# Patient Record
Sex: Female | Born: 1980 | Race: White | Hispanic: No | Marital: Single | Smoking: Never smoker
Health system: Southern US, Community
[De-identification: ages and names within clinical notes are randomized; demographics above are authoritative.]

## PROBLEM LIST (undated history)

## (undated) DIAGNOSIS — I10 Essential (primary) hypertension: Secondary | ICD-10-CM

## (undated) DIAGNOSIS — E119 Type 2 diabetes mellitus without complications: Secondary | ICD-10-CM

## (undated) HISTORY — PX: TONSILLECTOMY: SUR1361

---
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
## 2000-04-06 ENCOUNTER — Inpatient Hospital Stay (HOSPITAL_COMMUNITY): Admission: AD | Admit: 2000-04-06 | Discharge: 2000-04-06 | Payer: Self-pay | Admitting: *Deleted

## 2000-04-06 ENCOUNTER — Encounter (INDEPENDENT_AMBULATORY_CARE_PROVIDER_SITE_OTHER): Payer: Self-pay | Admitting: Specialist

## 2000-04-06 ENCOUNTER — Encounter: Payer: Self-pay | Admitting: *Deleted

## 2000-04-13 ENCOUNTER — Observation Stay (HOSPITAL_COMMUNITY): Admission: AD | Admit: 2000-04-13 | Discharge: 2000-04-14 | Payer: Self-pay | Admitting: *Deleted

## 2000-05-12 ENCOUNTER — Encounter (INDEPENDENT_AMBULATORY_CARE_PROVIDER_SITE_OTHER): Payer: Self-pay | Admitting: Specialist

## 2000-05-12 ENCOUNTER — Inpatient Hospital Stay (HOSPITAL_COMMUNITY): Admission: AD | Admit: 2000-05-12 | Discharge: 2000-05-16 | Payer: Self-pay | Admitting: *Deleted

## 2000-05-13 ENCOUNTER — Encounter: Payer: Self-pay | Admitting: *Deleted

## 2000-05-17 ENCOUNTER — Inpatient Hospital Stay (HOSPITAL_COMMUNITY): Admission: AD | Admit: 2000-05-17 | Discharge: 2000-05-17 | Payer: Self-pay | Admitting: *Deleted

## 2001-04-19 ENCOUNTER — Emergency Department (HOSPITAL_COMMUNITY): Admission: EM | Admit: 2001-04-19 | Discharge: 2001-04-19 | Payer: Self-pay | Admitting: Emergency Medicine

## 2002-10-31 ENCOUNTER — Encounter: Payer: Self-pay | Admitting: Emergency Medicine

## 2002-10-31 ENCOUNTER — Emergency Department (HOSPITAL_COMMUNITY): Admission: EM | Admit: 2002-10-31 | Discharge: 2002-11-01 | Payer: Self-pay | Admitting: *Deleted

## 2003-02-24 ENCOUNTER — Emergency Department (HOSPITAL_COMMUNITY): Admission: EM | Admit: 2003-02-24 | Discharge: 2003-02-24 | Payer: Self-pay | Admitting: Emergency Medicine

## 2004-07-13 ENCOUNTER — Other Ambulatory Visit: Admission: RE | Admit: 2004-07-13 | Discharge: 2004-07-13 | Payer: Self-pay | Admitting: Family Medicine

## 2004-12-20 ENCOUNTER — Emergency Department (HOSPITAL_COMMUNITY): Admission: EM | Admit: 2004-12-20 | Discharge: 2004-12-21 | Payer: Self-pay | Admitting: Emergency Medicine

## 2005-03-07 ENCOUNTER — Encounter: Admission: RE | Admit: 2005-03-07 | Discharge: 2005-03-07 | Payer: Self-pay | Admitting: Surgery

## 2005-03-16 ENCOUNTER — Encounter: Admission: RE | Admit: 2005-03-16 | Discharge: 2005-03-16 | Payer: Self-pay | Admitting: Surgery

## 2005-03-29 ENCOUNTER — Ambulatory Visit (HOSPITAL_COMMUNITY): Admission: RE | Admit: 2005-03-29 | Discharge: 2005-03-29 | Payer: Self-pay | Admitting: Gastroenterology

## 2005-07-06 ENCOUNTER — Ambulatory Visit (HOSPITAL_COMMUNITY): Admission: RE | Admit: 2005-07-06 | Discharge: 2005-07-06 | Payer: Self-pay | Admitting: Surgery

## 2005-07-06 ENCOUNTER — Encounter (INDEPENDENT_AMBULATORY_CARE_PROVIDER_SITE_OTHER): Payer: Self-pay | Admitting: Specialist

## 2005-11-25 ENCOUNTER — Emergency Department (HOSPITAL_COMMUNITY): Admission: EM | Admit: 2005-11-25 | Discharge: 2005-11-25 | Payer: Self-pay | Admitting: Emergency Medicine

## 2006-06-12 ENCOUNTER — Emergency Department (HOSPITAL_COMMUNITY): Admission: EM | Admit: 2006-06-12 | Discharge: 2006-06-12 | Payer: Self-pay | Admitting: Emergency Medicine

## 2006-07-23 ENCOUNTER — Emergency Department (HOSPITAL_COMMUNITY): Admission: EM | Admit: 2006-07-23 | Discharge: 2006-07-23 | Payer: Self-pay | Admitting: Emergency Medicine

## 2006-11-04 ENCOUNTER — Emergency Department (HOSPITAL_COMMUNITY): Admission: EM | Admit: 2006-11-04 | Discharge: 2006-11-04 | Payer: Self-pay | Admitting: Emergency Medicine

## 2006-11-17 IMAGING — RF DG CHOLANGIOGRAM OPERATIVE
1 series · 4 of 4 positions shown · non-contrast
Comparison: none

CLINICAL DATA: Gallstones.
 INTRAOPERATIVE CHOLANGIOGRAM ? 07/06/05:
TECHNIQUE: Multiple fluoroscopic spot radiographs were obtained during intraoperative cholangiogram, and are submitted for interpretation post-operatively.  47 C-arm fluoroscopically-guided spot images.

[Series 1: run · 4 of 47 frames shown]
[frame 2/47]
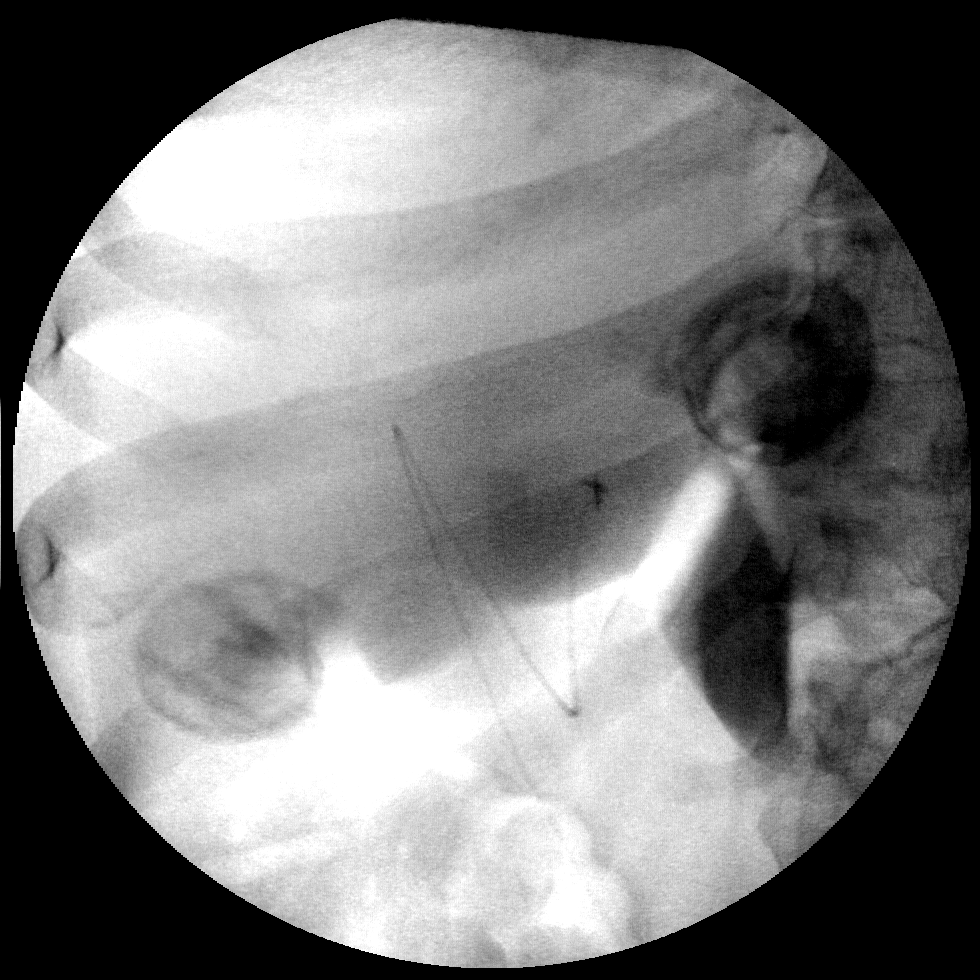
[frame 8/47]
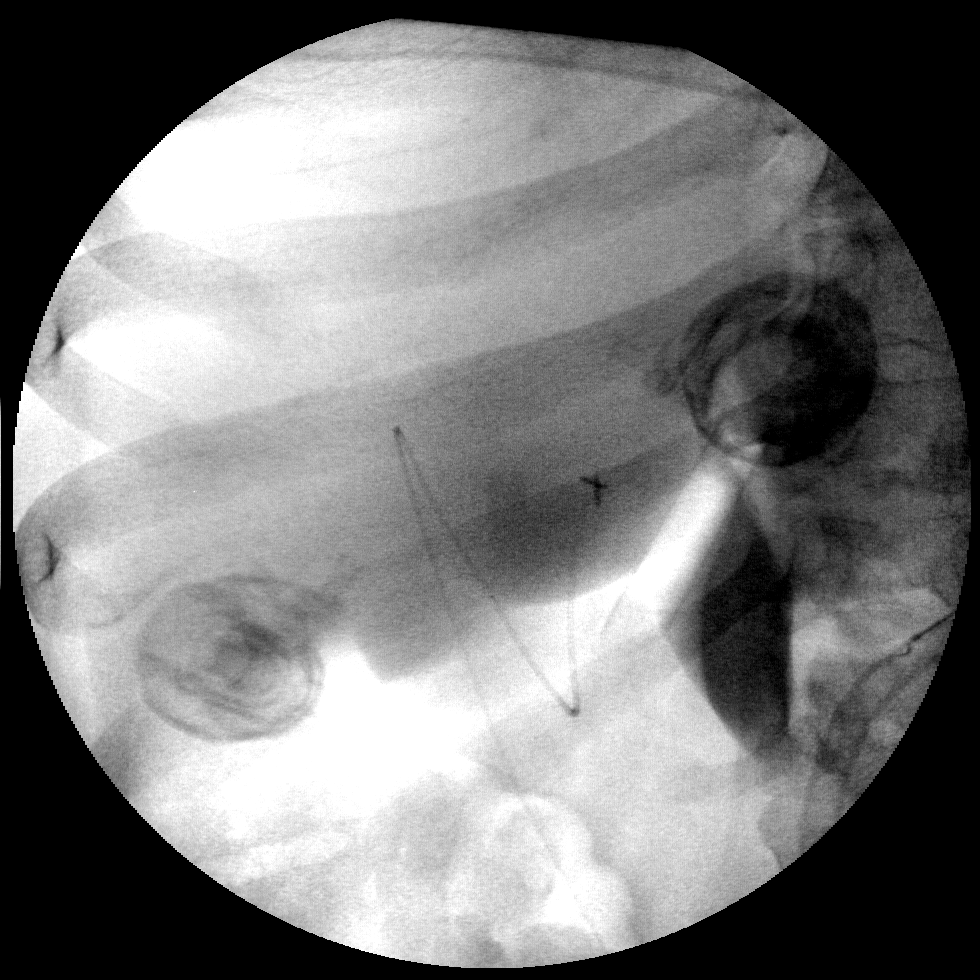
[frame 24/47]
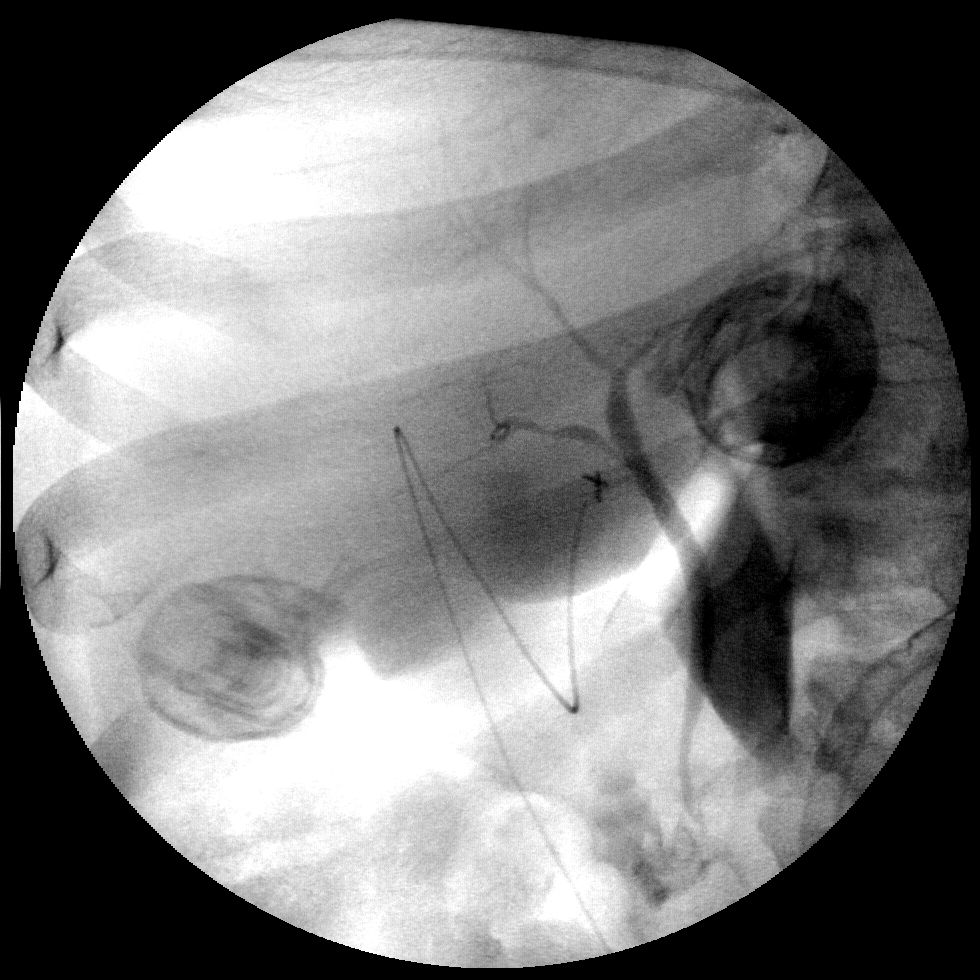
[frame 40/47]
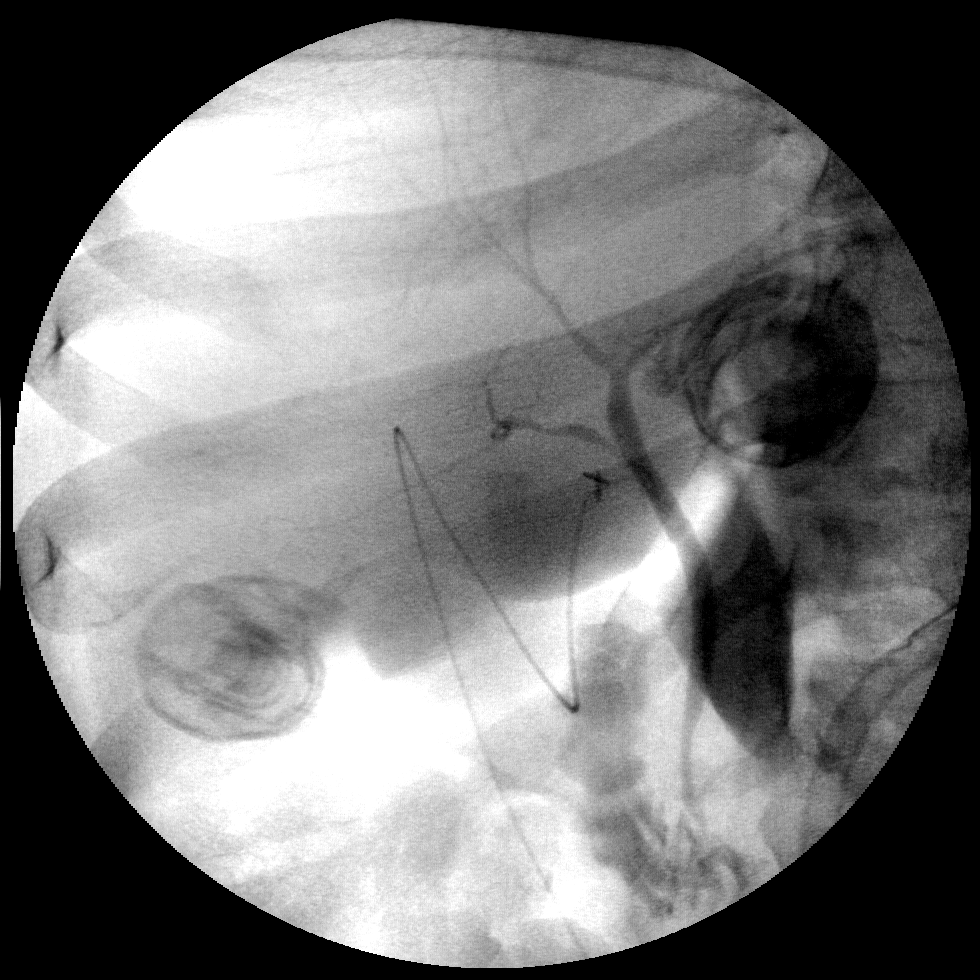

[4 of 4 positions shown; findings below may reference images not displayed]

FINDINGS: Contrast opacification of the biliary ducts reveals no ductal filling defects, stricture, or dislocation.  Contrast passes into the duodenum.
IMPRESSION: Negative operative cholangiogram.

## 2007-08-25 ENCOUNTER — Emergency Department (HOSPITAL_COMMUNITY): Admission: EM | Admit: 2007-08-25 | Discharge: 2007-08-26 | Payer: Self-pay | Admitting: Emergency Medicine

## 2008-11-19 ENCOUNTER — Emergency Department (HOSPITAL_COMMUNITY): Admission: EM | Admit: 2008-11-19 | Discharge: 2008-11-20 | Payer: Self-pay | Admitting: Emergency Medicine

## 2009-04-20 ENCOUNTER — Emergency Department (HOSPITAL_COMMUNITY): Admission: EM | Admit: 2009-04-20 | Discharge: 2009-04-20 | Payer: Self-pay | Admitting: Emergency Medicine

## 2009-07-01 ENCOUNTER — Emergency Department (HOSPITAL_COMMUNITY): Admission: EM | Admit: 2009-07-01 | Discharge: 2009-07-01 | Payer: Self-pay | Admitting: Emergency Medicine

## 2010-05-11 LAB — POCT PREGNANCY, URINE: Preg Test, Ur: NEGATIVE

## 2010-05-11 LAB — URINALYSIS, ROUTINE W REFLEX MICROSCOPIC
Bilirubin Urine: NEGATIVE
Glucose, UA: NEGATIVE mg/dL
Hgb urine dipstick: NEGATIVE
Ketones, ur: NEGATIVE mg/dL
Nitrite: NEGATIVE
Protein, ur: NEGATIVE mg/dL
Specific Gravity, Urine: 1.026 (ref 1.005–1.030)
Urobilinogen, UA: 1 mg/dL (ref 0.0–1.0)
pH: 7 (ref 5.0–8.0)

## 2010-05-11 LAB — URINE MICROSCOPIC-ADD ON

## 2010-06-23 NOTE — Op Note (Signed)
NAME:  Darlene Rodriguez, Darlene Rodriguez            ACCOUNT NO.:  [id number]   MEDICAL RECORD NO.:  [id number]          PATIENT TYPE:  AMB   LOCATION:  ENDO                         FACILITY:  [facility]   PHYSICIAN:  Shirley Friar, MDDATE OF BIRTH:  1980-07-22   DATE OF PROCEDURE:  03/29/2005  DATE OF DISCHARGE:                                 OPERATIVE REPORT   PROCEDURE PERFORMED:  Colonoscopy.   INDICATIONS FOR PROCEDURE:  Rectal bleeding.   MEDICATIONS:  Fentanyl 100 mcg IV, Versed 10 mg IV.   FINDINGS:  Rectal exam was normal.  An adult adjustable colonoscope was  inserted through a well prepped colon and advanced to the cecum where the  ileocecal valve and appendiceal orifice were identified.  The terminal ileum  was intubated and was normal in appearance.  On careful withdrawal the  colonoscope revealed no mucosal abnormalities including normal appearing  vascular supply, no polyps and no mucosal lesions seen.  Retroflexion showed  small internal hemorrhoids, otherwise normal colonoscopy.   ASSESSMENT:  1.  Small internal hemorrhoids.  Otherwise normal colonoscopy.  2.  Suspect rectal bleeding from anorectal source such as hemorrhoids versus      previously healed anal fissure.   PLAN:  1.  Anusol suppositories p.r.n.  2.  High fiber diet.      Shirley Friar, MD  Electronically Signed     VCS/MEDQ  D:  03/29/2005  T:  03/30/2005  Job:  [id number]   cc:   Wilmon Arms. Corliss Skains, M.D.  [address] [place] [address] [id number]  [place]

## 2010-06-23 NOTE — Op Note (Signed)
[facility]  Patient:    Darlene Rodriguez, Darlene Rodriguez                   MRN: [id number] Proc. Date: 05/13/00 Adm. Date:  [id number] Attending:  Michaelle Copas CC:         Jamey Reas, M.D.   Operative Report  PREOPERATIVE DIAGNOSES:       1. A [redacted] week gestation.                               2. Severe abdominal pain.                               3. Breech presentation.                               4. Maternal and fetal tachycardia.                               5. Rule out abruption.  POSTOPERATIVE DIAGNOSES:      1. A [redacted] week gestation.                               2. Severe abdominal pain.                               3. Breech presentation.                               4. Maternal and fetal tachycardia.                               5. No evidence of abruption.  PROCEDURE:                    Primary low transverse cesarean section.  SURGEON:                      Charles A. Clearance Coots, M.D.  ASSISTANT:                    Jamey Reas, M.D.  ANESTHESIA:                   Spinal.  ESTIMATED BLOOD LOSS:         800 ml.  IV FLUIDS:                    2800 ml lactated Ringers.  URINE OUTPUT:                 750 ml clear.  COMPLICATIONS:                None.  DRAINS:                       Foley to gravity.  FINDINGS:                     Viable female at 1341 with Apgars of 6 at one minute  and 7 at five minutes.  Weight 2003 g.  Normal uterus, ovaries and fallopian tubes.  Cord pH 7.32.  DESCRIPTION OF PROCEDURE:     The patient was brought to the operating room and, after satisfactory spinal anesthesia, the abdomen was prepped and draped in the usual sterile fashion.  A Pfannenstiel skin incision was made with a scalpel.  It was deepened down to the fascia with the scalpel.  The fascia was nicked in the midline.  The facial incision was extended to the left and to the right with curved Mayo scissors.  The superior and  inferior fascial edges were taken off of the rectus muscle with both blunt and sharp dissection.  the rectus muscle was bluntly and sharply divided in the midline superiorly and inferiorly, being careful to avoid the urinary bladder.  The peritoneum was then grasped with forceps and was incised with Metzenbaum scissors.  The peritoneal incision was then digitally extended to the left and to the right with traction.   A bladder blade was positioned and the vesicouterine fold of the peritoneum above the reflection of the urinary bladder was grasped with forceps and was incised and undermined with Metzenbaum scissors.  The incision was extended to the left and to the right with Metzenbaum scissors.  A bladder flap was bluntly developed and the bladder blade was repositioned in front of the urinary bladder, placing it well out of the operative field.  The uterus was entered with sharp strokes of the scalpel down to the amniotic sac.  The uterine incision was then digitally extended to the left and to the right. The amniotic sac was ruptured with a forceps.  Delivery was then accomplished, a frank breech delivery, in routine fashion without complications.  The infants mouth and nose were suctioned with a suction bulb.  The umbilical cord was doubly clamped and cut and the infant was handed off to the nursery staff.  Cord pH and cord blood were obtained.  The placenta was spontaneously removed from the uterine cavity intact.  The uterus was exteriorized and the endometrial surface was thoroughly debrided with a dry lap sponge.  The edges of the uterine incision were grasped with a ring forceps.  The cervix was dilated with the ring forceps.  The uterus was closed with continuous interlocking suture of 0 Monocryl from each corner to the center.  Hemostasis was excellent.  The uterus was then placed back in its normal anatomic position.  An attempt was made to identify the appendix, but I could  not locate the appendix.  The bowel, both the small and large intestines that were observed, were normal in appearance.  The pelvic cavity was thoroughly irrigated with warm saline solution and all clots were removed.  Closure of the uterus was again observed for hemostasis and there was no active bleeding noted.  The abdomen was then closed as follows.  The fascia was closed with continuous suture of 0 Panacryl from each corner to the center.  The subcutaneous tissue was thoroughly irrigated with warm saline solution.  All areas of subcutaneous bleeding were coagulated with the Bovie.  The subcutaneous tissue was then approximated with a continuous suture of 0 plain catgut.  The skin was approximated with surgical stainless steel staples.  A sterile bandage was applied to the incision closure.  The surgical technician indicted that all sponge, needle and instrument counts were correct.  The patient tolerated the procedure well and was transported to the recovery room in  satisfactory condition. DD:  05/13/00 TD:  05/13/00 Job: [id number]

## 2010-06-23 NOTE — Op Note (Signed)
NAME:  Darlene Rodriguez, Darlene Rodriguez            ACCOUNT NO.:  [id number]   MEDICAL RECORD NO.:  [id number]          PATIENT TYPE:  AMB   LOCATION:  DAY                          FACILITY:  [facility]   PHYSICIAN:  Wilmon Arms. Corliss Skains, M.D. DATE OF BIRTH:  January 30, 1981   DATE OF PROCEDURE:  07/06/2005  DATE OF DISCHARGE:                                 OPERATIVE REPORT   PREOPERATIVE DIAGNOSIS:  Chronic acalculous cholecystitis.   POSTOPERATIVE DIAGNOSIS:  Chronic acalculous cholecystitis.   PROCEDURE PERFORMED:  Laparoscopic cholecystectomy with interoperative  cholangiogram.   SURGEON:  Wilmon Arms. Corliss Skains, M.D.   ASSISTANTSheppard Plumber. Earlene Plater, M.D.   ANESTHESIA:  General endotracheal.   INDICATIONS:  The patient is a 30 year old female with morbid obesity who  presents with persistent abdominal pain in her right upper quadrant  radiating through to her back.  A CT scan was previously performed by her  OB/GYN.  This showed only a lower midline abdominal wall hernia.  This was  in the area of a previous C-section.  The patient has no symptoms regarding  her hernia.  She continues to gain weight.  An ultrasound was then obtained  which showed no evidence of cholelithiasis.  A HIDA scan showed a mildly  decreased gallbladder ejection fraction.  She was also seen by Dr. Charlott Rakes for some hematochezia.  This was felt to be due to hemorrhoids.  The remainder of her colonoscopy was normal.  She presents for elective  cholecystectomy.   DESCRIPTION OF PROCEDURE:  The patient was brought to the operating room and  placed in the supine position on the operating table.  After an adequate  level of general endotracheal anesthesia was obtained, the patient's abdomen  was prepped with Betadine and draped in sterile fashion.  A time-out was  taken to assure the proper patient and proper procedure.  A vertical  incision was made just above her umbilicus.  A 10 mm OptiVu port was  advanced through the large  amount of subcutaneous fat under direct vision  with the laparoscope.  The peritoneal cavity was entered.  Pneumoperitoneum  was then obtained by insufflating CO2 maintaining a maximal pressure of 15  mmHg.  A 10 mm port was placed in the subxiphoid position.  The scope was  inserted and the initial periumbilical port was visualized.  There was some  adhesions around the lower midline hernia below the umbilicus but there were  no adhesions around the port insertion site.  The scope was then moved back  to the periumbilical port site.  Two 5 mm ports were placed in the right  upper quadrant.  The gallbladder was grasped, clamped, and elevated off the  edge of the liver.  The peritoneum around the gallbladder was opened and the  cystic duct was circumferentially dissected.  It was ligated and clipped  distally.  A small hole was made in the cystic duct and a Cook cholangiogram  catheter was threaded into the cystic duct and secured with a clip.  The  cholangiogram was obtained which showed good flow proximally and distally in  the biliary  tree with no evidence of filling defects.  There did seem to be  a small proximal accessory bile duct but this was well above the cystic  duct/common duct junction.  The cholangiogram catheter was then removed.  The cystic duct was ligated with clips and divided.  The cystic artery was  also ligated with clips and divided.  Cautery was used to dissect the  gallbladder free from the liver bed.  The gallbladder was then placed in an  EndoCatch sac.  The draw-string was pulled closed and the bag was pulled up  into the subxiphoid wound.  We were able to visualize the tip of the  gallbladder which was opened with scissors.  The suction irrigator was  inserted in the gallbladder and the bile was suctioned out.  This allowed  easy removal of the gallbladder and bag.  The ports were then reinserted.  The gallbladder fossa was then thoroughly irrigated.  Hemostasis  was  obtained with cautery.  No bile leak was noted.  Pneumoperitoneum was then  released and the ports were removed under direct vision.  4-0 Monocryl was  used to close the skin incision in a subcuticular fashion.  Steri-Strips and  clean dressings were applied.  The patient was then extubated and brought to  the recovery room in stable condition.  All sponge, instrument, and needle  counts were correct.      Wilmon Arms. Tsuei, M.D.  Electronically Signed     MKT/MEDQ  D:  07/06/2005  T:  07/06/2005  Job:  [id number]   cc:   Shirley Friar, MD  Fax: [phone]   Gerald Leitz, MD

## 2010-06-23 NOTE — Discharge Summary (Signed)
[facility]  Patient:    Darlene Rodriguez, Darlene Rodriguez                   MRN: [id number] Adm. Date:  [id number] Disc. Date: [id number] Attending:  Michaelle Copas Dictator:   Maryelizabeth Rowan, M.D.                           Discharge Summary  PRIMARY DIAGNOSES:            1. Vaginal bleeding.                               2. Abdominal pain.                               3. Intrauterine pregnancy at 33-2/7 weeks.  HOSPITAL COURSE:              This patient was admitted complaining of severe abdominal pain and vaginal bleeding. A low transverse cesarean section was performed on hospital day #2 secondary to persistent abdominal pain, fetal tachycardia, and maternal tachycardia. There was high clinical suspicion for abruption. Therefore, patient was taken to the OR.  Please see operators report. There was no abruption on the low transverse cesarean section performed.  Postoperative ______ was uneventful for mother. She delivered a viable female infant that was sent to the NICU.  Patient was found to have antiphospholipid antibody but no evidence of thrombosis during this pregnancy. Patient was discharged home in stable condition with instructions for activity to be restricted with pelvic rest for six weeks.  DISCHARGE MEDICATIONS:        Prenatal vitamins, Ibuprofen, and Percocet.  DISCHARGE FOLLOWUP:           The patient is to follow up in six weeks at [facility]. DD:  08/09/00 TD:  08/09/00 Job: [id number]

## 2010-08-18 ENCOUNTER — Emergency Department (HOSPITAL_COMMUNITY)
Admission: EM | Admit: 2010-08-18 | Discharge: 2010-08-18 | Disposition: A | Discharge status: Home / Self Care | Payer: Medicaid Other | Attending: Emergency Medicine | Admitting: Emergency Medicine

## 2010-08-18 DIAGNOSIS — L0201 Cutaneous abscess of face: Secondary | ICD-10-CM | POA: Insufficient documentation

## 2010-08-18 DIAGNOSIS — N63 Unspecified lump in unspecified breast: Secondary | ICD-10-CM | POA: Insufficient documentation

## 2010-08-18 DIAGNOSIS — L03211 Cellulitis of face: Secondary | ICD-10-CM | POA: Insufficient documentation

## 2010-08-18 DIAGNOSIS — N6320 Unspecified lump in the left breast, unspecified quadrant: Secondary | ICD-10-CM

## 2010-08-18 DIAGNOSIS — N644 Mastodynia: Secondary | ICD-10-CM | POA: Insufficient documentation

## 2010-08-18 DIAGNOSIS — N6310 Unspecified lump in the right breast, unspecified quadrant: Secondary | ICD-10-CM

## 2010-08-18 DIAGNOSIS — IMO0002 Reserved for concepts with insufficient information to code with codable children: Secondary | ICD-10-CM | POA: Insufficient documentation

## 2010-08-22 ENCOUNTER — Other Ambulatory Visit: Payer: Self-pay | Admitting: Emergency Medicine

## 2010-08-22 ENCOUNTER — Ambulatory Visit: Admit: 2010-08-22 | Payer: Medicaid Other

## 2010-08-22 ENCOUNTER — Ambulatory Visit
Admission: RE | Admit: 2010-08-22 | Discharge: 2010-08-22 | Disposition: A | Discharge status: Home / Self Care | Payer: Medicaid Other | Source: Ambulatory Visit | Attending: Emergency Medicine | Admitting: Emergency Medicine

## 2010-08-22 DIAGNOSIS — N6310 Unspecified lump in the right breast, unspecified quadrant: Secondary | ICD-10-CM

## 2010-08-22 DIAGNOSIS — N6320 Unspecified lump in the left breast, unspecified quadrant: Secondary | ICD-10-CM

## 2010-11-03 LAB — URINALYSIS, ROUTINE W REFLEX MICROSCOPIC
Bilirubin Urine: NEGATIVE
Glucose, UA: NEGATIVE
Hgb urine dipstick: NEGATIVE
Ketones, ur: NEGATIVE
Nitrite: NEGATIVE
Protein, ur: NEGATIVE
Specific Gravity, Urine: 1.027
Urobilinogen, UA: 1
pH: 6

## 2010-11-03 LAB — BASIC METABOLIC PANEL
Chloride: 104
Creatinine, Ser: 0.59
GFR calc Af Amer: >60
Potassium: 3.6

## 2010-11-03 LAB — GC/CHLAMYDIA PROBE AMP, GENITAL
Chlamydia, DNA Probe: NEGATIVE
GC Probe Amp, Genital: NEGATIVE

## 2010-11-03 LAB — CBC
HCT: 39.5
MCV: 89.9
RBC: 4.4
WBC: 9

## 2010-11-03 LAB — WET PREP, GENITAL
Trich, Wet Prep: NONE SEEN
WBC, Wet Prep HPF POC: NONE SEEN
Yeast Wet Prep HPF POC: NONE SEEN

## 2010-11-03 LAB — DIFFERENTIAL
Eosinophils Absolute: 0
Lymphs Abs: 0.9
Monocytes Relative: 7
Neutrophils Relative %: 83 — ABNORMAL HIGH

## 2010-11-03 LAB — POCT PREGNANCY, URINE
Operator id: 277751
Preg Test, Ur: NEGATIVE

## 2010-11-16 LAB — COMPREHENSIVE METABOLIC PANEL
ALT: 20
Alkaline Phosphatase: 97
CO2: 24
Chloride: 100
GFR calc non Af Amer: >60
Glucose, Bld: 109 — ABNORMAL HIGH
Potassium: 3.6
Sodium: 136
Total Bilirubin: 0.9

## 2010-11-16 LAB — WET PREP, GENITAL
Clue Cells Wet Prep HPF POC: NONE SEEN
Trich, Wet Prep: NONE SEEN
WBC, Wet Prep HPF POC: NONE SEEN
Yeast Wet Prep HPF POC: NONE SEEN

## 2010-11-16 LAB — DIFFERENTIAL
Basophils Absolute: 0
Basophils Relative: 0
Eosinophils Absolute: 0
Eosinophils Relative: 0
Lymphocytes Relative: 6 — ABNORMAL LOW
Lymphs Abs: 0.6 — ABNORMAL LOW
Monocytes Absolute: 0.4
Monocytes Relative: 4
Neutro Abs: 8 — ABNORMAL HIGH
Neutrophils Relative %: 89 — ABNORMAL HIGH

## 2010-11-16 LAB — URINALYSIS, ROUTINE W REFLEX MICROSCOPIC
Glucose, UA: NEGATIVE
Hgb urine dipstick: NEGATIVE
Protein, ur: NEGATIVE

## 2010-11-16 LAB — COMPREHENSIVE METABOLIC PANEL WITH GFR
AST: 20
Albumin: 3.8
BUN: 9
Calcium: 8.9
Creatinine, Ser: 0.64
GFR calc Af Amer: >60
Total Protein: 7

## 2010-11-16 LAB — CBC
HCT: 41.2
Hemoglobin: 14.1
MCHC: 34.3
MCV: 88.3
Platelets: 271
RBC: 4.66
RDW: 13
WBC: 8.9

## 2010-11-16 LAB — POCT PREGNANCY, URINE
Operator id: 285841
Preg Test, Ur: NEGATIVE

## 2010-11-16 LAB — LIPASE, BLOOD: Lipase: 16

## 2010-11-16 LAB — D-DIMER, QUANTITATIVE: D-Dimer, Quant: 0.34

## 2012-09-05 ENCOUNTER — Emergency Department (HOSPITAL_COMMUNITY)
Admission: EM | Admit: 2012-09-05 | Discharge: 2012-09-05 | Disposition: A | Discharge status: Home / Self Care | Payer: Self-pay | Attending: Emergency Medicine | Admitting: Emergency Medicine

## 2012-09-05 ENCOUNTER — Encounter (HOSPITAL_COMMUNITY): Payer: Self-pay | Admitting: Emergency Medicine

## 2012-09-05 DIAGNOSIS — R059 Cough, unspecified: Secondary | ICD-10-CM | POA: Insufficient documentation

## 2012-09-05 DIAGNOSIS — R05 Cough: Secondary | ICD-10-CM | POA: Insufficient documentation

## 2012-09-05 DIAGNOSIS — L539 Erythematous condition, unspecified: Secondary | ICD-10-CM | POA: Insufficient documentation

## 2012-09-05 DIAGNOSIS — M542 Cervicalgia: Secondary | ICD-10-CM | POA: Insufficient documentation

## 2012-09-05 DIAGNOSIS — H9209 Otalgia, unspecified ear: Secondary | ICD-10-CM | POA: Insufficient documentation

## 2012-09-05 DIAGNOSIS — J329 Chronic sinusitis, unspecified: Secondary | ICD-10-CM

## 2012-09-05 DIAGNOSIS — J3489 Other specified disorders of nose and nasal sinuses: Secondary | ICD-10-CM | POA: Insufficient documentation

## 2012-09-05 DIAGNOSIS — I10 Essential (primary) hypertension: Secondary | ICD-10-CM | POA: Insufficient documentation

## 2012-09-05 DIAGNOSIS — R509 Fever, unspecified: Secondary | ICD-10-CM | POA: Insufficient documentation

## 2012-09-05 HISTORY — DX: Essential (primary) hypertension: I10

## 2012-09-05 MED ORDER — FLUTICASONE PROPIONATE 50 MCG/ACT NA SUSP: 2.0000 | Freq: Every day | NASAL | Status: DC

## 2012-09-05 MED ORDER — AMOXICILLIN-POT CLAVULANATE 875-125 MG PO TABS: 1.0000 | ORAL_TABLET | Freq: Two times a day (BID) | ORAL | Status: DC

## 2012-09-05 NOTE — ED Provider Notes (Signed)
CSN: [id number]     Arrival date & time 09/05/12  1937 History  This chart was scribed for non-physician practitioner Rhea Bleacher, PA-C, working with Gilda Crease, by Yevette Edwards, ED Scribe. This patient was seen in room TR07C/TR07C and the patient's care was started at 8:50 PM.   First MD Initiated Contact with Patient 09/05/12 2002     Chief Complaint  Patient presents with  . Sore Throat    The history is provided by the patient. No language interpreter was used.   HPI Comments: Darlene Rodriguez is a 32 y.o. female, with a h/o sinus infections, who presents to the Emergency Department complaining of a gradually-increasing sore throat which worsened this morning. She states that for the past three weeks she has experienced a constant sinus infection involving headaches, sinus pressure, otalgia, and rhinorrhea. She has also experienced intermittent fevers, a mildly sore neck, and a non-productive, dry cough. The pt denies experiencing any nausea or emesis. She has attempted to alleviate her symptoms with Claritin, Mucinex, tylenol, but with little resolution. The pt states that with previous sinus infections she has received antibiotics. She also reports recent sick contacts associated with strep. The onset of this condition is acute. The course is constant. The aggravating factors are none. The alleviating factors are none.   Past Medical History  Diagnosis Date  . Hypertension    Past Surgical History  Procedure Laterality Date  . Cesarean section     History reviewed. No pertinent family history. History  Substance Use Topics  . Smoking status: Never Smoker   . Smokeless tobacco: Not on file  . Alcohol Use: No   No OB history provided.  Review of Systems  Constitutional: Positive for fever.  HENT: Positive for ear pain, congestion, sore throat, rhinorrhea, neck pain (Mild ) and sinus pressure.   Eyes: Negative for redness.  Respiratory: Positive for cough.    Gastrointestinal: Negative for nausea and vomiting.  Neurological: Negative for headaches.    Allergies  Review of patient's allergies indicates no known allergies.  Home Medications   Current Outpatient Rx  Name  Route  Sig  Dispense  Refill  . amoxicillin-clavulanate (AUGMENTIN) 875-125 MG per tablet   Oral   Take 1 tablet by mouth every 12 (twelve) hours.   14 tablet   0   . fluticasone (FLONASE) 50 MCG/ACT nasal spray   Nasal   Place 2 sprays into the nose daily.   16 g   0     Triage Vitals: BP 165/118  Pulse 98  Temp(Src) 98.9 F (37.2 C) (Oral)  Resp 18  SpO2 100%  Physical Exam  Nursing note and vitals reviewed. Constitutional: She appears well-developed and well-nourished.  HENT:  Head: Normocephalic and atraumatic. No trismus in the jaw.  Right Ear: Tympanic membrane, external ear and ear canal normal.  Left Ear: Tympanic membrane, external ear and ear canal normal.  Nose: Mucosal edema and rhinorrhea present. Right sinus exhibits maxillary sinus tenderness. Right sinus exhibits no frontal sinus tenderness. Left sinus exhibits maxillary sinus tenderness. Left sinus exhibits no frontal sinus tenderness.  Mouth/Throat: Uvula is midline, oropharynx is clear and moist and mucous membranes are normal. Mucous membranes are not dry. No oral lesions. No edematous. No oropharyngeal exudate, posterior oropharyngeal edema, posterior oropharyngeal erythema or tonsillar abscesses.  Eyes: Conjunctivae are normal. Right eye exhibits no discharge. Left eye exhibits no discharge.  Neck: Normal range of motion. Neck supple.  Cardiovascular: Normal rate, regular  rhythm and normal heart sounds.   Pulmonary/Chest: Effort normal and breath sounds normal. No respiratory distress. She has no wheezes. She has no rales.  Abdominal: Soft. There is no tenderness.  Lymphadenopathy:    She has no cervical adenopathy.  Neurological: She is alert.  Skin: Skin is warm and dry.   Psychiatric: She has a normal mood and affect.    ED Course   DIAGNOSTIC STUDIES:  Oxygen Saturation is 100% on room air, normal by my interpretation.    COORDINATION OF CARE:  8:55 PM- Discussed treatment plan with patient, and the patient agreed to the plan.   Procedures (including critical care time)  Labs Reviewed  RAPID STREP SCREEN  CULTURE, GROUP A STREP   No results found. No diagnosis found.  Vital signs reviewed and are as follows: Filed Vitals:   09/05/12 1940  BP: 165/118  Pulse: 98  Temp: 98.9 F (37.2 C)  Resp: 18     MDM  Patient with sinusitis. Will give course of antibiotics as patient has had symptoms for greater than 10 days. She appears well, nontoxic. Discussed other symptomatic measures.  I personally performed the services described in this documentation, which was scribed in my presence. The recorded information has been reviewed and is accurate.    Renne Crigler, PA-C 09/05/12 2117

## 2012-09-05 NOTE — ED Notes (Signed)
C/o sinus infection 3weeks to 1 month. Recurrent fever. Tmax 102. Presents today for increasing sore throat. "hurts to swallow"

## 2012-09-06 NOTE — ED Provider Notes (Signed)
Medical screening examination/treatment/procedure(s) were performed by non-physician practitioner and as supervising physician I was immediately available for consultation/collaboration.   Christopher J. Pollina, MD 09/06/12 1753 

## 2012-09-07 LAB — CULTURE, GROUP A STREP

## 2013-08-29 ENCOUNTER — Encounter (HOSPITAL_COMMUNITY): Payer: Self-pay | Admitting: Emergency Medicine

## 2013-08-29 ENCOUNTER — Emergency Department (HOSPITAL_COMMUNITY)
Admission: EM | Admit: 2013-08-29 | Discharge: 2013-08-29 | Disposition: A | Discharge status: Home / Self Care | Payer: BC Managed Care – PPO | Source: Home / Self Care | Attending: Family Medicine | Admitting: Family Medicine

## 2013-08-29 DIAGNOSIS — J0100 Acute maxillary sinusitis, unspecified: Secondary | ICD-10-CM

## 2013-08-29 DIAGNOSIS — J0101 Acute recurrent maxillary sinusitis: Secondary | ICD-10-CM

## 2013-08-29 MED ORDER — FLUTICASONE PROPIONATE 50 MCG/ACT NA SUSP: 1.0000 | Freq: Two times a day (BID) | NASAL | Status: DC

## 2013-08-29 MED ORDER — DOXYCYCLINE HYCLATE 100 MG PO CAPS
100.0000 mg | ORAL_CAPSULE | Freq: Two times a day (BID) | ORAL | Status: DC
Start: 2013-08-29 — End: 2014-02-05

## 2013-08-29 NOTE — ED Provider Notes (Signed)
CSN: [id number]     Arrival date & time 08/29/13  1042 History   First MD Initiated Contact with Patient 08/29/13 1050     Chief Complaint  Patient presents with  . URI   (Consider location/radiation/quality/duration/timing/severity/associated sxs/prior Treatment) Patient is a 33 y.o. female presenting with URI. The history is provided by the patient.  URI Presenting symptoms: congestion, cough, ear pain, facial pain, fever, rhinorrhea and sore throat   Severity:  Mild Onset quality:  Gradual Duration:  2 weeks Chronicity:  New Worsened by:  Nothing tried Ineffective treatments:  None tried Associated symptoms: sinus pain   Associated symptoms: no wheezing     Past Medical History  Diagnosis Date  . Hypertension    Past Surgical History  Procedure Laterality Date  . Cesarean section     History reviewed. No pertinent family history. History  Substance Use Topics  . Smoking status: Never Smoker   . Smokeless tobacco: Not on file  . Alcohol Use: No   OB History   Grav Para Term Preterm Abortions TAB SAB Ect Mult Living                 Review of Systems  Constitutional: Positive for fever.  HENT: Positive for congestion, ear pain, postnasal drip, rhinorrhea and sore throat.   Respiratory: Positive for cough. Negative for wheezing.   Cardiovascular: Negative.     Allergies  Review of patient's allergies indicates no known allergies.  Home Medications   Prior to Admission medications   Medication Sig Start Date End Date Taking? Authorizing Provider  amoxicillin-clavulanate (AUGMENTIN) 875-125 MG per tablet Take 1 tablet by mouth every 12 (twelve) hours. 09/05/12   Renne CriglerJoshua Geiple, PA-C  doxycycline (VIBRAMYCIN) 100 MG capsule Take 1 capsule (100 mg total) by mouth 2 (two) times daily. 08/29/13   Linna HoffJames D Barclay Lennox, MD  fluticasone (FLONASE) 50 MCG/ACT nasal spray Place 2 sprays into the nose daily. 09/05/12   Renne CriglerJoshua Geiple, PA-C  fluticasone (FLONASE) 50 MCG/ACT nasal spray  Place 1 spray into both nostrils 2 (two) times daily. 08/29/13   Linna HoffJames D Kamir Selover, MD   BP 170/118  Pulse 112  Temp(Src) 98 F (36.7 C) (Oral)  SpO2 97%  LMP 08/14/2013 Physical Exam  Nursing note and vitals reviewed. Constitutional: She is oriented to person, place, and time. She appears well-developed and well-nourished.  HENT:  Head: Normocephalic.  Right Ear: External ear normal.  Left Ear: External ear normal.  Nose: Mucosal edema and rhinorrhea present. Right sinus exhibits no frontal sinus tenderness. Left sinus exhibits no frontal sinus tenderness.  Mouth/Throat: Oropharynx is clear and moist.  Eyes: Pupils are equal, round, and reactive to light.  Neck: Normal range of motion. Neck supple.  Cardiovascular: Normal heart sounds.   Pulmonary/Chest: Effort normal and breath sounds normal.  Lymphadenopathy:    She has no cervical adenopathy.  Neurological: She is alert and oriented to person, place, and time.  Skin: Skin is warm and dry.    ED Course  Procedures (including critical care time) Labs Review Labs Reviewed - No data to display  Imaging Review No results found.   MDM   1. Acute recurrent maxillary sinusitis        Linna HoffJames D Nolton Denis, MD 08/29/13 1124

## 2013-08-29 NOTE — ED Notes (Signed)
Pt  Reports       Symptoms         Of     Sinus  Pressure  And  sorethroat      With  A  Cough          With  Symptoms    X  2  Days

## 2013-12-13 ENCOUNTER — Encounter (HOSPITAL_COMMUNITY): Payer: Self-pay | Admitting: *Deleted

## 2013-12-13 ENCOUNTER — Emergency Department (HOSPITAL_COMMUNITY)
Admission: EM | Admit: 2013-12-13 | Discharge: 2013-12-13 | Disposition: A | Discharge status: Home / Self Care | Payer: BC Managed Care – PPO | Attending: Emergency Medicine | Admitting: Emergency Medicine

## 2013-12-13 DIAGNOSIS — Z79899 Other long term (current) drug therapy: Secondary | ICD-10-CM | POA: Insufficient documentation

## 2013-12-13 DIAGNOSIS — R030 Elevated blood-pressure reading, without diagnosis of hypertension: Secondary | ICD-10-CM

## 2013-12-13 DIAGNOSIS — I10 Essential (primary) hypertension: Secondary | ICD-10-CM | POA: Insufficient documentation

## 2013-12-13 DIAGNOSIS — H109 Unspecified conjunctivitis: Secondary | ICD-10-CM

## 2013-12-13 DIAGNOSIS — Z7951 Long term (current) use of inhaled steroids: Secondary | ICD-10-CM | POA: Insufficient documentation

## 2013-12-13 DIAGNOSIS — Z792 Long term (current) use of antibiotics: Secondary | ICD-10-CM | POA: Insufficient documentation

## 2013-12-13 DIAGNOSIS — H1033 Unspecified acute conjunctivitis, bilateral: Secondary | ICD-10-CM | POA: Insufficient documentation

## 2013-12-13 DIAGNOSIS — J0100 Acute maxillary sinusitis, unspecified: Secondary | ICD-10-CM | POA: Insufficient documentation

## 2013-12-13 DIAGNOSIS — J029 Acute pharyngitis, unspecified: Secondary | ICD-10-CM | POA: Insufficient documentation

## 2013-12-13 LAB — RAPID STREP SCREEN (MED CTR MEBANE ONLY): Streptococcus, Group A Screen (Direct): NEGATIVE

## 2013-12-13 MED ORDER — SALINE SPRAY 0.65 % NA SOLN: 1.0000 | NASAL | Status: DC | PRN

## 2013-12-13 MED ORDER — AMOXICILLIN-POT CLAVULANATE 875-125 MG PO TABS: 1.0000 | ORAL_TABLET | Freq: Two times a day (BID) | ORAL | Status: DC

## 2013-12-13 MED ORDER — OXYMETAZOLINE HCL 0.05 % NA SOLN: 1.0000 | Freq: Two times a day (BID) | NASAL | Status: DC

## 2013-12-13 MED ORDER — ERYTHROMYCIN 5 MG/GM OP OINT: TOPICAL_OINTMENT | OPHTHALMIC | Status: DC

## 2013-12-13 MED ORDER — DEXTROMETHORPHAN-GUAIFENESIN 10-100 MG/5ML PO LIQD: 10.0000 mL | ORAL | Status: DC | PRN

## 2013-12-13 NOTE — ED Notes (Signed)
Declined W/C at D/C and was escorted to lobby by RN. 

## 2013-12-13 NOTE — ED Notes (Signed)
Pt reports since Tues her throat has been sore ,drainage from both eyes and a possible sinus infection.Pt also reports fever at home.

## 2013-12-13 NOTE — Discharge Instructions (Signed)
Call for a follow up appointment with a Family or Primary Care Provider for further evaluation of your symptoms and your elevated blood pressure reading.  Call an eye specialist for further evaluation of your eye discomfort. Return if Symptoms worsen.   Take medication as prescribed.   Emergency Department Resource Guide 1) Find a Doctor and Pay Out of Pocket Although you won't have to find out who is covered by your insurance plan, it is a good idea to ask around and get recommendations. You will then need to call the office and see if the doctor you have chosen will accept you as a new patient and what types of options they offer for patients who are self-pay. Some doctors offer discounts or will set up payment plans for their patients who do not have insurance, but you will need to ask so you aren't surprised when you get to your appointment.  2) Contact Your Local Health Department Not all health departments have doctors that can see patients for sick visits, but many do, so it is worth a call to see if yours does. If you don't know where your local health department is, you can check in your phone book. The CDC also has a tool to help you locate your state's health department, and many state websites also have listings of all of their local health departments.  3) Find a Walk-in Clinic If your illness is not likely to be very severe or complicated, you may want to try a walk in clinic. These are popping up all over the country in pharmacies, drugstores, and shopping centers. They're usually staffed by nurse practitioners or physician assistants that have been trained to treat common illnesses and complaints. They're usually fairly quick and inexpensive. However, if you have serious medical issues or chronic medical problems, these are probably not your best option.  No Primary Care Doctor: - Call Health Connect at  [phone] - they can help you locate a primary care doctor that  accepts your  insurance, provides certain services, etc. - Physician Referral Service- [phone]  Chronic Pain Problems: Organization         Address  Phone   Notes  [facility]  [phone] Patients need to be referred by their primary care doctor.   Medication Assistance: Organization         Address  Phone   Notes  [facility] Medication [facility] [address] [place], [place] [address] [phone] --Must be a resident of [place] -- Must have NO insurance coverage whatsoever (no Medicaid/ Medicare, etc.) -- The pt. MUST have a primary care doctor that directs their care regularly and follows them in the community   MedAssist  [phone]   [employer]  [phone]    Agencies that provide inexpensive medical care: Organization         Address  Phone   Notes  [facility]  [phone]   [facility]    [phone]   [facility] [address] [place], [place] [address] [phone]   [facility] [address], [place] [phone]   [facility]anned [facility]    [phone]   [facility]    [phone]   [facility]  [address], [place] Phone:  [phone], Fax:  [phone] Hours of Operation:  9 am - 6  pm, M-F.  Also accepts Medicaid/Medicare and self-pay.  [facility] for [address] [place], [address], [place] Phone: [phone], Fax: [phone]. Hours of Operation:  8:30 am - 5:30 pm, M-F.  Also accepts Medicaid and self-pay.  [facility] [address] [place] Phone: [phone]   [place], [place], [place] [phone], Ext. [phone] Mondays & Thursdays: 7-9 AM.  First 15 patients are seen on a first come, first serve basis.    [facility] Providers:  Organization          Address  Phone   Notes  [facility] [address], [place] [phone] Also accepts self-pay patients.  [facility] [address], [place]  [phone]   [place], [address], [place] [phone]   [facility] [address] [place] [phone]   Lucianne Lei [address], [place]   [phone] Only accepts [place] Access [place] patients after they have their name applied to their card.   Self-Pay (no insurance) in [facility]:  Organization         Address  Phone   Notes  Sickle Cell Patients, [facility] [place] [phone]   [facility] [place] [phone]   [facility] [place]  [place], [place], [place] [phone]   [facility]/Dr. Osei-Bonsu  [address] [place] or [address], [place] [phone] Phone number for both [place] and [place] locations is the same.  Urgent Medical and [facility] [address], [place] [phone]   [facility] [address] [place] or [address] [phone]   [facility] [address] [place] [phone], phone; [phone], fax Sees patients 1st and 3rd Saturday of every month.  Must not qualify for public or private insurance (i.e. Medicaid, Medicare, [place] Health Choice, Veterans' Benefits)  Household income should be no more than 200% of the poverty level The clinic cannot treat you if you are pregnant or think you are pregnant  Sexually transmitted diseases are not treated at the clinic.    Dental Care: Organization         Address  Phone  Notes  [facility] [place] [phone] Accepts children up to age 78 who are enrolled in [place] or [place]; pregnant women with a Medicaid card; and children who have applied for Medicaid or [place] Health Choice, but were declined, whose parents can pay a reduced fee at time of service.  [facility] Department of [facility]  [address], [place] [phone] Accepts children up to age 76 who are enrolled in [place] or [place]; pregnant women with a Medicaid card; and children who have applied for Medicaid or  Health Choice, but were declined, whose parents can pay a reduced fee at time of service.  [place] Adult [facility] Access PROGRAM  [place] [phone] Patients are seen by appointment only. Walk-ins are not accepted. [place] will see patients 3 years of age and older.  Monday - Tuesday (8am-5pm) Most Wednesdays (8:30-5pm) $30 per visit, cash only  [facility] Adult Dental Access PROGRAM  [address], [facility] [phone] Patients are seen by appointment only. Walk-ins are not accepted. [place] will see patients 66 years of age and older. One Wednesday Evening (Monthly: Volunteer Based).  $30 per visit, cash only  [place]  [phone] for adults; Children under age 42, call Graduate Pediatric Dentistry at [phone]. Children aged 27-14, please call [phone] to request a pediatric application.  Dental services are provided in all areas of dental care including fillings, crowns and bridges, complete and partial dentures, implants, gum treatment, root canals, and extractions. Preventive care is also provided. Treatment is provided to both adults and children. Patients are selected via a lottery and there is often a waiting list.   [facility] [address], [place]  [phone] [email or web address]   [facility] [address] [place], [place]  [phone], Ext. [phone] Second and Fourth Thursday of each month, opens at 6:30 AM; Clinic ends at 9 AM.  Patients are seen on a first-come first-served basis, and a limited number are seen during each clinic.   [facility]  [address] Hillard Danker [place], [place] [phone]   Eligibility Requirements You must have lived in [place], [place], or [place] counties for at least the last three months.   You cannot be eligible for state or federal sponsored [employer], including [employer], [place], or [employer].   You generally cannot be eligible for healthcare insurance through your employer.    How to apply: Eligibility screenings are held every Tuesday and Wednesday afternoon from 1:00 pm until 4:00 pm. You do not need an appointment for the interview!  [facility] [address], [place], [place]   [place]  [place] Department  [place]  [phone]    Behavioral Health Resources in the Community: Intensive Outpatient Programs Organization         Address  Phone  Notes  [place]. [address], [place], [place] [phone]   [facility] Outpatient [address], [place], [place]   ADS: Alcohol & Drug Svcs [address], [place], [place]   [place] [address]  [place], [place] or [phone]   Substance Abuse Resources Organization         Address  Phone  Notes  Alcohol and Drug Services  [phone]   [place]  [phone]   [facility] [place]   Chinita Pester  [phone]   Residential & Outpatient Substance Abuse Program  [phone]   Psychological Services Organization         Address  Phone  Notes  [facility]  [place]  [phone]    [place] [address] [place] or [phone]    Mobile Crisis Teams Organization         Address  Phone  Notes  [facility], Mobile Crisis Care Unit  [phone]   [facility]  [address] [place], [place]   [place] Levels [address] [place] [phone]    Self-Help/Support Groups Organization         Address  Phone  Notes  [facility]ental Health Assoc[facility] of [place] - variety of support groups  [place] Call for more information  Narcotics Anonymous (NA), Caring [facility] [address], [employer]  2 meetings at this location   Special educational needs teacher         Address  Phone  Notes  [facility] Residential Treatment [place],    [place]  [phone]   [facility]  [address] [place] [address], [place], [place]   [place], [place] [phone] Admissions: 8am-3pm M-F  Incentives Substance [place] [address],    [place], [place] [phone]   [facility] [address] [place], [place], [place]   [facility] [address],  [place], [place]   [facility] - Intensive Outpatient [address] Kristeen [address], [place], [place]   [facility] ([place].,  [place], [place] [phone] or [phone]   [facility] Services (RTS) [address], [place], [place] Accepts Medicaid  [facility] [place] [address]  [place] [phone] Substance Abuse/Addiction Treatment   [facility] Organization         Address  Phone  Notes  [facility]  [phone]   Domenic Schwab, PhD [address] Arlis Porta [place], [place]   [phone] or [phone]   [place]  [place], [place] [phone]   [facility] [address], [place], [place] [phone] Insurance/Medicaid/sponsorship through [facility] Heritage and Families [address] [place]                                    [place], [place] [phone] [place] [address], [place] [phone]    Dr. Adele Schilder  [phone]   [facility] Dept. 1) [address], [place] 2) [place] 3)  [place] [address], [place] [phone]  [phone]   [place] [phone] or [phone] (After Hours)

## 2013-12-13 NOTE — ED Provider Notes (Signed)
CSN: [id number]     Arrival date & time 12/13/13  0809 History   First MD Initiated Contact with Patient 12/13/13 [id number]     Chief Complaint  Patient presents with  . Facial Pain  . Conjunctivitis  . Sore Throat     (Consider location/radiation/quality/duration/timing/severity/associated sxs/prior Treatment) HPI Comments: Patient is a 33 year old female presents emergency room chief complaint of persistent nasal congestion for over 3 weeks. Patient reports sore throat for 5 days. She also reports left eye redness 4 days ago in the morning, right eye redness and matting later on that day. 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 She reports fever 3 days ago. Reports taking Tylenol flu without full resolution of symptoms.   Patient is a 33 y.o. female presenting with conjunctivitis and pharyngitis. The history is provided by the patient. No language interpreter was used.  Conjunctivitis Associated symptoms include congestion, coughing, a fever and a sore throat.  Sore Throat Associated symptoms include congestion, coughing, a fever and a sore throat.    Past Medical History  Diagnosis Date  . Hypertension    Past Surgical History  Procedure Laterality Date  . Cesarean section     History reviewed. No pertinent family history. History  Substance Use Topics  . Smoking status: Never Smoker   . Smokeless tobacco: Not on file  . Alcohol Use: No   OB History    No data available     Review of Systems  Constitutional: Positive for fever.  HENT: Positive for congestion, sinus pressure and sore throat. Negative for ear pain.   Eyes: Positive for pain, discharge and redness.  Respiratory: Positive for cough.       Allergies  Review of patient's allergies indicates no known allergies.  Home Medications   Prior to Admission medications   Medication Sig Start Date End Date Taking? Authorizing Provider  amoxicillin-clavulanate (AUGMENTIN)  875-125 MG per tablet Take 1 tablet by mouth every 12 (twelve) hours. 09/05/12   Renne CriglerJoshua Geiple, PA-C  doxycycline (VIBRAMYCIN) 100 MG capsule Take 1 capsule (100 mg total) by mouth 2 (two) times daily. 08/29/13   Linna HoffJames D Kindl, MD  fluticasone (FLONASE) 50 MCG/ACT nasal spray Place 2 sprays into the nose daily. 09/05/12   Renne CriglerJoshua Geiple, PA-C  fluticasone (FLONASE) 50 MCG/ACT nasal spray Place 1 spray into both nostrils 2 (two) times daily. 08/29/13   Linna HoffJames D Kindl, MD   BP 151/110 mmHg  Pulse 103  Temp(Src) 98.4 F (36.9 C) (Oral)  Resp 20  SpO2 98%  LMP 09/12/2013 Physical Exam  Constitutional: She is oriented to person, place, and time. She appears well-developed and well-nourished.  HENT:  Head: Normocephalic and atraumatic.  Right Ear: Tympanic membrane and external ear normal. Tympanic membrane is not retracted and not bulging. No middle ear effusion.  Left Ear: Tympanic membrane and external ear normal. Tympanic membrane is not retracted and not bulging.  No middle ear effusion.  Nose: Right sinus exhibits maxillary sinus tenderness and frontal sinus tenderness. Left sinus exhibits maxillary sinus tenderness and frontal sinus tenderness.  Mouth/Throat: Uvula is midline and mucous membranes are normal. No oropharyngeal exudate or posterior oropharyngeal edema.  Tonsils surgically absent  Eyes: EOM are normal. Pupils are equal, round, and reactive to light. Right eye exhibits discharge and exudate. Left eye exhibits discharge and exudate. Right conjunctiva is injected. Left conjunctiva is injected.  Pulmonary/Chest: Effort normal. She has no decreased breath sounds. She has no wheezes. She  has no rhonchi. She has no rales.  Neurological: She is alert and oriented to person, place, and time.  Skin: Skin is warm and dry.  Nursing note and vitals reviewed.   ED Course  Procedures (including critical care time) Labs Review Labs Reviewed  RAPID STREP SCREEN  CULTURE, GROUP A STREP     Imaging Review No results found.   EKG Interpretation None      MDM   Final diagnoses:  Subacute maxillary sinusitis  Conjunctivitis of both eyes  Elevated blood pressure reading   Patient presents with persistent sinusitis for 3 weeks and fever earlier this week, also complains of URI symptoms. Bilateral conjunctivitis. Negative rapid strep. 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 Patient is afebrile in ED, lungs clear to auscultation. Patient's blood pressure is elevated at 151/110, patient does have a history of hypertension, advised following up with PCP for further evaluation and treatment. Meds given in ED:  Medications - No data to display  New Prescriptions   AMOXICILLIN-CLAVULANATE (AUGMENTIN) 875-125 MG PER TABLET    Take 1 tablet by mouth 2 (two) times daily.   DEXTROMETHORPHAN-GUAIFENESIN (TUSSIN DM) 10-100 MG/5ML LIQUID    Take 10 mLs by mouth every 4 (four) hours as needed for cough.   ERYTHROMYCIN OPHTHALMIC OINTMENT    Place a 1/2 inch ribbon of ointment into the lower eyelid 4 times a day for 5 days.   OXYMETAZOLINE (AFRIN NASAL SPRAY) 0.05 % NASAL SPRAY    Place 1 spray into both nostrils 2 (two) times daily. Use for only 3 days.   SODIUM CHLORIDE (OCEAN) 0.65 % SOLN NASAL SPRAY    Place 1 spray into both nostrils as needed for congestion.        Mellody DrownLauren Roderica Cathell, PA-C 12/13/13 [id number]  Suzi RootsKevin E Steinl, MD 12/13/13 1004

## 2013-12-15 LAB — CULTURE, GROUP A STREP

## 2014-01-31 ENCOUNTER — Encounter (HOSPITAL_COMMUNITY): Payer: Self-pay

## 2014-01-31 ENCOUNTER — Emergency Department (HOSPITAL_COMMUNITY)
Admission: EM | Admit: 2014-01-31 | Discharge: 2014-01-31 | Disposition: A | Discharge status: Home / Self Care | Payer: BC Managed Care – PPO | Attending: Emergency Medicine | Admitting: Emergency Medicine

## 2014-01-31 ENCOUNTER — Emergency Department (HOSPITAL_COMMUNITY): Payer: BC Managed Care – PPO

## 2014-01-31 DIAGNOSIS — I10 Essential (primary) hypertension: Secondary | ICD-10-CM | POA: Insufficient documentation

## 2014-01-31 DIAGNOSIS — Z792 Long term (current) use of antibiotics: Secondary | ICD-10-CM | POA: Insufficient documentation

## 2014-01-31 DIAGNOSIS — R609 Edema, unspecified: Secondary | ICD-10-CM

## 2014-01-31 DIAGNOSIS — K1120 Sialoadenitis, unspecified: Secondary | ICD-10-CM | POA: Insufficient documentation

## 2014-01-31 DIAGNOSIS — R252 Cramp and spasm: Secondary | ICD-10-CM | POA: Insufficient documentation

## 2014-01-31 DIAGNOSIS — R220 Localized swelling, mass and lump, head: Secondary | ICD-10-CM | POA: Insufficient documentation

## 2014-01-31 DIAGNOSIS — K118 Other diseases of salivary glands: Secondary | ICD-10-CM | POA: Insufficient documentation

## 2014-01-31 DIAGNOSIS — Z7951 Long term (current) use of inhaled steroids: Secondary | ICD-10-CM | POA: Insufficient documentation

## 2014-01-31 DIAGNOSIS — H6593 Unspecified nonsuppurative otitis media, bilateral: Secondary | ICD-10-CM | POA: Insufficient documentation

## 2014-01-31 LAB — I-STAT CREATININE, ED: Creatinine, Ser: 0.7 mg/dL (ref 0.50–1.10)

## 2014-01-31 MED ORDER — ONDANSETRON HCL 4 MG/2ML IJ SOLN
4.0000 mg | Freq: Once | INTRAMUSCULAR | Status: AC
  Administered 2014-01-31: 4 mg via INTRAVENOUS
  Filled 2014-01-31: qty 2

## 2014-01-31 MED ORDER — IOHEXOL 300 MG/ML  SOLN
75.0000 mL | Freq: Once | INTRAMUSCULAR | Status: AC | PRN
  Administered 2014-01-31: 75 mL via INTRAVENOUS

## 2014-01-31 MED ORDER — HYDROCODONE-ACETAMINOPHEN 5-325 MG PO TABS: 1.0000 | ORAL_TABLET | Freq: Four times a day (QID) | ORAL | Status: DC | PRN

## 2014-01-31 MED ORDER — MORPHINE SULFATE 4 MG/ML IJ SOLN
4.0000 mg | Freq: Once | INTRAMUSCULAR | Status: AC
  Administered 2014-01-31: 4 mg via INTRAVENOUS
  Filled 2014-01-31: qty 1

## 2014-01-31 MED ORDER — AMOXICILLIN-POT CLAVULANATE 875-125 MG PO TABS: 1.0000 | ORAL_TABLET | Freq: Two times a day (BID) | ORAL | Status: DC

## 2014-01-31 NOTE — ED Provider Notes (Signed)
CSN: [id number]     Arrival date & time 01/31/14  1403 History   This chart was scribed for non-physician practitioner, Fayrene HelperBowie Fabricio Endsley, PA-C working with Juliet RudeNathan R. Rubin PayorPickering, MD, by Abel PrestoKara Demonbreun, ED Scribe. This patient was seen in room TR10C/TR10C and the patient's care was started at 3:28 PM.    Chief Complaint  Patient presents with  . Dental Pain    The history is provided by the patient. No language interpreter was used.    HPI Comments: Darlene Rodriguez is a 33 y.o. female who presents to the Emergency Department complaining of 7/10 mouth pain for the past 2 days .Pt notes associated facial swelling, inability to open her mouth due to pain, and L ear pain. Does not know if she has any dental pain.  Pt note turning her head worsens the pain. Pt is utd with her vaccines. Pt is allergic to Benedryl. Pt denies fever and numbness. She has no PCP.   Past Medical History  Diagnosis Date  . Hypertension    Past Surgical History  Procedure Laterality Date  . Cesarean section     No family history on file. History  Substance Use Topics  . Smoking status: Never Smoker   . Smokeless tobacco: Not on file  . Alcohol Use: No   OB History    No data available     Review of Systems  Constitutional: Negative for fever.  HENT: Positive for dental problem and facial swelling.   Eyes:       Eye watering  Neurological: Negative for numbness.      Allergies  Diphenhydramine  Home Medications   Prior to Admission medications   Medication Sig Start Date End Date Taking? Authorizing Provider  amoxicillin-clavulanate (AUGMENTIN) 875-125 MG per tablet Take 1 tablet by mouth 2 (two) times daily. 12/13/13   Mellody DrownLauren Parker, PA-C  dextromethorphan-guaiFENesin (TUSSIN DM) 10-100 MG/5ML liquid Take 10 mLs by mouth every 4 (four) hours as needed for cough. 12/13/13   Mellody DrownLauren Parker, PA-C  doxycycline (VIBRAMYCIN) 100 MG capsule Take 1 capsule (100 mg total) by mouth 2 (two) times daily. 08/29/13    Linna HoffJames D Kindl, MD  erythromycin ophthalmic ointment Place a 1/2 inch ribbon of ointment into the lower eyelid 4 times a day for 5 days. 12/13/13   Lauren Parker, PA-C  fluticasone (FLONASE) 50 MCG/ACT nasal spray Place 2 sprays into the nose daily. 09/05/12   Renne CriglerJoshua Geiple, PA-C  fluticasone (FLONASE) 50 MCG/ACT nasal spray Place 1 spray into both nostrils 2 (two) times daily. 08/29/13   Linna HoffJames D Kindl, MD  oxymetazoline (AFRIN NASAL SPRAY) 0.05 % nasal spray Place 1 spray into both nostrils 2 (two) times daily. Use for only 3 days. 12/13/13   Mellody DrownLauren Parker, PA-C  sodium chloride (OCEAN) 0.65 % SOLN nasal spray Place 1 spray into both nostrils as needed for congestion. 12/13/13   Lauren Parker, PA-C   BP 140/80 mmHg  Pulse 116  Temp(Src) 98.2 F (36.8 C) (Oral)  Resp 24  Ht 5\' 3"  (1.6 m)  SpO2 100%  LMP 01/17/2014 Physical Exam  Constitutional: She is oriented to person, place, and time. She appears well-developed and well-nourished.  HENT:  Head: Normocephalic.  Right Ear: A middle ear effusion is present.  Left Ear: A middle ear effusion is present.  Mouth/Throat: There is trismus (mild) in the jaw.  Mouth: No significant dental pain No Obvious dental decay Normal nares  Significant tenderness and swelling along left parotid gland on  palpation.  Trismus noted.  Eyes: Conjunctivae are normal.  Neck: Normal range of motion. Neck supple.  Pulmonary/Chest: Effort normal.  Musculoskeletal: Normal range of motion.  Neurological: She is alert and oriented to person, place, and time.  Skin: Skin is warm and dry.  Psychiatric: She has a normal mood and affect. Her behavior is normal.  Nursing note and vitals reviewed.   ED Course  Procedures (including critical care time) DIAGNOSTIC STUDIES: Oxygen Saturation is 100% on room air , normal by my interpretation.    COORDINATION OF CARE: 3:31 PM Discussed treatment plan with patient at beside, the patient agrees with the plan and has no  further questions at this time.  4:34 PM Pt with swelling and tenderness to L parotid region suggestive of parotitis.  Pt has mild trismus.  Will obtain CT scan to r/o abscess.  Care discussed with oncoming provider who will reassess pt and d/c pending CT result.     Labs Review Labs Reviewed  I-STAT CREATININE, ED    Imaging Review No results found.   EKG Interpretation None      MDM   Final diagnoses:  Parotid swelling    BP 140/80 mmHg  Pulse 116  Temp(Src) 98.2 F (36.8 C) (Oral)  Resp 24  Ht 5\' 3"  (1.6 m)  SpO2 100%  LMP 01/17/2014   I personally performed the services described in this documentation, which was scribed in my presence. The recorded information has been reviewed and is accurate.    Fayrene HelperBowie Gurjot Brisco, PA-C 01/31/14 1635  Warnell Foresterrey Wofford, MD 01/31/14 [phone]

## 2014-01-31 NOTE — ED Notes (Signed)
PA at bedside.

## 2014-01-31 NOTE — Discharge Instructions (Signed)
Follow-up with otolaryngology. Return to the ER if you develop any severe worsening of swelling, high fever greater than 100.5, worsening of pain, difficulty swallowing or breathing  Parotitis Parotitis is soreness and inflammation of one or both parotid glands. The parotid glands produce saliva. They are located on each side of the face, below and in front of the earlobes. The saliva produced comes out of tiny openings (ducts) inside the cheeks. In most cases, parotitis goes away over time or with treatment. If your parotitis is caused by certain long-term (chronic) diseases, it may come back again.  CAUSES  Parotitis can be caused by:  Viral infections. Mumps is one viral infection that can cause parotitis.  Bacterial infections.  Blockage of the salivary ducts due to a salivary stone.  Narrowing of the salivary ducts.  Swelling of the salivary ducts.  Dehydration.  Autoimmune conditions, such as sarcoidosis or Sjogren syndrome.  Air from activities such as scuba diving, glass blowing, or playing an instrument (rare).  Human immunodeficiency virus (HIV) or acquired immunodeficiency syndrome (AIDS).  Tuberculosis. SIGNS AND SYMPTOMS   The ears may appear to be pushed up and out from their normal position.  Redness (erythema) of the skin over the parotid glands.  Pain and tenderness over the parotid glands.  Swelling in the parotid gland area.  Yellowish-white fluid (pus) coming from the ducts inside the cheeks.  Dry mouth.  Bad taste in the mouth. DIAGNOSIS  Your health care provider may determine that you have parotitis based on your symptoms and a physical exam. A sample of fluid may also be taken from the parotid gland and tested to find the cause of your infection. X-rays or computed tomography (CT) scans may be taken if your health care provider thinks you might have a salivary stone blocking your salivary duct. TREATMENT  Treatment varies depending upon the cause of  your parotitis. If your parotitis is caused by mumps, no treatment is needed. The condition will go away on its own after 7 to 10 days. In other cases, treatment may include:  Antibiotic medicine if your infection was caused by bacteria.  Pain medicines.  Gland massage.  Eating sour candy to increase your saliva production.  Removal of salivary stones. Your health care provider may flush stones out with fluids or remove them with tweezers.  Surgery to remove the parotid glands. HOME CARE INSTRUCTIONS   If you were prescribed an antibiotic medicine, finish it all even if you start to feel better.  Put warm compresses on the sore area.  Take medicines only as directed by your health care provider.  Drink enough fluids to keep your urine clear or pale yellow. SEEK IMMEDIATE MEDICAL CARE IF:   You have increasing pain or swelling that is not controlled with medicine.  You have a fever. MAKE SURE YOU:  Understand these instructions.  Will watch your condition.  Will get help right away if you are not doing well or get worse. Document Released: 07/14/2001 Document Revised: 06/08/2013 Document Reviewed: 12/18/2010 [facility] Patient Information 2015 [employer] [place]. This information is not intended to replace advice given to you by your health care provider. Make sure you discuss any questions you have with your health care provider.

## 2014-01-31 NOTE — ED Notes (Signed)
Patient transported to CT 

## 2014-01-31 NOTE — ED Notes (Signed)
Pt c/o L jaw pain.  Pt states the pain is all over and she cannot distinguish if it is originating from upper or lower teeth.

## 2014-02-01 NOTE — ED Provider Notes (Signed)
Patient signed out to me by Fayrene HelperBowie Tran, PA-C with plan to follow-up on CT results.  Patient is a 33 year old female who presents the ER with mouth and facial pain for 2 days with associated swelling to left side of face. Patient evaluated and worked up in the ER for possible parotitis versus parotid abscess. Patient without internal oral swelling, PTA, dental abscess, occlusion of the airway, airway compromise or difficulty swallowing or breathing.  PE: Constitutional: well-developed, well-nourished, no apparent distress HENT: Moderate amount of swelling noted to left side of face consistent with swollen left parotid gland. Cardiovascular: normal rate and rhythm, distal pulses intact Pulmonary/Chest: effort normal; breath sounds clear and equal bilaterally; no wheezes or rales Abdominal: soft and nontender Musculoskeletal: full ROM, no edema Lymphadenopathy: no cervical adenopathy Neurological: alert with goal directed thinking Skin: warm and dry, no rash, no diaphoresis Psychiatric: normal mood and affect, normal behavior   CT results remarkable for parotitis. We will discharge at this time, patient afebrile, well-appearing and in no acute distress. Pain controlled at this time. Patient sent home with Augmentin and medicine for pain control. I strongly recommended patient follow-up with otolaryngology regarding her parotiditis. I discussed strict return precautions with patient, and encourage her to call or return to the ER should she have any questions or concerns.  BP 155/85 mmHg  Pulse 108  Temp(Src) 98.8 F (37.1 C) (Oral)  Resp 16  Ht 5\' 3"  (1.6 m)  SpO2 99%  LMP 01/17/2014  Signed,  Ladona MowJoe Marrissa Dai, PA-C 2:38 AM   Monte FantasiaJoseph W Azaryah Heathcock, PA-C 02/01/14 0238  Monte FantasiaJoseph W Magnus Crescenzo, PA-C 02/01/14 0315  Juliet RudeNathan R. Rubin PayorPickering, MD 02/03/14 1321

## 2014-02-02 ENCOUNTER — Emergency Department (HOSPITAL_COMMUNITY): Payer: BC Managed Care – PPO

## 2014-02-02 ENCOUNTER — Encounter (HOSPITAL_COMMUNITY): Payer: Self-pay

## 2014-02-02 ENCOUNTER — Inpatient Hospital Stay (HOSPITAL_COMMUNITY)
Admission: EM | Admit: 2014-02-02 | Discharge: 2014-02-05 | DRG: 155 | Disposition: A | Discharge status: Home / Self Care | Payer: Self-pay | Attending: Internal Medicine | Admitting: Internal Medicine

## 2014-02-02 ENCOUNTER — Emergency Department (HOSPITAL_COMMUNITY): Payer: Self-pay

## 2014-02-02 DIAGNOSIS — L03221 Cellulitis of neck: Secondary | ICD-10-CM

## 2014-02-02 DIAGNOSIS — K1121 Acute sialoadenitis: Principal | ICD-10-CM

## 2014-02-02 DIAGNOSIS — Z888 Allergy status to other drugs, medicaments and biological substances status: Secondary | ICD-10-CM

## 2014-02-02 DIAGNOSIS — B9689 Other specified bacterial agents as the cause of diseases classified elsewhere: Secondary | ICD-10-CM

## 2014-02-02 DIAGNOSIS — I10 Essential (primary) hypertension: Secondary | ICD-10-CM

## 2014-02-02 DIAGNOSIS — R7303 Prediabetes: Secondary | ICD-10-CM

## 2014-02-02 DIAGNOSIS — R221 Localized swelling, mass and lump, neck: Secondary | ICD-10-CM

## 2014-02-02 DIAGNOSIS — K1120 Sialoadenitis, unspecified: Secondary | ICD-10-CM

## 2014-02-02 DIAGNOSIS — Z6841 Body Mass Index (BMI) 40.0 and over, adult: Secondary | ICD-10-CM

## 2014-02-02 LAB — CBC
HEMATOCRIT: 38.6 % (ref 36.0–46.0)
HEMOGLOBIN: 12.9 g/dL (ref 12.0–15.0)
MCH: 29.7 pg (ref 26.0–34.0)
MCHC: 33.4 g/dL (ref 30.0–36.0)
MCV: 88.9 fL (ref 78.0–100.0)
Platelets: 269 10*3/uL (ref 150–400)
RBC: 4.34 MIL/uL (ref 3.87–5.11)
RDW: 12.7 % (ref 11.5–15.5)
WBC: 12.3 10*3/uL — AB (ref 4.0–10.5)

## 2014-02-02 LAB — BASIC METABOLIC PANEL
Anion gap: 9 (ref 5–15)
BUN: 8 mg/dL (ref 6–23)
CHLORIDE: 99 meq/L (ref 96–112)
CO2: 25 mmol/L (ref 19–32)
Calcium: 8.8 mg/dL (ref 8.4–10.5)
Creatinine, Ser: 0.71 mg/dL (ref 0.50–1.10)
GFR calc Af Amer: >90 mL/min (ref >90)
GFR calc non Af Amer: >90 mL/min (ref >90)
GLUCOSE: 139 mg/dL — AB (ref 70–99)
Potassium: 3.5 mmol/L (ref 3.5–5.1)
SODIUM: 133 mmol/L — AB (ref 135–145)

## 2014-02-02 LAB — I-STAT TROPONIN, ED: Troponin i, poc: 0 ng/mL (ref 0.00–0.08)

## 2014-02-02 LAB — I-STAT CG4 LACTIC ACID, ED: LACTIC ACID, VENOUS: 1.61 mmol/L (ref 0.5–2.2)

## 2014-02-02 LAB — TSH: TSH: 1.345 u[IU]/mL (ref 0.350–4.500)

## 2014-02-02 LAB — PREGNANCY, URINE: Preg Test, Ur: NEGATIVE

## 2014-02-02 LAB — MRSA PCR SCREENING: MRSA by PCR: NEGATIVE

## 2014-02-02 MED ORDER — PIPERACILLIN-TAZOBACTAM 3.375 G IVPB
3.3750 g | Freq: Three times a day (TID) | INTRAVENOUS | Status: DC
  Administered 2014-02-02 – 2014-02-04 (×6): 3.375 g via INTRAVENOUS
  Filled 2014-02-02 (×8): qty 50

## 2014-02-02 MED ORDER — HEPARIN SODIUM (PORCINE) 5000 UNIT/ML IJ SOLN
5000.0000 [IU] | Freq: Three times a day (TID) | INTRAMUSCULAR | Status: DC
  Administered 2014-02-02 – 2014-02-05 (×9): 5000 [IU] via SUBCUTANEOUS
  Filled 2014-02-02 (×11): qty 1

## 2014-02-02 MED ORDER — DEXAMETHASONE SODIUM PHOSPHATE 10 MG/ML IJ SOLN
10.0000 mg | Freq: Three times a day (TID) | INTRAMUSCULAR | Status: AC
  Administered 2014-02-02 – 2014-02-03 (×3): 10 mg via INTRAVENOUS
  Filled 2014-02-02 (×3): qty 1

## 2014-02-02 MED ORDER — IOHEXOL 300 MG/ML  SOLN
100.0000 mL | Freq: Once | INTRAMUSCULAR | Status: AC | PRN
  Administered 2014-02-02: 90 mL via INTRAVENOUS

## 2014-02-02 MED ORDER — OXYCODONE-ACETAMINOPHEN 5-325 MG PO TABS: 1.0000 | ORAL_TABLET | Freq: Once | ORAL | Status: DC

## 2014-02-02 MED ORDER — ONDANSETRON HCL 4 MG/2ML IJ SOLN
4.0000 mg | Freq: Three times a day (TID) | INTRAMUSCULAR | Status: AC | PRN
  Administered 2014-02-02: 4 mg via INTRAVENOUS
  Filled 2014-02-02: qty 2

## 2014-02-02 MED ORDER — DEXAMETHASONE SODIUM PHOSPHATE 10 MG/ML IJ SOLN
10.0000 mg | Freq: Once | INTRAMUSCULAR | Status: AC
  Administered 2014-02-02: 10 mg via INTRAVENOUS
  Filled 2014-02-02: qty 1

## 2014-02-02 MED ORDER — SODIUM CHLORIDE 0.9 % IV BOLUS (SEPSIS)
1000.0000 mL | Freq: Once | INTRAVENOUS | Status: AC
  Administered 2014-02-02: 1000 mL via INTRAVENOUS

## 2014-02-02 MED ORDER — HYDROMORPHONE HCL 1 MG/ML IJ SOLN
1.0000 mg | INTRAMUSCULAR | Status: AC | PRN
  Administered 2014-02-02 (×2): 1 mg via INTRAVENOUS
  Filled 2014-02-02 (×2): qty 1

## 2014-02-02 MED ORDER — HYDROMORPHONE HCL 1 MG/ML IJ SOLN
1.0000 mg | Freq: Once | INTRAMUSCULAR | Status: AC
  Administered 2014-02-02: 1 mg via INTRAVENOUS
  Filled 2014-02-02: qty 1

## 2014-02-02 MED ORDER — SODIUM CHLORIDE 0.9 % IV SOLN
1250.0000 mg | Freq: Three times a day (TID) | INTRAVENOUS | Status: DC
  Administered 2014-02-02 – 2014-02-04 (×6): 1250 mg via INTRAVENOUS
  Filled 2014-02-02 (×12): qty 1250

## 2014-02-02 MED ORDER — SODIUM CHLORIDE 0.9 % IV SOLN
INTRAVENOUS | Status: DC
  Administered 2014-02-02 – 2014-02-03 (×2): 75 mL/h via INTRAVENOUS
  Administered 2014-02-03 – 2014-02-04 (×2): via INTRAVENOUS

## 2014-02-02 MED ORDER — PIPERACILLIN-TAZOBACTAM 3.375 G IVPB
3.3750 g | Freq: Once | INTRAVENOUS | Status: AC
  Administered 2014-02-02: 3.375 g via INTRAVENOUS
  Filled 2014-02-02: qty 50

## 2014-02-02 MED ORDER — SODIUM CHLORIDE 0.9 % IV SOLN: INTRAVENOUS | Status: DC

## 2014-02-02 MED ORDER — POTASSIUM CHLORIDE CRYS ER 20 MEQ PO TBCR: 40.0000 meq | EXTENDED_RELEASE_TABLET | Freq: Once | ORAL | Status: DC

## 2014-02-02 MED ORDER — HYDROMORPHONE HCL 1 MG/ML IJ SOLN
INTRAMUSCULAR | Status: AC
  Filled 2014-02-02: qty 1

## 2014-02-02 MED ORDER — SODIUM CHLORIDE 0.9 % IV SOLN
INTRAVENOUS | Status: DC
  Administered 2014-02-02: 12:00:00 via INTRAVENOUS

## 2014-02-02 MED ORDER — VANCOMYCIN HCL 10 G IV SOLR
2500.0000 mg | Freq: Once | INTRAVENOUS | Status: AC
  Administered 2014-02-02: 2500 mg via INTRAVENOUS
  Filled 2014-02-02: qty 2500

## 2014-02-02 NOTE — Progress Notes (Signed)
Pt states that she is having severe throat pain and needs more pain medication. However, pt is falling asleep during her conversation and cannot complete a thought without difficulty. No other neuro deficits noted and pt answers questions appropriately. Easily aroused. RN called pt's admitting team and informed them of pt's requests for more pain medication. No new orders received at this time.

## 2014-02-02 NOTE — ED Notes (Signed)
Pt reports sob onset last pm. sts feels like she cant breath and like her throat is closing.

## 2014-02-02 NOTE — ED Notes (Signed)
Also reporting jaw pain

## 2014-02-02 NOTE — ED Notes (Signed)
Contacted pharmacy for zosyn, that can't be pulled from pyxis.  Pharmacy to send.

## 2014-02-02 NOTE — Consult Note (Signed)
ENT CONSULT:  Reason for Consult:Acute left parotitis Referring Physician: EDP/Medical svc  Darlene Rodriguez is an 33 y.o. female.  HPI: Pt presents for eval in ED for progressive left parotitis and sore throat. The patient has no significant past medical history for previous salivary gland infections. She presents with a five-day history of progressive left facial and periparotid swelling and pain. She was seen in the emergency department on 12/27 and CT scan showed findings consistent with acute parotitis, patient treated as an outpatient with clindamycin. She returns to the emergency department with increased symptoms of left periparotid facial swelling and pain, difficulty swallowing and fever.  Past Medical History  Diagnosis Date  . Hypertension     Past Surgical History  Procedure Laterality Date  . Cesarean section      History reviewed. No pertinent family history.  Social History:  reports that she has never smoked. She does not have any smokeless tobacco history on file. She reports that she does not drink alcohol or use illicit drugs.  Allergies:  Allergies  Allergen Reactions  . Diphenhydramine Shortness Of Breath    Medications: I have reviewed the patient's current medications.  Results for orders placed or performed during the hospital encounter of 02/02/14 (from the past 48 hour(s))  CBC     (if pt has PMH of COPD)     Status: Abnormal   Collection Time: 02/02/14  6:01 AM  Result Value Ref Range   WBC 12.3 (H) 4.0 - 10.5 K/uL   RBC 4.34 3.87 - 5.11 MIL/uL   Hemoglobin 12.9 12.0 - 15.0 g/dL   HCT 38.6 36.0 - 46.0 %   MCV 88.9 78.0 - 100.0 fL   MCH 29.7 26.0 - 34.0 pg   MCHC 33.4 30.0 - 36.0 g/dL   RDW 12.7 11.5 - 15.5 %   Platelets 269 150 - 400 K/uL  Basic metabolic panel    (if pt has PMH of COPD)     Status: Abnormal   Collection Time: 02/02/14  6:01 AM  Result Value Ref Range   Sodium 133 (L) 135 - 145 mmol/L    Comment: Please note change in  reference range.   Potassium 3.5 3.5 - 5.1 mmol/L    Comment: Please note change in reference range.   Chloride 99 96 - 112 mEq/L   CO2 25 19 - 32 mmol/L   Glucose, Bld 139 (H) 70 - 99 mg/dL   BUN 8 6 - 23 mg/dL   Creatinine, Ser 0.71 0.50 - 1.10 mg/dL   Calcium 8.8 8.4 - 10.5 mg/dL   GFR calc non Af Amer >90 >90 mL/min   GFR calc Af Amer >90 >90 mL/min    Comment: (NOTE) The eGFR has been calculated using the CKD EPI equation. This calculation has not been validated in all clinical situations. eGFR's persistently <90 mL/min signify possible Chronic Kidney Disease.    Anion gap 9 5 - 15  I-stat troponin, ED (if patient has history of COPD)     Status: None   Collection Time: 02/02/14  6:18 AM  Result Value Ref Range   Troponin i, poc 0.00 0.00 - 0.08 ng/mL   Comment 3            Comment: Due to the release kinetics of cTnI, a negative result within the first hours of the onset of symptoms does not rule out myocardial infarction with certainty. If myocardial infarction is still suspected, repeat the test at appropriate intervals.  I-Stat CG4 Lactic Acid, ED     Status: None   Collection Time: 02/02/14  6:58 AM  Result Value Ref Range   Lactic Acid, Venous 1.61 0.5 - 2.2 mmol/L    Dg Chest 2 View (if Patient Has Fever And/or Copd)  02/02/2014   CLINICAL DATA:  Shortness of breath.  Nasal congestion.  EXAM: CHEST  2 VIEW  COMPARISON:  11/04/2006.  FINDINGS: Heart size is normal. Mediastinal shadows are normal. The patient has taken a poor inspiration. Allowing for that, the lungs are clear. No effusions. No significant bony finding.  IMPRESSION: Poor inspiration.  No active disease suspected.   Electronically Signed   By: Nelson Chimes M.D.   On: 02/02/2014 07:04   Ct Soft Tissue Neck W Contrast  02/02/2014   CLINICAL DATA:  Parotiditis diagnosed 2 days ago on the left. Worsening pain and swelling.  EXAM: CT NECK WITH CONTRAST  TECHNIQUE: Multidetector CT imaging of the neck  was performed using the standard protocol following the bolus administration of intravenous contrast.  CONTRAST:  81mL OMNIPAQUE IOHEXOL 300 MG/ML  SOLN  COMPARISON:  01/31/2014  FINDINGS: Pharynx and larynx: No mucosal or submucosal lesion.  Salivary glands: Again demonstrated is diffuse swelling and hyperemia of the left parotid gland consistent with acute parotiditis. There is edema of the surrounding soft tissues, tracking inferiorly. Regional edema is overall worsened, now with edema seen in the retropharyngeal/prevertebral space. There is no evidence of drainable abscess. Mild regional reactive nodal prominence is demonstrated, as expected. The right parotid gland is within normal limits. Both submandibular glands are normal. There is no evidence of sellar area duct stone.  Thyroid: Symmetric and normal.  Lymph nodes: Mild reactive regional nodal prominence without evidence of worrisome focal enlargement or low density.  Vascular: Arterial and venous structures patent.  Limited intracranial: No abnormality seen.  Mastoids and visualized paranasal sinuses: Clear  Skeleton: No significant finding  Upper chest: Normal  IMPRESSION: Left parotiditis with regional inflammatory edema. The regional edema has worsened, including now development of edema in the retropharyngeal/prevertebral space. There does not appear to be a dominant or apparently drainable collection in any location however. This does suggest that the process may not be responding to the current therapy.   Electronically Signed   By: Nelson Chimes M.D.   On: 02/02/2014 08:23   Ct Maxillofacial W/cm  01/31/2014   CLINICAL DATA:  Left facial swelling for 3 days  EXAM: CT MAXILLOFACIAL WITH CONTRAST  TECHNIQUE: Multidetector CT imaging of the maxillofacial structures was performed with intravenous contrast. Multiplanar CT image reconstructions were also generated. A small metallic BB was placed on the right temple in order to reliably differentiate  right from left.  CONTRAST:  56mL OMNIPAQUE IOHEXOL 300 MG/ML  SOLN  COMPARISON:  None.  FINDINGS: Skull base and its contents are within normal limits. The orbits and their contents are unremarkable.  The left parotid gland is enlarged and hyperemic with surrounding info subcutaneous inflammatory changes. These extend inferiorly along the lateral left neck with some mild subcutaneous fluid identified. No focal abscess is seen. These changes are consistent with parotitis with extension. This extends to the level of the hyoid bone on the left. The vascular structures is visualized are within normal limits. No acute bony abnormality is seen. No significant lymphadenopathy is noted.  IMPRESSION: Changes consistent with parotitis with localized inflammatory extension inferiorly. No abscess is seen.   Electronically Signed   By: Linus Mako.D.  On: 01/31/2014 17:11    Review of Systems  Constitutional: Positive for fever and chills.  HENT: Positive for ear discharge and sore throat.   Respiratory: Negative.   Cardiovascular: Negative.   Gastrointestinal: Negative.   Neurological: Positive for headaches.   Blood pressure 148/86, pulse 92, temperature 98.4 F (36.9 C), temperature source Oral, resp. rate 22, height $RemoveBe'5\' 4"'TCIkUocLz$  (1.626 m), weight 136.079 kg (300 lb), last menstrual period 01/17/2014, SpO2 96 %. Physical Exam  Constitutional: She is oriented to person, place, and time. She appears well-developed and well-nourished.  HENT:  Dry oral mucosa Reduced Left salivary flow, no stone Erythema of OC, mild edema No airway obstruction  Neck:  Sig Left parotid swelling and erythema. Nl FN func  Cardiovascular: Normal rate.   Respiratory: Effort normal.  Musculoskeletal: Normal range of motion.  Neurological: She is alert and oriented to person, place, and time.    Assessment/Plan: The patient presents with increased symptoms of acute parotitis, agree with ER physician regarding admission to the  hospital for intravenous hydration, antibiotics and pain management. The patient was started on Zosyn and vancomycin and IV Decadron to reduce swelling. Follow-up CT scan from 12/29 shows continued significant left periparotid swelling, soft tissue changes consistent with cellulitis and mild adenopathy. No evidence of stone or abscess. The patient has some lateral pharyngeal/retropharyngeal edema. No evidence of abscess or need for surgical involvement at this time. Recommend salivary gland precautions with increase oral fluids, IV hydration, sialagogues and warm compress as tolerated. Will closely follow with medical service, expect gradual improvement over the next 48-72 hours with anticipated discharge on broad-spectrum oral antibiotics.  [place], Labrina Lines 02/02/2014, 11:17 AM

## 2014-02-02 NOTE — Progress Notes (Addendum)
ANTIBIOTIC CONSULT NOTE - INITIAL  Pharmacy Consult for vancomycin; add zosyn Indication: parotitis  Allergies  Allergen Reactions  . Diphenhydramine Shortness Of Breath    Patient Measurements: Height: 5\' 4"  (162.6 cm) Weight: 300 lb (136.079 kg) IBW/kg (Calculated) : 54.7   Vital Signs: Temp: 98.4 F (36.9 C) (12/29 0546) Temp Source: Oral (12/29 0546) BP: 164/97 mmHg (12/29 0610) Pulse Rate: 120 (12/29 0610) Intake/Output from previous day:   Intake/Output from this shift:    Labs:  Recent Labs  01/31/14 1558 02/02/14 0601  WBC  --  12.3*  HGB  --  12.9  PLT  --  269  CREATININE 0.70 0.71   Estimated Creatinine Clearance: 136.6 mL/min (by C-G formula based on Cr of 0.71). No results for input(s): VANCOTROUGH, VANCOPEAK, VANCORANDOM, GENTTROUGH, GENTPEAK, GENTRANDOM, TOBRATROUGH, TOBRAPEAK, TOBRARND, AMIKACINPEAK, AMIKACINTROU, AMIKACIN in the last 72 hours.   Microbiology: No results found for this or any previous visit (from the past 720 hour(s)).  Medical History: Past Medical History  Diagnosis Date  . Hypertension     Medications:  See medication history Assessment: 33 yo lady who was treated in ED 12/28 for parotitis with augmentin.  She came back to ED today with worsening cellulitis and will start vancomycin  Goal of Therapy:  Vancomycin trough level 10-15 mcg/ml  Plan:  Vancomycin 2500 mg IV X 1 then 1250 mg IV q8 hours F/u renal function, cultures and clinical course.  Thanks for allowing pharmacy to be a part of this patient's care.  Talbert CageLora Seay, PharmD Clinical Pharmacist, [phone]  02/02/2014,6:57 AM  Addendum: Adding zosyn for gram negative coverage. Received 3.375g in the ED at 1038. Renal function wnl.  Plan: 1) Zosyn 3.375g IV q8 (4 hour infusion)  Louie CasaJennifer Totiana Everson, PharmD, BCPS 02/02/2014, 12:34 PM

## 2014-02-02 NOTE — Progress Notes (Signed)
Utilization Review Completed.Tarena Gockley T12/29/2015  

## 2014-02-02 NOTE — ED Notes (Signed)
Patient's husband given cup of coffee per request.

## 2014-02-02 NOTE — ED Provider Notes (Signed)
CSN: [id number]     Arrival date & time 02/02/14  0531 History   None    Chief Complaint  Patient presents with  . Shortness of Breath  . Nasal Congestion     (Consider location/radiation/quality/duration/timing/severity/associated sxs/prior Treatment) HPI Comments: Patient presents with worsening left facial swelling and neck swelling with cellulitis. Patient was seen in emergency department 2 days ago and diagnosed with left-sided parotitis with extension on CT scan. She was started on Augmentin at that time. Patient followed up with ENT yesterday and her medication was changed to clindamycin. She has taken 2 doses of this. Upon awaking this morning, patient noted worsening of her facial and neck swelling as well as new pressure in her throat. She is comfortable breathing normally but the sensation is worse when she breathes quickly. No difficulty swallowing. No fever. The skin around the anterior aspect of her neck is more red today. No cough or chest pain. No history of diabetes or other immunocompromising states.  Patient is a 33 y.o. female presenting with shortness of breath. The history is provided by the patient and medical records.  Shortness of Breath Associated symptoms: no abdominal pain, no chest pain, no cough, no fever, no headaches, no rash, no sore throat and no vomiting     Past Medical History  Diagnosis Date  . Hypertension    Past Surgical History  Procedure Laterality Date  . Cesarean section     History reviewed. No pertinent family history. History  Substance Use Topics  . Smoking status: Never Smoker   . Smokeless tobacco: Not on file  . Alcohol Use: No   OB History    No data available     Review of Systems  Constitutional: Negative for fever.  HENT: Positive for facial swelling. Negative for rhinorrhea and sore throat.   Eyes: Negative for redness.  Respiratory: Positive for shortness of breath. Negative for cough, chest tightness and stridor.    Cardiovascular: Negative for chest pain.  Gastrointestinal: Negative for nausea, vomiting, abdominal pain and diarrhea.  Genitourinary: Negative for dysuria.  Musculoskeletal: Negative for myalgias.  Skin: Positive for color change. Negative for rash.  Neurological: Negative for headaches.    Allergies  Diphenhydramine  Home Medications   Prior to Admission medications   Medication Sig Start Date End Date Taking? Authorizing Provider  clindamycin (CLEOCIN) 300 MG capsule Take 300 mg by mouth 3 (three) times daily.   Yes Historical Provider, MD  HYDROcodone-acetaminophen (NORCO/VICODIN) 5-325 MG per tablet Take 1-2 tablets by mouth every 6 (six) hours as needed for moderate pain or severe pain. 01/31/14  Yes Monte Fantasia, PA-C  sodium chloride (OCEAN) 0.65 % SOLN nasal spray Place 1 spray into both nostrils as needed for congestion. 12/13/13  Yes Mellody Drown, PA-C  amoxicillin-clavulanate (AUGMENTIN) 875-125 MG per tablet Take 1 tablet by mouth 2 (two) times daily. Patient not taking: Reported on 02/02/2014 01/31/14   Monte Fantasia, PA-C  dextromethorphan-guaiFENesin (TUSSIN DM) 10-100 MG/5ML liquid Take 10 mLs by mouth every 4 (four) hours as needed for cough. Patient not taking: Reported on 02/02/2014 12/13/13   Mellody Drown, PA-C  doxycycline (VIBRAMYCIN) 100 MG capsule Take 1 capsule (100 mg total) by mouth 2 (two) times daily. Patient not taking: Reported on 02/02/2014 08/29/13   Linna Hoff, MD  erythromycin ophthalmic ointment Place a 1/2 inch ribbon of ointment into the lower eyelid 4 times a day for 5 days. Patient not taking: Reported on 02/02/2014 12/13/13  Mellody DrownLauren Parker, PA-C  fluticasone (FLONASE) 50 MCG/ACT nasal spray Place 2 sprays into the nose daily. Patient not taking: Reported on 02/02/2014 09/05/12   Renne CriglerJoshua Thanh Mottern, PA-C  fluticasone [facility](FLONASE) 50 MCG/ACT nasal spray Place 1 spray into both nostrils 2 (two) times daily. Patient not taking: Reported on 02/02/2014  08/29/13   Linna HoffJames D Kindl, MD  oxymetazoline [facility](AFRIN NASAL SPRAY) 0.05 % nasal spray Place 1 spray into both nostrils 2 (two) times daily. Use for only 3 days. Patient not taking: Reported on 02/02/2014 12/13/13   Mellody DrownLauren Parker, PA-C   BP 164/97 mmHg  Pulse 120  Temp(Src) 98.4 F (36.9 C) (Oral)  Resp 24  Ht 5\' 4"  (1.626 m)  Wt 300 lb (136.079 kg)  BMI 51.47 kg/m2  SpO2 98%  LMP 01/17/2014   Physical Exam  Constitutional: She appears well-developed and well-nourished.  HENT:  Head: Normocephalic and atraumatic.  Right Ear: Hearing, tympanic membrane and ear canal normal.  Left Ear: Hearing, tympanic membrane and ear canal normal.  Nose: Nose normal. No mucosal edema or rhinorrhea.  Mouth/Throat: Oropharynx is clear and moist.  Extensive L facial swelling and anterior neck swelling with mild erythema and warmth consistent with cellulitis.   Eyes: Conjunctivae are normal. Right eye exhibits no discharge. Left eye exhibits no discharge.  Neck: Normal range of motion. Neck supple.  Cardiovascular: Normal rate, regular rhythm and normal heart sounds.   No murmur heard. Pulmonary/Chest: Effort normal and breath sounds normal. No stridor. No respiratory distress. She has no wheezes. She has no rales.  Abdominal: Soft. There is no tenderness.  Neurological: She is alert.  Skin: Skin is warm and dry.  Psychiatric: She has a normal mood and affect.  Nursing note and vitals reviewed.   ED Course  Procedures (including critical care time) Labs Review Labs Reviewed  CBC - Abnormal; Notable for the following:    WBC 12.3 (*)    All other components within normal limits  BASIC METABOLIC PANEL - Abnormal; Notable for the following:    Sodium 133 (*)    Glucose, Bld 139 (*)    All other components within normal limits  I-STAT TROPOININ, ED  I-STAT CG4 LACTIC ACID, ED    Imaging Review Dg Chest 2 View (if Patient Has Fever And/or Copd)  02/02/2014   CLINICAL DATA:  Shortness of  breath.  Nasal congestion.  EXAM: CHEST  2 VIEW  COMPARISON:  11/04/2006.  FINDINGS: Heart size is normal. Mediastinal shadows are normal. The patient has taken a poor inspiration. Allowing for that, the lungs are clear. No effusions. No significant bony finding.  IMPRESSION: Poor inspiration.  No active disease suspected.   Electronically Signed   By: Paulina FusiMark  Shogry M.D.   On: 02/02/2014 07:04   Ct Soft Tissue Neck W Contrast  02/02/2014   CLINICAL DATA:  Parotiditis diagnosed 2 days ago on the left. Worsening pain and swelling.  EXAM: CT NECK WITH CONTRAST  TECHNIQUE: Multidetector CT imaging of the neck was performed using the standard protocol following the bolus administration of intravenous contrast.  CONTRAST:  90mL OMNIPAQUE IOHEXOL 300 MG/ML  SOLN  COMPARISON:  01/31/2014  FINDINGS: Pharynx and larynx: No mucosal or submucosal lesion.  Salivary glands: Again demonstrated is diffuse swelling and hyperemia of the left parotid gland consistent with acute parotiditis. There is edema of the surrounding soft tissues, tracking inferiorly. Regional edema is overall worsened, now with edema seen in the retropharyngeal/prevertebral space. There is no evidence of drainable abscess.  Mild regional reactive nodal prominence is demonstrated, as expected. The right parotid gland is within normal limits. Both submandibular glands are normal. There is no evidence of sellar area duct stone.  Thyroid: Symmetric and normal.  Lymph nodes: Mild reactive regional nodal prominence without evidence of worrisome focal enlargement or low density.  Vascular: Arterial and venous structures patent.  Limited intracranial: No abnormality seen.  Mastoids and visualized paranasal sinuses: Clear  Skeleton: No significant finding  Upper chest: Normal  IMPRESSION: Left parotiditis with regional inflammatory edema. The regional edema has worsened, including now development of edema in the retropharyngeal/prevertebral space. There does not  appear to be a dominant or apparently drainable collection in any location however. This does suggest that the process may not be responding to the current therapy.   Electronically Signed   By: Paulina Fusi M.D.   On: 02/02/2014 08:23   Ct Maxillofacial W/cm  01/31/2014   CLINICAL DATA:  Left facial swelling for 3 days  EXAM: CT MAXILLOFACIAL WITH CONTRAST  TECHNIQUE: Multidetector CT imaging of the maxillofacial structures was performed with intravenous contrast. Multiplanar CT image reconstructions were also generated. A small metallic BB was placed on the right temple in order to reliably differentiate right from left.  CONTRAST:  75mL OMNIPAQUE IOHEXOL 300 MG/ML  SOLN  COMPARISON:  None.  FINDINGS: Skull base and its contents are within normal limits. The orbits and their contents are unremarkable.  The left parotid gland is enlarged and hyperemic with surrounding info subcutaneous inflammatory changes. These extend inferiorly along the lateral left neck with some mild subcutaneous fluid identified. No focal abscess is seen. These changes are consistent with parotitis with extension. This extends to the level of the hyoid bone on the left. The vascular structures is visualized are within normal limits. No acute bony abnormality is seen. No significant lymphadenopathy is noted.  IMPRESSION: Changes consistent with parotitis with localized inflammatory extension inferiorly. No abscess is seen.   Electronically Signed   By: Alcide Clever M.D.   On: 01/31/2014 17:11     EKG Interpretation   Date/Time:  Tuesday February 02 2014 05:44:03 EST Ventricular Rate:  102 PR Interval:  130 QRS Duration: 86 QT Interval:  334 QTC Calculation: 435 R Axis:   110 Text Interpretation:  Sinus tachycardia T wave abnormality inferiorly,  seen on previous EKG Confirmed by [facility]  MD, APRIL ([id number]) on  02/02/2014 6:12:27 AM       6:50 AM Patient seen and examined. No airway compromise at this time, but with  worsening cellulitis. Feel that patient needs reimaged to evaluate extent of infection, escalation of antibiotics, probable admission. Will have attending see patient.    Vital signs reviewed and are as follows: BP 164/97 mmHg  Pulse 120  Temp(Src) 98.4 F (36.9 C) (Oral)  Resp 24  Ht 5\' 4"  (1.626 m)  Wt 300 lb (136.079 kg)  BMI 51.47 kg/m2  SpO2 98%  LMP 01/17/2014  7:00 AM Discussed with Dr. Romeo Apple who will see.   8:37 AM CT shows progression of infection. Patient stable at current time. Will call ENT for reccs, admit. Pt informed.   9:06 AM ENT was paged. I spoke with nurse and scheduler. Awaiting call from Dr. Annalee Genta.   9:26 AM Pt stable.   9:51 AM Spoke with Dr. Annalee Genta. He agrees with Vanc/Zosyn at this point. We discussed IV steroids and will give trial.   Will call for admit.   10:16 AM Spoke with IMTS who  will see and admit. Stepdown bed requested.   MDM   Final diagnoses:  Neck swelling  Parotiditis   Admit.    Renne CriglerJoshua Ruthanne Mcneish, PA-C 02/02/14 1017  Purvis SheffieldForrest Harrison, MD 02/02/14 1039  Purvis SheffieldForrest Harrison, MD 02/02/14 [phone]

## 2014-02-02 NOTE — H&P (Signed)
Date: 02/02/2014               Patient Name:  Darlene Rodriguez MRN: [id number]  DOB: 08/23/1980 Age / Sex: 33 y.o., female   PCP: No Pcp Per Patient         Medical Service: Internal Medicine Teaching Service         Attending Physician: Dr. Meredith PelJoines    First Contact: Dr. Senaida Oresichardson Pager: [phone]  Second Contact: Dr. Delane GingerGill Pager: [phone]       After Hours (After 5p/  First Contact Pager: [phone]  weekends / holidays): Second Contact Pager: [phone]   Chief Complaint: L sided facial swelling and feeling like throat closing up  History of Present Illness: Darlene Rodriguez is a 33 year old female with PMH of HTN who presents to the emergency room again today for worsening of her left sided facial swelling and now feeling like her throat is closing.  She says she first noticed left sided facial swelling and pain Thursday which then continued to get worse until she came to the emergency room on Sunday,12/27.  She was discharged early morning on 12/28 from the ED with diagnosis of L parotitis and sent home with Augmentin with recommended ENT follow up.  She did see Dr. Annalee GentaShoemaker from ENT 12/28 who noted worsening of swelling and changed antibiotics to Clindamycin.  She has taken 2 doses of the Clindamycin but feels like the swelling is worsening and now has sensation of her throat closing up.  She is able to breath without difficulty at this time but says when she takes a deep breath she feels tightness in her throat.  Repeat imaging today in the ED with CT soft tissue neck with contrast showed L parotiditis with regional inflammatory edema that has worsened and extended to retropharyngeal/preverteberal space.   ED spoke with ENT, Dr. Annalee GentaShoemaker who will be by to see the patient. In the meantime, she has been started on Vancomycin and Zosyn and IV Decadron.   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 She does not recall her immunization history. Last time she ate or drank was last night  slowly sipping on chicken broth that she was able to swallow but has pain with swallowing. She denies having any known recent tooth infection, but does admit to likely having cavities and has not been to a dentist in quite sometime.   Meds: Current Facility-Administered Medications  Medication Dose Route Frequency Provider Last Rate Last Dose  . 0.9 %  sodium chloride infusion   Intravenous Continuous Renne CriglerJoshua Geiple, PA-C      . dexamethasone (DECADRON) injection 10 mg  10 mg Intravenous Once HCA IncJoshua Geiple, PA-C      . piperacillin-tazobactam (ZOSYN) IVPB 3.375 g  3.375 g Intravenous Once Renne CriglerJoshua Geiple, PA-C      . sodium chloride 0.9 % bolus 1,000 mL  1,000 mL Intravenous Once Renne CriglerJoshua Geiple, PA-C      . vancomycin (VANCOCIN) 1,250 mg in sodium chloride 0.9 % 250 mL IVPB  1,250 mg Intravenous Q8H Purvis SheffieldForrest Harrison, MD       Current Outpatient Prescriptions  Medication Sig Dispense Refill  . clindamycin (CLEOCIN) 300 MG capsule Take 300 mg by mouth 3 (three) times daily.    Marland Kitchen. HYDROcodone-acetaminophen (NORCO/VICODIN) 5-325 MG per tablet Take 1-2 tablets by mouth every 6 (six) hours as needed for moderate pain or severe pain. 20 tablet 0  . sodium chloride (OCEAN) 0.65 % SOLN nasal spray Place  1 spray into both nostrils as needed for congestion. 15 mL 0  . amoxicillin-clavulanate (AUGMENTIN) 875-125 MG per tablet Take 1 tablet by mouth 2 (two) times daily. (Patient not taking: Reported on 02/02/2014) 14 tablet 0  . dextromethorphan-guaiFENesin (TUSSIN DM) 10-100 MG/5ML liquid Take 10 mLs by mouth every 4 (four) hours as needed for cough. (Patient not taking: Reported on 02/02/2014) 118 mL 0  . doxycycline (VIBRAMYCIN) 100 MG capsule Take 1 capsule (100 mg total) by mouth 2 (two) times daily. (Patient not taking: Reported on 02/02/2014) 20 capsule 0  . erythromycin ophthalmic ointment Place a 1/2 inch ribbon of ointment into the lower eyelid 4 times a day for 5 days. (Patient not taking: Reported on  02/02/2014) 1 g g  . fluticasone (FLONASE) 50 MCG/ACT nasal spray Place 2 sprays into the nose daily. (Patient not taking: Reported on 02/02/2014) 16 g 0  . fluticasone (FLONASE) 50 MCG/ACT nasal spray Place 1 spray into both nostrils 2 (two) times daily. (Patient not taking: Reported on 02/02/2014) 1 g 2  . oxymetazoline (AFRIN NASAL SPRAY) 0.05 % nasal spray Place 1 spray into both nostrils 2 (two) times daily. Use for only 3 days. (Patient not taking: Reported on 02/02/2014) 30 mL 0   Allergies: Allergies as of 02/02/2014 - Review Complete 02/02/2014  Allergen Reaction Noted  . Diphenhydramine Shortness Of Breath 01/31/2014   Past Medical History  Diagnosis Date  . Hypertension    Past Surgical History  Procedure Laterality Date  . Cesarean section     History reviewed. No pertinent family history. History   Social History  . Marital Status: Single    Spouse Name: N/A    Number of Children: N/A  . Years of Education: N/A   Occupational History  . Not on file.   Social History Main Topics  . Smoking status: Never Smoker   . Smokeless tobacco: Not on file  . Alcohol Use: No  . Drug Use: No  . Sexual Activity: Not on file   Other Topics Concern  . Not on file   Social History Narrative   Review of Systems:  Constitutional:  Denies fever, chills. Does admit to cavities  HEENT:  Sore throat, pain with swallowing, feeling like throat is closing up at times  Respiratory:  Difficult with deep inspiration  Cardiovascular:  Denies chest pain  Gastrointestinal:  Denies nausea, vomiting, abdominal pain  Skin:  Erythema and swelling of neck and left side of face  Neurological:  Denies dizziness, seizures, syncope, weakness, light-headedness, numbness and headaches.    Physical Exam: Blood pressure 142/84, pulse 98, temperature 98.4 F (36.9 C), temperature source Oral, resp. rate 22, height 5\' 4"  (1.626 m), weight 300 lb (136.079 kg), last menstrual period 01/17/2014, SpO2  96 %. Vitals reviewed. General: resting in bed, painful distress HEENT: EOMI, poor dentition with dental caries on b/l sides and darkened tooth, inflammation inside of left cheek, limited ability to open mouth due to pain and swelling, able to bring tongue partially out of mouth, unable to visualize pharynx clearly due to patient discomfort with opening mouth Neck: erythematous and warm to palpation under chin and left side of face, very tender to even light palpation. Extension of edema from under chin to behind left ear and lateral posterior side of neck Cardiac: Tachycardia Pulm: clear to auscultation bilaterally, but very shallow breathing, no evidence of stridor at this time Abd: soft, obese, nontender, BS present Ext: moving all extremities Neuro: alert and oriented  X3, speaking in full sentences     Lab results: Basic Metabolic Panel:  Recent Labs  40/98/1112/27/15 1558 02/02/14 0601  NA  --  133*  K  --  3.5  CL  --  99  CO2  --  25  GLUCOSE  --  139*  BUN  --  8  CREATININE 0.70 0.71  CALCIUM  --  8.8   CBC:  Recent Labs  02/02/14 0601  WBC 12.3*  HGB 12.9  HCT 38.6  MCV 88.9  PLT 269   Imaging results:  Dg Chest 2 View (if Patient Has Fever And/or Copd)  02/02/2014   CLINICAL DATA:  Shortness of breath.  Nasal congestion.  EXAM: CHEST  2 VIEW  COMPARISON:  11/04/2006.  FINDINGS: Heart size is normal. Mediastinal shadows are normal. The patient has taken a poor inspiration. Allowing for that, the lungs are clear. No effusions. No significant bony finding.  IMPRESSION: Poor inspiration.  No active disease suspected.   Electronically Signed   By: Paulina FusiMark  Shogry M.D.   On: 02/02/2014 07:04   Ct Soft Tissue Neck W Contrast  02/02/2014   CLINICAL DATA:  Parotiditis diagnosed 2 days ago on the left. Worsening pain and swelling.  EXAM: CT NECK WITH CONTRAST  TECHNIQUE: Multidetector CT imaging of the neck was performed using the standard protocol following the bolus  administration of intravenous contrast.  CONTRAST:  90mL OMNIPAQUE IOHEXOL 300 MG/ML  SOLN  COMPARISON:  01/31/2014  FINDINGS: Pharynx and larynx: No mucosal or submucosal lesion.  Salivary glands: Again demonstrated is diffuse swelling and hyperemia of the left parotid gland consistent with acute parotiditis. There is edema of the surrounding soft tissues, tracking inferiorly. Regional edema is overall worsened, now with edema seen in the retropharyngeal/prevertebral space. There is no evidence of drainable abscess. Mild regional reactive nodal prominence is demonstrated, as expected. The right parotid gland is within normal limits. Both submandibular glands are normal. There is no evidence of sellar area duct stone.  Thyroid: Symmetric and normal.  Lymph nodes: Mild reactive regional nodal prominence without evidence of worrisome focal enlargement or low density.  Vascular: Arterial and venous structures patent.  Limited intracranial: No abnormality seen.  Mastoids and visualized paranasal sinuses: Clear  Skeleton: No significant finding  Upper chest: Normal  IMPRESSION: Left parotiditis with regional inflammatory edema. The regional edema has worsened, including now development of edema in the retropharyngeal/prevertebral space. There does not appear to be a dominant or apparently drainable collection in any location however. This does suggest that the process may not be responding to the current therapy.   Electronically Signed   By: Paulina FusiMark  Shogry M.D.   On: 02/02/2014 08:23   Ct Maxillofacial W/cm  01/31/2014   CLINICAL DATA:  Left facial swelling for 3 days  EXAM: CT MAXILLOFACIAL WITH CONTRAST  TECHNIQUE: Multidetector CT imaging of the maxillofacial structures was performed with intravenous contrast. Multiplanar CT image reconstructions were also generated. A small metallic BB was placed on the right temple in order to reliably differentiate right from left.  CONTRAST:  75mL OMNIPAQUE IOHEXOL 300 MG/ML   SOLN  COMPARISON:  None.  FINDINGS: Skull base and its contents are within normal limits. The orbits and their contents are unremarkable.  The left parotid gland is enlarged and hyperemic with surrounding info subcutaneous inflammatory changes. These extend inferiorly along the lateral left neck with some mild subcutaneous fluid identified. No focal abscess is seen. These changes are consistent with parotitis with  extension. This extends to the level of the hyoid bone on the left. The vascular structures is visualized are within normal limits. No acute bony abnormality is seen. No significant lymphadenopathy is noted.  IMPRESSION: Changes consistent with parotitis with localized inflammatory extension inferiorly. No abscess is seen.   Electronically Signed   By: Alcide Clever M.D.   On: 01/31/2014 17:11   Other results: EKG: HR 102 sinus tachycardia with TWI III and aVF with flattening in V3  Assessment & Plan by Problem: Principal Problem:   Parotitis Active Problems:   HTN (hypertension)  L parotitis with neck cellulitis--worsening over past 2 days in regards to swelling and now feeling like throat is closing up and SOB. Repeat CT shows worsening of inflammation with extension into retropharyngeal/prevertebral space.  Seen as outpatient by ENT, Dr. Annalee Genta. Initially started on Augmentin then changed to Clindamycin yesterday.   -admit to SDU -continue Vancomycin and Zosyn -Seen by Dr. Carolyn Stare, ENT evaluation--no surgery at this time but close monitoring, continue IV abx and steroids, encourage slowly drinking water if possible. Dr. Annalee Genta or partners can be notified of any changes and will continue to follow -she will need frequent and VERY close airway monitoring, may need surgical I&D if no improvement with IV Abx and steroids -of note, allergy to diphenhydramine in chart -pain control--dilaudid prn for now; may need to adjust dose and frequency based on pain -schedule decadron 10mg  iv  q8h--written for 3 doses for now, may need more doses -I did discuss possibility of emergent surgery or airway if needed which she is in agreement too. Will try to get bedside airway cart in room if needed for emergent procedure and will try to notify PCCM as well in the event of emergency for airway -will have primary team reassess and adjust steroids and pain medication as needed -sialagogues and warm compress to area as tolerated  HTN--noted in hx. Not on any current medication. BP mildly elevated on admission 142/84 likely in setting of pain and current infection.  -continue to monitor  Diet: clear liquids for now DVT PPx: Lovenox Dispo: Disposition is deferred at this time, awaiting improvement of current medical problems and clinical course.   The patient does not have a current PCP (No Pcp Per Patient) but wishes to establish with [facility] physicians for  hospital follow-up appointment after discharge.  The patient does not have transportation limitations that hinder transportation to clinic appointments.  Signed: Baltazar Apo, MD 02/02/2014, 10:13 AM

## 2014-02-03 DIAGNOSIS — L0390 Cellulitis, unspecified: Secondary | ICD-10-CM

## 2014-02-03 DIAGNOSIS — R7309 Other abnormal glucose: Secondary | ICD-10-CM

## 2014-02-03 LAB — BASIC METABOLIC PANEL
Anion gap: 7 (ref 5–15)
BUN: 8 mg/dL (ref 6–23)
CALCIUM: 8.7 mg/dL (ref 8.4–10.5)
CO2: 23 mmol/L (ref 19–32)
Chloride: 106 mEq/L (ref 96–112)
Creatinine, Ser: 0.51 mg/dL (ref 0.50–1.10)
GFR calc Af Amer: >90 mL/min (ref >90)
GFR calc non Af Amer: >90 mL/min (ref >90)
GLUCOSE: 145 mg/dL — AB (ref 70–99)
Potassium: 4.1 mmol/L (ref 3.5–5.1)
SODIUM: 136 mmol/L (ref 135–145)

## 2014-02-03 LAB — CBC
HCT: 37.3 % (ref 36.0–46.0)
Hemoglobin: 12.3 g/dL (ref 12.0–15.0)
MCH: 29.5 pg (ref 26.0–34.0)
MCHC: 33 g/dL (ref 30.0–36.0)
MCV: 89.4 fL (ref 78.0–100.0)
Platelets: 309 10*3/uL (ref 150–400)
RBC: 4.17 MIL/uL (ref 3.87–5.11)
RDW: 12.7 % (ref 11.5–15.5)
WBC: 14.2 10*3/uL — AB (ref 4.0–10.5)

## 2014-02-03 LAB — HEMOGLOBIN A1C
Hgb A1c MFr Bld: 5.8 % — ABNORMAL HIGH (ref <5.7)
Mean Plasma Glucose: 120 mg/dL — ABNORMAL HIGH (ref <117)

## 2014-02-03 LAB — HIV ANTIBODY (ROUTINE TESTING W REFLEX): HIV 1&2 Ab, 4th Generation: NONREACTIVE

## 2014-02-03 MED ORDER — HYDROMORPHONE HCL 1 MG/ML IJ SOLN
0.5000 mg | INTRAMUSCULAR | Status: DC | PRN
  Administered 2014-02-03 – 2014-02-04 (×4): 0.5 mg via INTRAVENOUS
  Filled 2014-02-03 (×4): qty 1

## 2014-02-03 MED ORDER — DEXAMETHASONE SODIUM PHOSPHATE 10 MG/ML IJ SOLN
10.0000 mg | Freq: Three times a day (TID) | INTRAMUSCULAR | Status: AC
  Administered 2014-02-03 – 2014-02-04 (×5): 10 mg via INTRAVENOUS
  Filled 2014-02-03 (×5): qty 1

## 2014-02-03 NOTE — Progress Notes (Signed)
   ENT Progress Note: HD #1    Subjective: Cont ST and facial swelling  Objective: Vital signs in last 24 hours: Temp:  [97.5 F (36.4 C)-98.4 F (36.9 C)] 98.4 F (36.9 C) (12/30 0813) Pulse Rate:  [57-108] 88 (12/30 0813) Resp:  [17-19] 19 (12/29 1600) BP: (127-160)/(65-87) 156/87 mmHg (12/30 0813) SpO2:  [92 %-98 %] 92 % (12/30 0813) Weight change:  Last BM Date: 02/01/14  Intake/Output from previous day: 12/29 0701 - 12/30 0700 In: 3570.8 [P.O.:960; I.V.:2010.8; IV Piggyback:600] Out: -  Intake/Output this shift:    Labs:  Recent Labs  02/02/14 0601 02/03/14 0253  WBC 12.3* 14.2*  HGB 12.9 12.3  HCT 38.6 37.3  PLT 269 309    Recent Labs  02/02/14 0601 02/03/14 0253  NA 133* 136  K 3.5 4.1  CL 99 106  CO2 25 23  GLUCOSE 139* 145*  BUN 8 8  CALCIUM 8.8 8.7    Studies/Results: Dg Chest 2 View (if Patient Has Fever And/or Copd)  02/02/2014   CLINICAL DATA:  Shortness of breath.  Nasal congestion.  EXAM: CHEST  2 VIEW  COMPARISON:  11/04/2006.  FINDINGS: Heart size is normal. Mediastinal shadows are normal. The patient has taken a poor inspiration. Allowing for that, the lungs are clear. No effusions. No significant bony finding.  IMPRESSION: Poor inspiration.  No active disease suspected.   Electronically Signed   By: Paulina FusiMark  Shogry M.D.   On: 02/02/2014 07:04   Ct Soft Tissue Neck W Contrast  02/02/2014   CLINICAL DATA:  Parotiditis diagnosed 2 days ago on the left. Worsening pain and swelling.  EXAM: CT NECK WITH CONTRAST  TECHNIQUE: Multidetector CT imaging of the neck was performed using the standard protocol following the bolus administration of intravenous contrast.  CONTRAST:  90mL OMNIPAQUE IOHEXOL 300 MG/ML  SOLN  COMPARISON:  01/31/2014  FINDINGS: Pharynx and larynx: No mucosal or submucosal lesion.  Salivary glands: Again demonstrated is diffuse swelling and hyperemia of the left parotid gland consistent with acute parotiditis. There is edema of  the surrounding soft tissues, tracking inferiorly. Regional edema is overall worsened, now with edema seen in the retropharyngeal/prevertebral space. There is no evidence of drainable abscess. Mild regional reactive nodal prominence is demonstrated, as expected. The right parotid gland is within normal limits. Both submandibular glands are normal. There is no evidence of sellar area duct stone.  Thyroid: Symmetric and normal.  Lymph nodes: Mild reactive regional nodal prominence without evidence of worrisome focal enlargement or low density.  Vascular: Arterial and venous structures patent.  Limited intracranial: No abnormality seen.  Mastoids and visualized paranasal sinuses: Clear  Skeleton: No significant finding  Upper chest: Normal  IMPRESSION: Left parotiditis with regional inflammatory edema. The regional edema has worsened, including now development of edema in the retropharyngeal/prevertebral space. There does not appear to be a dominant or apparently drainable collection in any location however. This does suggest that the process may not be responding to the current therapy.   Electronically Signed   By: Paulina FusiMark  Shogry M.D.   On: 02/02/2014 08:23     PHYSICAL EXAM: Left perifacial swelling, tender to palpation OP stable, airway stable   Assessment/Plan: Afeb, WBC increased to 14.2 No clinical change after 24 hrs of IV abx. Cont current abx tx and IV hydration - encourage po fluids and sialogogues. ENT Svc will follow over the [place] weekend    Darlene Rodriguez 02/03/2014, 10:48 AM

## 2014-02-03 NOTE — Progress Notes (Signed)
Spoke with pharmacy about the lozenges requested by Dr. Annalee GentaShoemaker. Pharmacy was unable to locate something suitable. Inquired with dietary who also had nothing suitable. Pt's family said someone would be able to bring lozenges to the hospital.

## 2014-02-03 NOTE — Progress Notes (Signed)
Subjective:  Patient was seen and examined this morning. Patient states that she is improved from yesterday and feels that her throat is opening up. She denies any fever, chills, headache, shortness of breath or trouble breathing. She is able to swallow and tolerated scrambled eggs this morning, but does admits to dysphagia when swallowing.   Objective: Vital signs in last 24 hours: Filed Vitals:   02/03/14 0600 02/03/14 0813 02/03/14 1211 02/03/14 1213  BP:  156/87  154/91  Pulse: 88 88  79  Temp: 97.5 F (36.4 C) 98.4 F (36.9 C)  98.1 F (36.7 C)  TempSrc: Oral Oral Oral Oral  Resp:      Height:      Weight:      SpO2: 94% 92%  96%   General: Vital signs reviewed.  Patient is well-developed and well-nourished, in no acute distress and cooperative with exam.  HEENT: Normocephalic and atraumatic. EOMI, conjunctivae normal. Supple, obese, trachea midline, normal ROM, no stridor. Increased warmth on left side with edematous parotid gland.  Cardiovascular: RRR, S1 normal, S2 normal, no murmurs, gallops, or rubs. Pulmonary/Chest: Clear to auscultation bilaterally, no wheezes, rales, or rhonchi. Abdominal: Soft, non-tender, non-distended, BS + Extremities: No lower extremity edema bilaterally Neurological: A&O x3 Psychiatric: Normal mood and affect. speech and behavior is normal. Cognition and memory are normal.   Lab Results: Basic Metabolic Panel:  Recent Labs Lab 02/02/14 0601 02/03/14 0253  NA 133* 136  K 3.5 4.1  CL 99 106  CO2 25 23  GLUCOSE 139* 145*  BUN 8 8  CREATININE 0.71 0.51  CALCIUM 8.8 8.7   CBC:  Recent Labs Lab 02/02/14 0601 02/03/14 0253  WBC 12.3* 14.2*  HGB 12.9 12.3  HCT 38.6 37.3  MCV 88.9 89.4  PLT 269 309   Thyroid Function Tests:  Recent Labs Lab 02/02/14 1530  TSH 1.345   Micro Results: Recent Results (from the past 240 hour(s))  MRSA PCR Screening     Status: None   Collection Time: 02/02/14 11:59 AM  Result Value Ref  Range Status   MRSA by PCR NEGATIVE NEGATIVE Final    Comment:        The GeneXpert MRSA Assay (FDA approved for NASAL specimens only), is one component of a comprehensive MRSA colonization surveillance program. It is not intended to diagnose MRSA infection nor to guide or monitor treatment for MRSA infections.    Studies/Results: Dg Chest 2 View (if Patient Has Fever And/or Copd)  02/02/2014   CLINICAL DATA:  Shortness of breath.  Nasal congestion.  EXAM: CHEST  2 VIEW  COMPARISON:  11/04/2006.  FINDINGS: Heart size is normal. Mediastinal shadows are normal. The patient has taken a poor inspiration. Allowing for that, the lungs are clear. No effusions. No significant bony finding.  IMPRESSION: Poor inspiration.  No active disease suspected.   Electronically Signed   By: Paulina FusiMark  Shogry M.D.   On: 02/02/2014 07:04   Ct Soft Tissue Neck W Contrast  02/02/2014   CLINICAL DATA:  Parotiditis diagnosed 2 days ago on the left. Worsening pain and swelling.  EXAM: CT NECK WITH CONTRAST  TECHNIQUE: Multidetector CT imaging of the neck was performed using the standard protocol following the bolus administration of intravenous contrast.  CONTRAST:  90mL OMNIPAQUE IOHEXOL 300 MG/ML  SOLN  COMPARISON:  01/31/2014  FINDINGS: Pharynx and larynx: No mucosal or submucosal lesion.  Salivary glands: Again demonstrated is diffuse swelling and hyperemia of the left parotid gland consistent  with acute parotiditis. There is edema of the surrounding soft tissues, tracking inferiorly. Regional edema is overall worsened, now with edema seen in the retropharyngeal/prevertebral space. There is no evidence of drainable abscess. Mild regional reactive nodal prominence is demonstrated, as expected. The right parotid gland is within normal limits. Both submandibular glands are normal. There is no evidence of sellar area duct stone.  Thyroid: Symmetric and normal.  Lymph nodes: Mild reactive regional nodal prominence without  evidence of worrisome focal enlargement or low density.  Vascular: Arterial and venous structures patent.  Limited intracranial: No abnormality seen.  Mastoids and visualized paranasal sinuses: Clear  Skeleton: No significant finding  Upper chest: Normal  IMPRESSION: Left parotiditis with regional inflammatory edema. The regional edema has worsened, including now development of edema in the retropharyngeal/prevertebral space. There does not appear to be a dominant or apparently drainable collection in any location however. This does suggest that the process may not be responding to the current therapy.   Electronically Signed   By: Paulina FusiMark  Shogry M.D.   On: 02/02/2014 08:23   Medications:  I have reviewed the patient's current medications. Prior to Admission:  Prescriptions prior to admission  Medication Sig Dispense Refill Last Dose  . clindamycin (CLEOCIN) 300 MG capsule Take 300 mg by mouth 3 (three) times daily.   02/01/2014 at Unknown time  . HYDROcodone-acetaminophen (NORCO/VICODIN) 5-325 MG per tablet Take 1-2 tablets by mouth every 6 (six) hours as needed for moderate pain or severe pain. 20 tablet 0 02/02/2014 at Unknown time  . sodium chloride (OCEAN) 0.65 % SOLN nasal spray Place 1 spray into both nostrils as needed for congestion. 15 mL 0 Past Month at Unknown time  . amoxicillin-clavulanate (AUGMENTIN) 875-125 MG per tablet Take 1 tablet by mouth 2 (two) times daily. (Patient not taking: Reported on 02/02/2014) 14 tablet 0   . dextromethorphan-guaiFENesin (TUSSIN DM) 10-100 MG/5ML liquid Take 10 mLs by mouth every 4 (four) hours as needed for cough. (Patient not taking: Reported on 02/02/2014) 118 mL 0   . doxycycline (VIBRAMYCIN) 100 MG capsule Take 1 capsule (100 mg total) by mouth 2 (two) times daily. (Patient not taking: Reported on 02/02/2014) 20 capsule 0   . erythromycin ophthalmic ointment Place a 1/2 inch ribbon of ointment into the lower eyelid 4 times a day for 5 days. (Patient not  taking: Reported on 02/02/2014) 1 g g   . fluticasone (FLONASE) 50 MCG/ACT nasal spray Place 1 spray into both nostrils 2 (two) times daily. (Patient not taking: Reported on 02/02/2014) 1 g 2   . oxymetazoline (AFRIN NASAL SPRAY) 0.05 % nasal spray Place 1 spray into both nostrils 2 (two) times daily. Use for only 3 days. (Patient not taking: Reported on 02/02/2014) 30 mL 0    Scheduled Meds: . dexamethasone  10 mg Intravenous Q8H  . heparin subcutaneous  5,000 Units Subcutaneous 3 times per day  . piperacillin-tazobactam (ZOSYN)  IV  3.375 g Intravenous 3 times per day  . potassium chloride  40 mEq Oral Once  . vancomycin  1,250 mg Intravenous Q8H   Continuous Infusions: . sodium chloride 75 mL/hr (02/03/14 0021)   PRN Meds:.HYDROmorphone (DILAUDID) injection Assessment/Plan: Principal Problem:   Acute parotitis Active Problems:   Morbid obesity  L Acute Parotitis with Cellulitis: Improving. Patient continues to have mild pain and edema, but no shortness of breath. She feels that her airway is "opening back up." No stridor or concerning symptoms of airway compromise. Patient is stable  to be transferred out of SDU and continue on antibiotics, steroids and pain medication. -Transfer out of SDU -Vancomycin/Zosyn -Close airway monitoring -Dilaudid 0.5 mg Q3H prn -Decadron 10 mg IV Q8H -Sialagogues  -Warm compresses to area   HTN: BP elevated at 156/87>154/91 this morning. This could be secondary to pain and steroids. Patient is not on any antihypertensives at home. Patient will follow up with PCP to have this evaluated.  -Continue to monitor  Pre-diabetes: Hgb A1c 5.8. Patient will have follow up with PCP. -Counseled on diet and exercise once over acute disease -Monitor  Diet: Regular  DVT/PE PPx: Heparin TID  Dispo: Disposition is deferred at this time, awaiting improvement of current medical problems.  Anticipated discharge in approximately 1-2 day(s).   The patient does not  have a current PCP (No Pcp Per Patient) and does need an [facility] hospital follow-up appointment after discharge.  The patient does not have transportation limitations that hinder transportation to clinic appointments.  .Services Needed at time of discharge: Y = Yes, Blank = No PT:   OT:   RN:   Equipment:   Other:     LOS: 1 day   Jill Alexanders, DO PGY-1 Internal Medicine Resident Pager # [phone] 02/03/2014 12:33 PM

## 2014-02-04 MED ORDER — HYDROCODONE-ACETAMINOPHEN 5-325 MG PO TABS
1.0000 | ORAL_TABLET | Freq: Four times a day (QID) | ORAL | Status: DC | PRN
  Administered 2014-02-04 (×2): 2 via ORAL
  Filled 2014-02-04 (×2): qty 2

## 2014-02-04 MED ORDER — AMOXICILLIN-POT CLAVULANATE 875-125 MG PO TABS
1.0000 | ORAL_TABLET | Freq: Two times a day (BID) | ORAL | Status: DC
  Administered 2014-02-04 – 2014-02-05 (×2): 1 via ORAL
  Filled 2014-02-04 (×3): qty 1

## 2014-02-04 NOTE — Progress Notes (Signed)
Patient ambulated from room to end of hall x 2 without assistance. Tolerated well.

## 2014-02-04 NOTE — Care Management Note (Addendum)
CARE MANAGEMENT NOTE 02/04/2014  Patient:  Darlene Rodriguez,Darlene Rodriguez   Account Number:  [id number]  Date Initiated:  02/04/2014  Documentation initiated by:  Katlin Bortner  Subjective/Objective Assessment:   CM following for progression and d/c planning.     Action/Plan:   Noted that pt has no insurance provided per registration. Also noted plan to followup at [facility]. Will this pt need assistance with meds or followup with clinic?  02/04/2014 Spoke with pt who has [employer] insurance and will be able to get meds as needed.    Anticipated DC Date:     Anticipated DC Plan:  HOME/SELF CARE         Choice offered to / List presented to:             Status of service:  In process, will continue to follow Medicare Important Message given?  NO (If response is "NO", the following Medicare IM given date fields will be blank) Date Medicare IM given:   Medicare IM given by:   Date Additional Medicare IM given:   Additional Medicare IM given by:    Discharge Disposition:    Per UR Regulation:    If discussed at Long Length of Stay Meetings, dates discussed:    Comments:   Spoke with pt who has [employer] insurance.

## 2014-02-04 NOTE — Progress Notes (Signed)
Subjective:  Patient was seen and examined this morning. She reports improvement in swelling and pain. However, the pain has moved from her left face to the back of her neck. She denies fever or chills. She is tolerating food by mouth and denies any other symptoms. No shortness of breath or difficulty breathing.   Objective: Vital signs in last 24 hours: Filed Vitals:   02/04/14 0849 02/04/14 1115 02/04/14 1118 02/04/14 1120  BP: 212/61 144/95 146/97 158/105  Pulse: 64 65 70 80  Temp: 97.9 F (36.6 C)     TempSrc: Oral     Resp: 21 21 23 22   Height:      Weight:      SpO2: 98% 96% 100% 97%   General: Vital signs reviewed.  Patient is well-developed and well-nourished, in no acute distress and cooperative with exam.  HEENT: Normocephalic and atraumatic. EOMI, conjunctivae normal. Supple, obese, trachea midline, normal ROM, no stridor. Increased warmth on left side with edematous parotid gland-improving.  Cardiovascular: RRR, S1 normal, S2 normal, no murmurs, gallops, or rubs. Pulmonary/Chest: Clear to auscultation bilaterally, no wheezes, rales, or rhonchi. Abdominal: Soft, non-tender, non-distended, BS + Extremities: No lower extremity edema bilaterally Neurological: A&O x3 Psychiatric: Normal mood and affect. speech and behavior is normal. Cognition and memory are normal.   Lab Results: Basic Metabolic Panel:  Recent Labs Lab 02/02/14 0601 02/03/14 0253  NA 133* 136  K 3.5 4.1  CL 99 106  CO2 25 23  GLUCOSE 139* 145*  BUN 8 8  CREATININE 0.71 0.51  CALCIUM 8.8 8.7   CBC:  Recent Labs Lab 02/02/14 0601 02/03/14 0253  WBC 12.3* 14.2*  HGB 12.9 12.3  HCT 38.6 37.3  MCV 88.9 89.4  PLT 269 309   Thyroid Function Tests:  Recent Labs Lab 02/02/14 1530  TSH 1.345   Micro Results: Recent Results (from the past 240 hour(s))  MRSA PCR Screening     Status: None   Collection Time: 02/02/14 11:59 AM  Result Value Ref Range Status   MRSA by PCR NEGATIVE  NEGATIVE Final    Comment:        The GeneXpert MRSA Assay (FDA approved for NASAL specimens only), is one component of a comprehensive MRSA colonization surveillance program. It is not intended to diagnose MRSA infection nor to guide or monitor treatment for MRSA infections.    Studies/Results: No results found. Medications:  I have reviewed the patient's current medications. Prior to Admission:  Prescriptions prior to admission  Medication Sig Dispense Refill Last Dose  . clindamycin (CLEOCIN) 300 MG capsule Take 300 mg by mouth 3 (three) times daily.   02/01/2014 at Unknown time  . HYDROcodone-acetaminophen (NORCO/VICODIN) 5-325 MG per tablet Take 1-2 tablets by mouth every 6 (six) hours as needed for moderate pain or severe pain. 20 tablet 0 02/02/2014 at Unknown time  . sodium chloride (OCEAN) 0.65 % SOLN nasal spray Place 1 spray into both nostrils as needed for congestion. 15 mL 0 Past Month at Unknown time  . amoxicillin-clavulanate (AUGMENTIN) 875-125 MG per tablet Take 1 tablet by mouth 2 (two) times daily. (Patient not taking: Reported on 02/02/2014) 14 tablet 0   . dextromethorphan-guaiFENesin (TUSSIN DM) 10-100 MG/5ML liquid Take 10 mLs by mouth every 4 (four) hours as needed for cough. (Patient not taking: Reported on 02/02/2014) 118 mL 0   . doxycycline (VIBRAMYCIN) 100 MG capsule Take 1 capsule (100 mg total) by mouth 2 (two) times daily. (Patient not taking:  Reported on 02/02/2014) 20 capsule 0   . erythromycin ophthalmic ointment Place a 1/2 inch ribbon of ointment into the lower eyelid 4 times a day for 5 days. (Patient not taking: Reported on 02/02/2014) 1 g g   . fluticasone (FLONASE) 50 MCG/ACT nasal spray Place 1 spray into both nostrils 2 (two) times daily. (Patient not taking: Reported on 02/02/2014) 1 g 2   . oxymetazoline (AFRIN NASAL SPRAY) 0.05 % nasal spray Place 1 spray into both nostrils 2 (two) times daily. Use for only 3 days. (Patient not taking:  Reported on 02/02/2014) 30 mL 0    Scheduled Meds: . amoxicillin-clavulanate  1 tablet Oral Q12H  . dexamethasone  10 mg Intravenous Q8H  . heparin subcutaneous  5,000 Units Subcutaneous 3 times per day  . potassium chloride  40 mEq Oral Once   Continuous Infusions: . sodium chloride 75 mL/hr at 02/03/14 1640   PRN Meds:.HYDROcodone-acetaminophen Assessment/Plan: Principal Problem:   Acute parotitis Active Problems:   Morbid obesity  L Acute Parotitis with Cellulitis: Patient was seen by Dr. Annalee GentaShoemaker, ENT, yesterday who recommended continuing antibiotics, IV hydration and to encourage po fluids and sialogogues. Patient is improved today. Edema and pain have improved and patient denies any shortness of breath or difficulty breathing. She is tolerating a regular diet and was able to eat grilled chicken last night. After discussing with Infectious Disease, they recommend discontinuing vanc/zosyn and starting Augmentin 875-125 BID. -Augmentin 875-125 mg BID for a total abx therapy of 10-14 days (start: 01/31/14; end: 02/13/14) -Airway monitoring -Norco 5-325 1-2 tabs Q6H -Decadron 10 mg IV Q8H -Sialagogues (lemon drops) -Warm compresses to area  -ENT following, appreciate recommendations  HTN: BP 146/85 this morning. Patient is not on any antihypertensives at home. Patient will follow up with PCP to have this evaluated. Patient has a new patient appointment with [facility] to establish care.  -Continue to monitor  Pre-diabetes: Hgb A1c 5.8. Patient will have follow up with PCP. Patient has a new patient appointment with [facility] to establish care.  -Counseled on diet and exercise  -Monitor  Diet: Regular  DVT/PE PPx: Heparin TID  Dispo: Disposition is deferred at this time, awaiting improvement of current medical problems.  Anticipated discharge in approximately 1-2 day(s).   The patient does not have a current PCP (No Pcp Per Patient) and [facility].  The patient does not have transportation limitations that hinder transportation to clinic appointments.  .Services Needed at time of discharge: Y = Yes, Blank = No PT:   OT:   RN:   Equipment:   Other:     LOS: 2 days   Jill AlexandersAlexa Richardson, DO PGY-1 Internal Medicine Resident Pager # [phone] 02/04/2014 11:58 AM

## 2014-02-05 DIAGNOSIS — R7303 Prediabetes: Secondary | ICD-10-CM

## 2014-02-05 MED ORDER — DEXAMETHASONE 4 MG PO TABS
4.0000 mg | ORAL_TABLET | Freq: Once | ORAL | Status: AC
  Administered 2014-02-05: 4 mg via ORAL
  Filled 2014-02-05: qty 1

## 2014-02-05 MED ORDER — DEXAMETHASONE 1 MG PO TABS
1.0000 mg | ORAL_TABLET | Freq: Two times a day (BID) | ORAL | Status: DC
Start: 2014-02-05 — End: 2016-01-20

## 2014-02-05 MED ORDER — AMOXICILLIN-POT CLAVULANATE 875-125 MG PO TABS: 1.0000 | ORAL_TABLET | Freq: Two times a day (BID) | ORAL | Status: DC

## 2014-02-05 MED ORDER — LISINOPRIL 5 MG PO TABS: 5.0000 mg | ORAL_TABLET | Freq: Every day | ORAL | Status: DC

## 2014-02-05 MED ORDER — DEXAMETHASONE 1 MG PO TABS: 1.0000 mg | ORAL_TABLET | Freq: Two times a day (BID) | ORAL | Status: DC

## 2014-02-05 NOTE — Discharge Instructions (Signed)
Thank you for allowing [place] to be involved in your healthcare while you were hospitalized at [facility].   Please note that there have been changes to your home medications.  --> PLEASE LOOK AT YOUR DISCHARGE MEDICATION LIST FOR DETAILS.  Please call your PCP if you have any questions or concerns, or any difficulty getting any of your medications.  Please return to the ER if you have worsening of your symptoms or new severe symptoms arise.  FOR YOUR PAROTID GLAND INFECTION: -Follow up with Dr. Annalee Rodriguez -Continue warm compresses and lemon drops -Continue taking Augmentin 1 pill twice a day for 8 more days (stop date 1/9/12016-when you run out of pills) -Take Dexamethasone 1 mg pills as follows: January 1 (Day 1): Take 4 mg (4 pills) with food at night. January 2 (Day 2): Take 4 mg (4 pills) in the morning with food and 4 mg (4 pills) at night with food.  January 3 (Day 3): Take 2 mg (2 pills) in the morning with food and 2 mg (2 pills) at night with food. January 4 (Day 4): Take 2 mg (2 pills) in the morning with food and 2 mg (2 pills) at night with food. January 5 (Day 5): Take 1 mg (1 pill) in the morning with food and 1 mg (1 pill) at night with food. January 6 (Day 6): Take 1 mg (1 pill) in the morning with food and 1 mg (1 pill) at night with food.  For your Pre-Diabetes: follow up with the primary care physician and monitor you diet with advice listed below. Continue to stay active and exercise.  For your blood pressure: take lisinopril 5 mg daily and follow up with your primary care physician.   Diabetes and Exercise Exercising regularly is important. It is not just about losing weight. It has many health benefits, such as:  Improving your overall fitness, flexibility, and endurance.  Increasing your bone density.  Helping with weight control.  Decreasing your body fat.  Increasing your muscle strength.  Reducing stress and tension.  Improving your  overall health. People with diabetes who exercise gain additional benefits because exercise:  Reduces appetite.  Improves the body's use of blood sugar (glucose).  Helps lower or control blood glucose.  Decreases blood pressure.  Helps control blood lipids (such as cholesterol and triglycerides).  Improves the body's use of the hormone insulin by:  Increasing the body's insulin sensitivity.  Reducing the body's insulin needs.  Decreases the risk for heart disease because exercising:  Lowers cholesterol and triglycerides levels.  Increases the levels of good cholesterol (such as high-density lipoproteins [HDL]) in the body.  Lowers blood glucose levels. YOUR ACTIVITY PLAN  Choose an activity that you enjoy and set realistic goals. Your health care provider or diabetes educator can help you make an activity plan that works for you. Exercise regularly as directed by your health care provider. This includes:  Performing resistance training twice a week such as push-ups, sit-ups, lifting weights, or using resistance bands.  Performing 150 minutes of cardio exercises each week such as walking, running, or playing sports.  Staying active and spending no more than 90 minutes at one time being inactive. Even short bursts of exercise are good for you. Three 10-minute sessions spread throughout the day are just as beneficial as a single 30-minute session. Some exercise ideas include:  Taking the dog for a walk.  Taking the stairs instead of the elevator.  Dancing to your  favorite song.  Doing an exercise video.  Doing your favorite exercise with a friend. RECOMMENDATIONS FOR EXERCISING WITH TYPE 1 OR TYPE 2 DIABETES   Check your blood glucose before exercising. If blood glucose levels are greater than 240 mg/dL, check for urine ketones. Do not exercise if ketones are present.  Avoid injecting insulin into areas of the body that are going to be exercised. For example, avoid  injecting insulin into:  The arms when playing tennis.  The legs when jogging.  Keep a record of:  Food intake before and after you exercise.  Expected peak times of insulin action.  Blood glucose levels before and after you exercise.  The type and amount of exercise you have done.  Review your records with your health care provider. Your health care provider will help you to develop guidelines for adjusting food intake and insulin amounts before and after exercising.  If you take insulin or oral hypoglycemic agents, watch for signs and symptoms of hypoglycemia. They include:  Dizziness.  Shaking.  Sweating.  Chills.  Confusion.  Drink plenty of water while you exercise to prevent dehydration or heat stroke. Body water is lost during exercise and must be replaced.  Talk to your health care provider before starting an exercise program to make sure it is safe for you. Remember, almost any type of activity is better than none. Document Released: 04/14/2003 Document Revised: 06/08/2013 Document Reviewed: 07/01/2012 [facility] Patient Information 2015 [place], [place]. This information is not intended to replace advice given to you by your health care provider. Make sure you discuss any questions you have with your health care provider.  Calorie Counting for Weight Loss Calories are energy you get from the things you eat and drink. Your body uses this energy to keep you going throughout the day. The number of calories you eat affects your weight. When you eat more calories than your body needs, your body stores the extra calories as fat. When you eat fewer calories than your body needs, your body Ruthene Methvin fat to get the energy it needs. Calorie counting means keeping track of how many calories you eat and drink each day. If you make sure to eat fewer calories than your body needs, you should lose weight. In order for calorie counting to work, you will need to eat the number of calories  that are right for you in a day to lose a healthy amount of weight per week. A healthy amount of weight to lose per week is usually 1-2 lb (0.5-0.9 kg). A dietitian can determine how many calories you need in a day and give you suggestions on how to reach your calorie goal.  WHAT IS MY MY PLAN? My goal is to have __________ calories per day.  If I have this many calories per day, I should lose around __________ pounds per week. WHAT DO I NEED TO KNOW ABOUT CALORIE COUNTING? In order to meet your daily calorie goal, you will need to:  Find out how many calories are in each food you would like to eat. Try to do this before you eat.  Decide how much of the food you can eat.  Write down what you ate and how many calories it had. Doing this is called keeping a food log. WHERE DO I FIND CALORIE INFORMATION? The number of calories in a food can be found on a Nutrition Facts label. Note that all the information on a label is based on a specific serving of  the food. If a food does not have a Nutrition Facts label, try to look up the calories online or ask your dietitian for help. HOW DO I DECIDE HOW MUCH TO EAT? To decide how much of the food you can eat, you will need to consider both the number of calories in one serving and the size of one serving. This information can be found on the Nutrition Facts label. If a food does not have a Nutrition Facts label, look up the information online or ask your dietitian for help. Remember that calories are listed per serving. If you choose to have more than one serving of a food, you will have to multiply the calories per serving by the amount of servings you plan to eat. For example, the label on a package of bread might say that a serving size is 1 slice and that there are 90 calories in a serving. If you eat 1 slice, you will have eaten 90 calories. If you eat 2 slices, you will have eaten 180 calories. HOW DO I KEEP A FOOD LOG? After each meal, record the  following information in your food log:  What you ate.  How much of it you ate.  How many calories it had.  Then, add up your calories. Keep your food log near you, such as in a small notebook in your pocket. Another option is to use a mobile app or website. Some programs will calculate calories for you and show you how many calories you have left each time you add an item to the log. WHAT ARE SOME CALORIE COUNTING TIPS?  Use your calories on foods and drinks that will fill you up and not leave you hungry. Some examples of this include foods like nuts and nut butters, vegetables, lean proteins, and high-fiber foods (more than 5 g fiber per serving).  Eat nutritious foods and avoid empty calories. Empty calories are calories you get from foods or beverages that do not have many nutrients, such as candy and soda. It is better to have a nutritious high-calorie food (such as an avocado) than a food with few nutrients (such as a bag of chips).  Know how many calories are in the foods you eat most often. This way, you do not have to look up how many calories they have each time you eat them.  Look out for foods that may seem like low-calorie foods but are really high-calorie foods, such as baked goods, soda, and fat-free candy.  Pay attention to calories in drinks. Drinks such as sodas, specialty coffee drinks, alcohol, and juices have a lot of calories yet do not fill you up. Choose low-calorie drinks like water and diet drinks.  Focus your calorie counting efforts on higher calorie items. Logging the calories in a garden salad that contains only vegetables is less important than calculating the calories in a milk shake.  Find a way of tracking calories that works for you. Get creative. Most people who are successful find ways to keep track of how much they eat in a day, even if they do not count every calorie. WHAT ARE SOME PORTION CONTROL TIPS?  Know how many calories are in a serving. This  will help you know how many servings of a certain food you can have.  Use a measuring cup to measure serving sizes. This is helpful when you start out. With time, you will be able to estimate serving sizes for some foods.  Take some time  to put servings of different foods on your favorite plates, bowls, and cups so you know what a serving looks like.  Try not to eat straight from a bag or box. Doing this can lead to overeating. Put the amount you would like to eat in a cup or on a plate to make sure you are eating the right portion.  Use smaller plates, glasses, and bowls to prevent overeating. This is a quick and easy way to practice portion control. If your plate is smaller, less food can fit on it.  Try not to multitask while eating, such as watching TV or using your computer. If it is time to eat, sit down at a table and enjoy your food. Doing this will help you to start recognizing when you are full. It will also make you more aware of what and how much you are eating. HOW CAN I CALORIE COUNT WHEN EATING OUT?  Ask for smaller portion sizes or child-sized portions.  Consider sharing an entree and sides instead of getting your own entree.  If you get your own entree, eat only half. Ask for a box at the beginning of your meal and put the rest of your entree in it so you are not tempted to eat it.  Look for the calories on the menu. If calories are listed, choose the lower calorie options.  Choose dishes that include vegetables, fruits, whole grains, low-fat dairy products, and lean protein. Focusing on smart food choices from each of the 5 food groups can help you stay on track at restaurants.  Choose items that are boiled, broiled, grilled, or steamed.  Choose water, milk, unsweetened iced tea, or other drinks without added sugars. If you want an alcoholic beverage, choose a lower calorie option. For example, a regular margarita can have up to 700 calories and a glass of wine has around  150.  Stay away from items that are buttered, battered, fried, or served with cream sauce. Items labeled "crispy" are usually fried, unless stated otherwise.  Ask for dressings, sauces, and syrups on the side. These are usually very high in calories, so do not eat much of them.  Watch out for salads. Many people think salads are a healthy option, but this is often not the case. Many salads come with bacon, fried chicken, lots of cheese, fried chips, and dressing. All of these items have a lot of calories. If you want a salad, choose a garden salad and ask for grilled meats or steak. Ask for the dressing on the side, or ask for olive oil and vinegar or lemon to use as dressing.  Estimate how many servings of a food you are given. For example, a serving of cooked rice is  cup or about the size of half a tennis ball or one cupcake wrapper. Knowing serving sizes will help you be aware of how much food you are eating at restaurants. The list below tells you how big or small some common portion sizes are based on everyday objects.  1 oz--4 stacked dice.  3 oz--1 deck of cards.  1 tsp--1 dice.  1 Tbsp-- a Ping-Pong ball.  2 Tbsp--1 Ping-Pong ball.   cup--1 tennis ball or 1 cupcake wrapper.  1 cup--1 baseball. Document Released: 01/22/2005 Document Revised: 06/08/2013 Document Reviewed: 11/27/2012 [facility] Patient Information 2015 [place], [place]. This information is not intended to replace advice given to you by your health care provider. Make sure you discuss any questions you have with your  health care provider.   Parotitis Parotitis is soreness and inflammation of one or both parotid glands. The parotid glands produce saliva. They are located on each side of the face, below and in front of the earlobes. The saliva produced comes out of tiny openings (ducts) inside the cheeks. In most cases, parotitis goes away over time or with treatment. If your parotitis is caused by certain long-term  (chronic) diseases, it may come back again.  CAUSES  Parotitis can be caused by:  Viral infections. Mumps is one viral infection that can cause parotitis.  Bacterial infections.  Blockage of the salivary ducts due to a salivary stone.  Narrowing of the salivary ducts.  Swelling of the salivary ducts.  Dehydration.  Autoimmune conditions, such as sarcoidosis or Sjogren syndrome.  Air from activities such as scuba diving, glass blowing, or playing an instrument (rare).  Human immunodeficiency virus (HIV) or acquired immunodeficiency syndrome (AIDS).  Tuberculosis. SIGNS AND SYMPTOMS   The ears may appear to be pushed up and out from their normal position.  Redness (erythema) of the skin over the parotid glands.  Pain and tenderness over the parotid glands.  Swelling in the parotid gland area.  Yellowish-white fluid (pus) coming from the ducts inside the cheeks.  Dry mouth.  Bad taste in the mouth. DIAGNOSIS  Your health care provider may determine that you have parotitis based on your symptoms and a physical exam. A sample of fluid may also be taken from the parotid gland and tested to find the cause of your infection. X-rays or computed tomography (CT) scans may be taken if your health care provider thinks you might have a salivary stone blocking your salivary duct. TREATMENT  Treatment varies depending upon the cause of your parotitis. If your parotitis is caused by mumps, no treatment is needed. The condition will go away on its own after 7 to 10 days. In other cases, treatment may include:  Antibiotic medicine if your infection was caused by bacteria.  Pain medicines.  Gland massage.  Eating sour candy to increase your saliva production.  Removal of salivary stones. Your health care provider may flush stones out with fluids or remove them with tweezers.  Surgery to remove the parotid glands. HOME CARE INSTRUCTIONS   If you were prescribed an antibiotic  medicine, finish it all even if you start to feel better.  Put warm compresses on the sore area.  Take medicines only as directed by your health care provider.  Drink enough fluids to keep your urine clear or pale yellow. SEEK IMMEDIATE MEDICAL CARE IF:   You have increasing pain or swelling that is not controlled with medicine.  You have a fever. MAKE SURE YOU:  Understand these instructions.  Will watch your condition.  Will get help right away if you are not doing well or get worse. Document Released: 07/14/2001 Document Revised: 06/08/2013 Document Reviewed: 12/18/2010 [facility] Patient Information 2015 [place], [place]. This information is not intended to replace advice given to you by your health care provider. Make sure you discuss any questions you have with your health care provider.

## 2014-02-05 NOTE — Progress Notes (Signed)
Subjective:  Patient was seen and examined this morning. She states she is doing much better- pain is resolved, swelling improving, no trouble swallowing, breathing, no shortness of breath, fever or chills.   Objective: Vital signs in last 24 hours: Filed Vitals:   02/04/14 1743 02/04/14 2045 02/05/14 0535 02/05/14 1013  BP: 164/108 134/96 116/64 169/94  Pulse: 73 60 58 61  Temp: 98 F (36.7 C) 97.9 F (36.6 C) 98.5 F (36.9 C) 97.9 F (36.6 C)  TempSrc: Axillary Oral Oral Oral  Resp: Height:      Weight:      SpO2: 100% 99% 98% 97%   General: Vital signs reviewed.  Patient is well-developed and well-nourished, in no acute distress and cooperative with exam.  HEENT: Normocephalic and atraumatic. EOMI, conjunctivae normal. Supple, obese, trachea midline, normal ROM, no stridor. Edematous parotid gland, improved. Able to open mouth widely and stick tongue out. Posterior oropharynx is pink and moist.  Cardiovascular: RRR, S1 normal, S2 normal, no murmurs, gallops, or rubs. Pulmonary/Chest: Clear to auscultation bilaterally, no wheezes, rales, or rhonchi. Abdominal: Soft, non-tender, non-distended, BS + Extremities: No lower extremity edema bilaterally Neurological: A&O x3 Psychiatric: Normal mood and affect. speech and behavior is normal. Cognition and memory are normal.   Lab Results: Basic Metabolic Panel:  Recent Labs Lab 02/02/14 0601 02/03/14 0253  NA 133* 136  K 3.5 4.1  CL 99 106  CO2 25 23  GLUCOSE 139* 145*  BUN 8 8  CREATININE 0.71 0.51  CALCIUM 8.8 8.7   CBC:  Recent Labs Lab 02/02/14 0601 02/03/14 0253  WBC 12.3* 14.2*  HGB 12.9 12.3  HCT 38.6 37.3  MCV 88.9 89.4  PLT 269 309   Thyroid Function Tests:  Recent Labs Lab 02/02/14 1530  TSH 1.345   Micro Results: Recent Results (from the past 240 hour(s))  MRSA PCR Screening     Status: None   Collection Time: 02/02/14 11:59 AM  Result Value Ref Range Status   MRSA by PCR  NEGATIVE NEGATIVE Final    Comment:        The GeneXpert MRSA Assay (FDA approved for NASAL specimens only), is one component of a comprehensive MRSA colonization surveillance program. It is not intended to diagnose MRSA infection nor to guide or monitor treatment for MRSA infections.    Studies/Results: No results found. Medications:  I have reviewed the patient's current medications. Prior to Admission:  Prescriptions prior to admission  Medication Sig Dispense Refill Last Dose  . clindamycin (CLEOCIN) 300 MG capsule Take 300 mg by mouth 3 (three) times daily.   02/01/2014 at Unknown time  . HYDROcodone-acetaminophen (NORCO/VICODIN) 5-325 MG per tablet Take 1-2 tablets by mouth every 6 (six) hours as needed for moderate pain or severe pain. 20 tablet 0 02/02/2014 at Unknown time  . sodium chloride (OCEAN) 0.65 % SOLN nasal spray Place 1 spray into both nostrils as needed for congestion. 15 mL 0 Past Month at Unknown time  . amoxicillin-clavulanate (AUGMENTIN) 875-125 MG per tablet Take 1 tablet by mouth 2 (two) times daily. (Patient not taking: Reported on 02/02/2014) 14 tablet 0   . dextromethorphan-guaiFENesin (TUSSIN DM) 10-100 MG/5ML liquid Take 10 mLs by mouth every 4 (four) hours as needed for cough. (Patient not taking: Reported on 02/02/2014) 118 mL 0   . doxycycline (VIBRAMYCIN) 100 MG capsule Take 1 capsule (100 mg total) by mouth 2 (two) times daily. (Patient not taking: Reported on 02/02/2014)  20 capsule 0   . erythromycin ophthalmic ointment Place a 1/2 inch ribbon of ointment into the lower eyelid 4 times a day for 5 days. (Patient not taking: Reported on 02/02/2014) 1 g g   . fluticasone (FLONASE) 50 MCG/ACT nasal spray Place 1 spray into both nostrils 2 (two) times daily. (Patient not taking: Reported on 02/02/2014) 1 g 2   . oxymetazoline (AFRIN NASAL SPRAY) 0.05 % nasal spray Place 1 spray into both nostrils 2 (two) times daily. Use for only 3 days. (Patient not  taking: Reported on 02/02/2014) 30 mL 0    Scheduled Meds: . amoxicillin-clavulanate  1 tablet Oral Q12H  . dexamethasone  4 mg Oral Once  . heparin subcutaneous  5,000 Units Subcutaneous 3 times per day  . potassium chloride  40 mEq Oral Once   Continuous Infusions:   PRN Meds:. Assessment/Plan: Principal Problem:   Acute parotitis Active Problems:   Morbid obesity   Pre-diabetes  L Acute Parotitis with Cellulitis: Patient is greatly improved today. Edema has improved and pain has resolved. She denies any shortness of breath, difficulty breathing or difficulty swallowing.  -Augmentin 875-125 mg BID for a total abx therapy of 10-14 days (start: 01/31/14; end: 02/13/14) -Airway monitoring -Sialagogues (lemon drops) -Warm compresses to area  -ENT following, appreciate recommendations -Dexamethasone po taper (4 mg BID for 2 days, 2 mg BID for 2 days, 1 mg BID for two days)  HTN: BP 116/64 this morning. Patient is not on any antihypertensives at home. Patient will follow up with PCP to have this evaluated. Patient has a new patient appointment with [facility] to establish care. Since BP has been elevated during most of her stay (140-160s systolic) we will start her on Lisinopril 5 mg daily with follow up with Dr. Stephens November. BMET should be assessed at this time. Patient states she is not sexually active and does not plan on having any more children.  -Continue to monitor  Pre-diabetes: Hgb A1c 5.8. Patient will have follow up with PCP. Patient has a new patient appointment with [facility] to establish care.  -Counseled on diet and exercise  -Monitor  Diet: Regular  DVT/PE PPx: Heparin TID  Dispo: Disposition is deferred at this time, awaiting improvement of current medical problems.  Anticipated discharge today.  The patient does not have a current PCP (No Pcp Per Patient) and does need an [facility] hospital follow-up appointment after discharge.  The patient  does not have transportation limitations that hinder transportation to clinic appointments.  .Services Needed at time of discharge: Y = Yes, Blank = No PT:   OT:   RN:   Equipment:   Other:     LOS: 3 days   Jill Alexanders, DO PGY-1 Internal Medicine Resident Pager # [phone] 02/05/2014 11:13 AM

## 2014-02-05 NOTE — Progress Notes (Signed)
Pt discharge instructions given, pt verbalized understanding.  VSS. Denies pain. Pleasant.  Pt left floor ambulating accompanied by family.

## 2014-02-05 NOTE — Progress Notes (Signed)
Patient was discharged today. Prescriptions called to [facility] on [address], but this pharmacy is closed today. MD notified and prescriptions to be called into [facility] on [address].   Leanna Battles, RN.

## 2014-02-05 NOTE — Discharge Summary (Signed)
Name: Darlene Rodriguez MRN: [id number] DOB: 09-Jun-1980 34 y.o. PCP: No Pcp Per Patient  Date of Admission: 02/02/2014  5:58 AM Date of Discharge: 02/05/2014 Attending Physician: No att. providers found  Discharge Diagnosis:  Principal Problem:   Acute parotitis Active Problems:   Morbid obesity   Pre-diabetes  Discharge Medications:   Medication List    STOP taking these medications        clindamycin 300 MG capsule  Commonly known as:  CLEOCIN     doxycycline 100 MG capsule  Commonly known as:  VIBRAMYCIN     fluticasone 50 MCG/ACT nasal spray  Commonly known as:  FLONASE     HYDROcodone-acetaminophen 5-325 MG per tablet  Commonly known as:  NORCO/VICODIN     oxymetazoline 0.05 % nasal spray  Commonly known as:  AFRIN NASAL SPRAY      TAKE these medications        amoxicillin-clavulanate 875-125 MG per tablet  Commonly known as:  AUGMENTIN  Take 1 tablet by mouth every 12 (twelve) hours.     dexamethasone 1 MG tablet  Commonly known as:  DECADRON  Take 1 tablet (1 mg total) by mouth 2 (two) times daily with a meal.     dextromethorphan-guaiFENesin 10-100 MG/5ML liquid  Commonly known as:  TUSSIN DM  Take 10 mLs by mouth every 4 (four) hours as needed for cough.     erythromycin ophthalmic ointment  Place a 1/2 inch ribbon of ointment into the lower eyelid 4 times a day for 5 days.     lisinopril 5 MG tablet  Commonly known as:  PRINIVIL,ZESTRIL  Take 1 tablet (5 mg total) by mouth daily.     sodium chloride 0.65 % Soln nasal spray  Commonly known as:  OCEAN  Place 1 spray into both nostrils as needed for congestion.        Disposition and follow-up:   Ms.Darlene Rodriguez was discharged from [facility] in Good condition.  At the hospital follow up visit please address:  1.  L Acute Parotitis with Cellulitis: Please assess resolution of symptoms, completion of Augmentin 875-125 mg BID (start: 01/31/14; end: 02/13/14), use of  Sialagogues (lemon drops) and warm compresses to area and completion of dexamethasone po taper (4 mg BID for 2 days, 2 mg BID for 2 days, 1 mg BID for two days).   HTN: Patient had mildly elevated blood pressures during admission. Patient was prescribed Lisinopril 5 mg daily on discharge with follow up with PCP Col[facility]. BMET should be assessed at follow up visit.   Pre-diabetes: Patient has newly diagnosed pre-diabetes with a Hgb A1c of 5.8. Please consider discussing her pre-diabetes at hospital follow up.  2.  Labs / imaging needed at time of follow-up: BMET since recently starting lisinopril  3.  Pending labs/ test needing follow-up: None  Follow-up Appointments: Follow-up Information    Follow up with Osborn Coho, MD On 02/08/2014.   Specialty:  Otolaryngology   Why:  10:00 am   Contact information:   [address] [place] [address] [phone]       Follow up with Jeanine Luz, FNP On 02/09/2014.   Specialty:  Family Medicine   Why:  1:00 pm Please bring $80 for your visit   Contact information:   [address] [place] [address] [phone]       Discharge Instructions: Discharge Instructions    Diet - low sodium heart healthy  Complete by:  As directed      Increase activity slowly    Complete by:  As directed            Consultations:  ENT  Procedures Performed:  Dg Chest 2 View (if Patient Has Fever And/or Copd)  02/02/2014   CLINICAL DATA:  Shortness of breath.  Nasal congestion.  EXAM: CHEST  2 VIEW  COMPARISON:  11/04/2006.  FINDINGS: Heart size is normal. Mediastinal shadows are normal. The patient has taken a poor inspiration. Allowing for that, the lungs are clear. No effusions. No significant bony finding.  IMPRESSION: Poor inspiration.  No active disease suspected.   Electronically Signed   By: Paulina Fusi M.D.   On: 02/02/2014 07:04   Ct Soft Tissue Neck W Contrast  02/02/2014   CLINICAL DATA:  Parotiditis diagnosed 2  days ago on the left. Worsening pain and swelling.  EXAM: CT NECK WITH CONTRAST  TECHNIQUE: Multidetector CT imaging of the neck was performed using the standard protocol following the bolus administration of intravenous contrast.  CONTRAST:  90mL OMNIPAQUE IOHEXOL 300 MG/ML  SOLN  COMPARISON:  01/31/2014  FINDINGS: Pharynx and larynx: No mucosal or submucosal lesion.  Salivary glands: Again demonstrated is diffuse swelling and hyperemia of the left parotid gland consistent with acute parotiditis. There is edema of the surrounding soft tissues, tracking inferiorly. Regional edema is overall worsened, now with edema seen in the retropharyngeal/prevertebral space. There is no evidence of drainable abscess. Mild regional reactive nodal prominence is demonstrated, as expected. The right parotid gland is within normal limits. Both submandibular glands are normal. There is no evidence of sellar area duct stone.  Thyroid: Symmetric and normal.  Lymph nodes: Mild reactive regional nodal prominence without evidence of worrisome focal enlargement or low density.  Vascular: Arterial and venous structures patent.  Limited intracranial: No abnormality seen.  Mastoids and visualized paranasal sinuses: Clear  Skeleton: No significant finding  Upper chest: Normal  IMPRESSION: Left parotiditis with regional inflammatory edema. The regional edema has worsened, including now development of edema in the retropharyngeal/prevertebral space. There does not appear to be a dominant or apparently drainable collection in any location however. This does suggest that the process may not be responding to the current therapy.   Electronically Signed   By: Paulina Fusi M.D.   On: 02/02/2014 08:23   Ct Maxillofacial W/cm  01/31/2014   CLINICAL DATA:  Left facial swelling for 3 days  EXAM: CT MAXILLOFACIAL WITH CONTRAST  TECHNIQUE: Multidetector CT imaging of the maxillofacial structures was performed with intravenous contrast. Multiplanar CT  image reconstructions were also generated. A small metallic BB was placed on the right temple in order to reliably differentiate right from left.  CONTRAST:  75mL OMNIPAQUE IOHEXOL 300 MG/ML  SOLN  COMPARISON:  None.  FINDINGS: Skull base and its contents are within normal limits. The orbits and their contents are unremarkable.  The left parotid gland is enlarged and hyperemic with surrounding info subcutaneous inflammatory changes. These extend inferiorly along the lateral left neck with some mild subcutaneous fluid identified. No focal abscess is seen. These changes are consistent with parotitis with extension. This extends to the level of the hyoid bone on the left. The vascular structures is visualized are within normal limits. No acute bony abnormality is seen. No significant lymphadenopathy is noted.  IMPRESSION: Changes consistent with parotitis with localized inflammatory extension inferiorly. No abscess is seen.   Electronically Signed   By: Alcide Clever  M.D.   On: 01/31/2014 17:11   Admission HPI: Ms. Whitner is a 34 year old female with PMH of HTN who presents to the emergency room again today for worsening of her left sided facial swelling and now feeling like her throat is closing. She says she first noticed left sided facial swelling and pain Thursday which then continued to get worse until she came to the emergency room on Sunday,12/27. She was discharged early morning on 12/28 from the ED with diagnosis of L parotitis and sent home with Augmentin with recommended ENT follow up. She did see Dr. Annalee Genta from ENT 12/28 who noted worsening of swelling and changed antibiotics to Clindamycin. She has taken 2 doses of the Clindamycin but feels like the swelling is worsening and now has sensation of her throat closing up. She is able to breath without difficulty at this time but says when she takes a deep breath she feels tightness in her throat. Repeat imaging today in the ED with CT soft tissue  neck with contrast showed L parotiditis with regional inflammatory edema that has worsened and extended to retropharyngeal/preverteberal space.   ED spoke with ENT, Dr. Annalee Genta who will be by to see the patient. In the meantime, she has been started on Vancomycin and Zosyn and IV Decadron.   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 She does not recall her immunization history. Last time she ate or drank was last night slowly sipping on chicken broth that she was able to swallow but has pain with swallowing. She denies having any known recent tooth infection, but does admit to likely having cavities and has not been to a dentist in quite sometime.   Hospital Course by problem list: Principal Problem:   Acute parotitis Active Problems:   Morbid obesity   Pre-diabetes   L Acute Parotitis with Cellulitis: Patient presented with left sided facial edema, tenderness, increased warmth, erythema, dysphagia and the sensation that her throat was closing up. Patient had been seen for parotitis two days prior and then saw Dr. Annalee Genta, ENT, as outpatient. Patient failed Augmentin and Ciprofloxacin and returned to the ED with worsening symptoms. CT showed extension of edema into the retropharyngeal space. Patient improved greatly on IV Vancomycin and Zosyn and was de-escalated to Augmentin. She was also treated with IV steroids, warm compresses, and lemon drops. Patient's respiratory status was never compromised and she never required additional oxygen. By day 4 of hospital stay, patient's edema had greatly improved and her pain and dysphagia had resolved. Patient was discharged on Augmentin 875-125 mg BID for a total abx therapy of 10-14 days (start: 01/31/14; end: 02/13/14). She was encouraged to continue using Sialagogues (lemon drops) and warm compresses to area. Patient was discharged on dexamethasone po taper (4 mg BID for 2 days, 2 mg BID for 2 days, 1 mg BID for two days). She will follow up  with Dr. Annalee Genta, ENT.   HTN: Patient had mildly elevated blood pressures during admission. Patient is not on any antihypertensives at home. Patient will follow up with PCP to have this evaluated. She has a new patient appointment with [facility] to establish care. Patient was prescribed Lisinopril 5 mg daily on discharge with follow up with Dr. Stephens November. BMET should be assessed at follow up visit. Patient states she is not sexually active and does not plan on having any more children.   Pre-diabetes: Patient has newly diagnosed pre-diabetes with a Hgb A1c of 5.8. Patient  will have follow up with PCP. Patient has a new patient appointment with [facility] to establish care. I counseled her on diet and exercise and ways to prevent progression to diabetes. She seems motivated to make changes. Please discuss her pre-diabetes at hospital follow up. I provided the patient with diet recommendations on discharge.   Discharge Vitals:   BP 169/94 mmHg  Pulse 61  Temp(Src) 97.9 F (36.6 C) (Oral)  Resp 15  Ht  (1.626 m)  Wt 136.079 kg (300 lb)  BMI 51.47 kg/m2  SpO2 97%  LMP 01/17/2014  Discharge Labs:  No results found for this or any previous visit (from the past 24 hour(s)).  Signed: Jill Alexanders, DO PGY-1 Internal Medicine Resident Pager # [phone] 02/05/2014 2:06 PM

## 2014-02-09 ENCOUNTER — Encounter: Payer: Self-pay | Admitting: Family

## 2014-02-09 ENCOUNTER — Ambulatory Visit (INDEPENDENT_AMBULATORY_CARE_PROVIDER_SITE_OTHER): Payer: BLUE CROSS/BLUE SHIELD | Admitting: Family

## 2014-02-09 VITALS — BP 128/88 | HR 85 | Temp 97.5°F | Resp 18 | Ht 64.0 in | Wt 320.8 lb

## 2014-02-09 DIAGNOSIS — K1121 Acute sialoadenitis: Secondary | ICD-10-CM

## 2014-02-09 DIAGNOSIS — R7309 Other abnormal glucose: Secondary | ICD-10-CM

## 2014-02-09 DIAGNOSIS — I10 Essential (primary) hypertension: Secondary | ICD-10-CM | POA: Insufficient documentation

## 2014-02-09 DIAGNOSIS — R7303 Prediabetes: Secondary | ICD-10-CM

## 2014-02-09 NOTE — Progress Notes (Signed)
Subjective:    Patient ID: Darlene Rodriguez, female    DOB: 01/14/1981, 34 y.o.   MRN: [id number]  Chief Complaint  Patient presents with  . Establish Care    was told to get a PCP   HPI:  Darlene Rodriguez is a 34 y.o. female who presents today to establish care and discuss her high blood pressure and pre-diabetes.   1) Hypertension - first experienced rounds of hypertension with pregnancy induced hypertension. During recent hospitalization was noted to have high blood pressure readings and was started on lisinopril. Denies any coughs or other adverse effects of the medication.  BP Readings from Last 3 Encounters:  02/09/14 128/88  02/05/14 169/94  01/31/14 155/85    2) Parotitis - indicates she noted edema on the left side of her cheek and the feeling was described as swelling and her airway felt constricted. Continues to be treated with Augmentin and followed by ENT.   3) Pre-diabetes - during hospitalization her A1c was checked and revealed a 5.8. He was instructed to follow up with primary care regarding prediabetes.  Allergies  Allergen Reactions  . Diphenhydramine Shortness Of Breath    Current Outpatient Prescriptions on File Prior to Visit  Medication Sig Dispense Refill  . amoxicillin-clavulanate (AUGMENTIN) 875-125 MG per tablet Take 1 tablet by mouth every 12 (twelve) hours. 17 tablet 0  . dexamethasone (DECADRON) 1 MG tablet Take 1 tablet (1 mg total) by mouth 2 (two) times daily with a meal. 24 tablet 0  . lisinopril (PRINIVIL,ZESTRIL) 5 MG tablet Take 1 tablet (5 mg total) by mouth daily. 30 tablet 11  . sodium chloride (OCEAN) 0.65 % SOLN nasal spray Place 1 spray into both nostrils as needed for congestion. 15 mL 0   No current facility-administered medications on file prior to visit.    Past Medical History  Diagnosis Date  . Hypertension     Family History  Problem Relation Age of Onset  . Lung cancer Father   . Heart disease Maternal Grandfather      History   Social History  . Marital Status: Single    Spouse Name: N/A    Number of Children: 1  . 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 Smoking status: Never Smoker   . Smokeless tobacco: Never Used  . Alcohol Use: No  . Drug Use: No  . Sexual Activity: Not on file   Other Topics Concern  . Not on file   Social History Narrative   Born [place] raised in [place], [place]. Currently resides in a house with her daughter. No pets. Fun: Shopping, swimming, bowling, anything outdoors.   Denies religious beliefs that would effect health care.     Review of Systems  Respiratory: Negative for chest tightness.   Cardiovascular: Negative for chest pain, palpitations and leg swelling.      Objective:    BP 128/88 mmHg  Pulse 85  Temp(Src) 97.5 F (36.4 C) (Oral)  Resp 18  Ht 5\' 4"  (1.626 m)  Wt 320 lb 12.8 oz (145.514 kg)  BMI 55.04 kg/m2  SpO2 97%  LMP 01/17/2014 Nursing note and vital signs reviewed.  Physical Exam  Constitutional: She is oriented to person, place, and time. She appears well-developed and well-nourished. No distress.  Morbidly obese female seated in the chair, dressed appropriately for this situation, appears older than her stated age.  Cardiovascular: Normal rate,  regular rhythm, normal heart sounds and intact distal pulses.   Pulmonary/Chest: Effort normal and breath sounds normal.  Neurological: She is alert and oriented to person, place, and time.  Skin: Skin is warm and dry.  Psychiatric: She has a normal mood and affect. Her behavior is normal. Judgment and thought content normal.       Assessment & Plan:

## 2014-02-09 NOTE — Progress Notes (Signed)
Pre visit review using our clinic review tool, if applicable. No additional management support is needed unless otherwise documented below in the visit note. 

## 2014-02-09 NOTE — Patient Instructions (Signed)
Thank you for choosing [facility].  Summary/Instructions:  Schedule a time for your physical at your convenience.   Keep taking medications as prescribed.  Keep a food journal for review.    Hypertension Hypertension, commonly called high blood pressure, is when the force of blood pumping through your arteries is too strong. Your arteries are the blood vessels that carry blood from your heart throughout your body. A blood pressure reading consists of a higher number over a lower number, such as 110/72. The higher number (systolic) is the pressure inside your arteries when your heart pumps. The lower number (diastolic) is the pressure inside your arteries when your heart relaxes. Ideally you want your blood pressure below 120/80. Hypertension forces your heart to work harder to pump blood. Your arteries may become narrow or stiff. Having hypertension puts you at risk for heart disease, stroke, and other problems.  RISK FACTORS Some risk factors for high blood pressure are controllable. Others are not.  Risk factors you cannot control include:   Race. You may be at higher risk if you are African American.  Age. Risk increases with age.  Gender. Men are at higher risk than women before age 35 years. After age 35, women are at higher risk than men. Risk factors you can control include:  Not getting enough exercise or physical activity.  Being overweight.  Getting too much fat, sugar, calories, or salt in your diet.  Drinking too much alcohol. SIGNS AND SYMPTOMS Hypertension does not usually cause signs or symptoms. Extremely high blood pressure (hypertensive crisis) may cause headache, anxiety, shortness of breath, and nosebleed. DIAGNOSIS  To check if you have hypertension, your health care provider will measure your blood pressure while you are seated, with your arm held at the level of your heart. It should be measured at least twice using the same arm. Certain conditions  can cause a difference in blood pressure between your right and left arms. A blood pressure reading that is higher than normal on one occasion does not mean that you need treatment. If one blood pressure reading is high, ask your health care provider about having it checked again. TREATMENT  Treating high blood pressure includes making lifestyle changes and possibly taking medicine. Living a healthy lifestyle can help lower high blood pressure. You may need to change some of your habits. Lifestyle changes may include:  Following the DASH diet. This diet is high in fruits, vegetables, and whole grains. It is low in salt, red meat, and added sugars.  Getting at least 2 hours of brisk physical activity every week.  Losing weight if necessary.  Not smoking.  Limiting alcoholic beverages.  Learning ways to reduce stress. If lifestyle changes are not enough to get your blood pressure under control, your health care provider may prescribe medicine. You may need to take more than one. Work closely with your health care provider to understand the risks and benefits. HOME CARE INSTRUCTIONS  Have your blood pressure rechecked as directed by your health care provider.   Take medicines only as directed by your health care provider. Follow the directions carefully. Blood pressure medicines must be taken as prescribed. The medicine does not work as well when you skip doses. Skipping doses also puts you at risk for problems.   Do not smoke.   Monitor your blood pressure at home as directed by your health care provider. SEEK MEDICAL CARE IF:   You think you are having a reaction to medicines taken.  You have recurrent headaches or feel dizzy.  You have swelling in your ankles.  You have trouble with your vision. SEEK IMMEDIATE MEDICAL CARE IF:  You develop a severe headache or confusion.  You have unusual weakness, numbness, or feel faint.  You have severe chest or abdominal  pain.  You vomit repeatedly.  You have trouble breathing. MAKE SURE YOU:   Understand these instructions.  Will watch your condition.  Will get help right away if you are not doing well or get worse. Document Released: 01/22/2005 Document Revised: 06/08/2013 Document Reviewed: 11/14/2012 [facility] Patient Information 2015 [place], [place]. This information is not intended to replace advice given to you by your health care provider. Make sure you discuss any questions you have with your health care provider.

## 2014-02-09 NOTE — Assessment & Plan Note (Signed)
Blood pressure remains stable on current regimen. Continue lisinopril 5 mg daily. Discussed with patient risk factors associated with hypertension and preventative measures needed to prevent future endorgan damage.

## 2014-02-09 NOTE — Assessment & Plan Note (Signed)
Discussed meanings of prediabetes with patient. Indicates she will adjust her lifestyle behaviors and increase fruits and vegetables and decrease saturated fats will slowly improving physical activity as tolerated. Will follow-up in a proximally 6 months to determine status.

## 2014-02-09 NOTE — Assessment & Plan Note (Signed)
Currently stable on amoxicillin-clavulanate. Continue current dosage of medication and management per ENT.

## 2015-03-28 ENCOUNTER — Encounter (HOSPITAL_COMMUNITY): Payer: Self-pay | Admitting: Family Medicine

## 2015-03-28 ENCOUNTER — Emergency Department (HOSPITAL_COMMUNITY)
Admission: EM | Admit: 2015-03-28 | Discharge: 2015-03-28 | Disposition: A | Discharge status: Home / Self Care | Payer: Self-pay | Attending: Emergency Medicine | Admitting: Emergency Medicine

## 2015-03-28 DIAGNOSIS — R112 Nausea with vomiting, unspecified: Secondary | ICD-10-CM | POA: Insufficient documentation

## 2015-03-28 DIAGNOSIS — Z79899 Other long term (current) drug therapy: Secondary | ICD-10-CM | POA: Insufficient documentation

## 2015-03-28 DIAGNOSIS — E669 Obesity, unspecified: Secondary | ICD-10-CM | POA: Insufficient documentation

## 2015-03-28 DIAGNOSIS — M791 Myalgia: Secondary | ICD-10-CM | POA: Insufficient documentation

## 2015-03-28 DIAGNOSIS — Z792 Long term (current) use of antibiotics: Secondary | ICD-10-CM | POA: Insufficient documentation

## 2015-03-28 DIAGNOSIS — R509 Fever, unspecified: Secondary | ICD-10-CM | POA: Insufficient documentation

## 2015-03-28 DIAGNOSIS — Z3202 Encounter for pregnancy test, result negative: Secondary | ICD-10-CM | POA: Insufficient documentation

## 2015-03-28 DIAGNOSIS — I10 Essential (primary) hypertension: Secondary | ICD-10-CM | POA: Insufficient documentation

## 2015-03-28 DIAGNOSIS — Z7952 Long term (current) use of systemic steroids: Secondary | ICD-10-CM | POA: Insufficient documentation

## 2015-03-28 LAB — URINALYSIS, ROUTINE W REFLEX MICROSCOPIC
Bilirubin Urine: NEGATIVE
GLUCOSE, UA: NEGATIVE mg/dL
Hgb urine dipstick: NEGATIVE
KETONES UR: NEGATIVE mg/dL
LEUKOCYTES UA: NEGATIVE
Nitrite: NEGATIVE
PROTEIN: NEGATIVE mg/dL
Specific Gravity, Urine: 1.024 (ref 1.005–1.030)
pH: 6 (ref 5.0–8.0)

## 2015-03-28 LAB — COMPREHENSIVE METABOLIC PANEL
ALT: 19 U/L (ref 14–54)
AST: 15 U/L (ref 15–41)
Albumin: 3.7 g/dL (ref 3.5–5.0)
Alkaline Phosphatase: 88 U/L (ref 38–126)
Anion gap: 11 (ref 5–15)
BUN: 7 mg/dL (ref 6–20)
CALCIUM: 8.9 mg/dL (ref 8.9–10.3)
CO2: 24 mmol/L (ref 22–32)
CREATININE: 0.55 mg/dL (ref 0.44–1.00)
Chloride: 105 mmol/L (ref 101–111)
GFR calc Af Amer: >60 mL/min (ref >60)
Glucose, Bld: 100 mg/dL — ABNORMAL HIGH (ref 65–99)
Potassium: 3.9 mmol/L (ref 3.5–5.1)
Sodium: 140 mmol/L (ref 135–145)
Total Bilirubin: 0.4 mg/dL (ref 0.3–1.2)
Total Protein: 6.8 g/dL (ref 6.5–8.1)

## 2015-03-28 LAB — CBC
HCT: 39.7 % (ref 36.0–46.0)
Hemoglobin: 13.1 g/dL (ref 12.0–15.0)
MCH: 29.6 pg (ref 26.0–34.0)
MCHC: 33 g/dL (ref 30.0–36.0)
MCV: 89.6 fL (ref 78.0–100.0)
PLATELETS: 300 10*3/uL (ref 150–400)
RBC: 4.43 MIL/uL (ref 3.87–5.11)
RDW: 13 % (ref 11.5–15.5)
WBC: 8.4 10*3/uL (ref 4.0–10.5)

## 2015-03-28 LAB — I-STAT BETA HCG BLOOD, ED (MC, WL, AP ONLY)

## 2015-03-28 LAB — LIPASE, BLOOD: LIPASE: 19 U/L (ref 11–51)

## 2015-03-28 MED ORDER — ONDANSETRON 4 MG PO TBDP
ORAL_TABLET | ORAL | Status: AC
  Filled 2015-03-28: qty 1

## 2015-03-28 MED ORDER — KETOROLAC TROMETHAMINE 30 MG/ML IJ SOLN
30.0000 mg | Freq: Once | INTRAMUSCULAR | Status: AC
  Administered 2015-03-28: 30 mg via INTRAVENOUS
  Filled 2015-03-28: qty 1

## 2015-03-28 MED ORDER — ONDANSETRON HCL 4 MG PO TABS: 4.0000 mg | ORAL_TABLET | Freq: Four times a day (QID) | ORAL | Status: DC

## 2015-03-28 MED ORDER — SODIUM CHLORIDE 0.9 % IV BOLUS (SEPSIS)
1000.0000 mL | Freq: Once | INTRAVENOUS | Status: AC
  Administered 2015-03-28: 1000 mL via INTRAVENOUS

## 2015-03-28 MED ORDER — METOCLOPRAMIDE HCL 5 MG/ML IJ SOLN
10.0000 mg | Freq: Once | INTRAMUSCULAR | Status: AC
  Administered 2015-03-28: 10 mg via INTRAVENOUS
  Filled 2015-03-28: qty 2

## 2015-03-28 MED ORDER — ONDANSETRON 4 MG PO TBDP
4.0000 mg | ORAL_TABLET | Freq: Once | ORAL | Status: AC | PRN
  Administered 2015-03-28: 4 mg via ORAL

## 2015-03-28 NOTE — Discharge Instructions (Signed)
It is important to stay well-hydrated at home and drink plenty of water and/or Gatorade. Take your nausea medicine as prescribed. Follow-up with your doctor as needed. Return to ED for new or worsening symptoms such as fever, abdominal pain or worsening vomiting.  Nausea and Vomiting Nausea means you feel sick to your stomach. Throwing up (vomiting) is a reflex where stomach contents come out of your mouth. HOME CARE   Take medicine as told by your doctor.  Do not force yourself to eat. However, you do need to drink fluids.  If you feel like eating, eat a normal diet as told by your doctor.  Eat rice, wheat, potatoes, bread, lean meats, yogurt, fruits, and vegetables.  Avoid high-fat foods.  Drink enough fluids to keep your pee (urine) clear or pale yellow.  Ask your doctor how to replace body fluid losses (rehydrate). Signs of body fluid loss (dehydration) include:  Feeling very thirsty.  Dry lips and mouth.  Feeling dizzy.  Dark pee.  Peeing less than normal.  Feeling confused.  Fast breathing or heart rate. GET HELP RIGHT AWAY IF:   You have blood in your throw up.  You have black or bloody poop (stool).  You have a bad headache or stiff neck.  You feel confused.  You have bad belly (abdominal) pain.  You have chest pain or trouble breathing.  You do not pee at least once every 8 hours.  You have cold, clammy skin.  You keep throwing up after 24 to 48 hours.  You have a fever. MAKE SURE YOU:   Understand these instructions.  Will watch your condition.  Will get help right away if you are not doing well or get worse.   This information is not intended to replace advice given to you by your health care provider. Make sure you discuss any questions you have with your health care provider.   Document Released: 07/11/2007 Document Revised: 04/16/2011 Document Reviewed: 06/23/2010 Elsevier Interactive Patient Education [employer]

## 2015-03-28 NOTE — ED Provider Notes (Signed)
CSN: [id number]     Arrival date & time 03/28/15  1004 History   First MD Initiated Contact with Patient 03/28/15 1231     Chief Complaint  Patient presents with  . Nausea  . Generalized Body Aches     (Consider location/radiation/quality/duration/timing/severity/associated sxs/prior Treatment) HPI Darlene Rodriguez is a 35 y.o. female with history of hypertension comes in for evaluation of nausea, vomiting and generalized body aches. Patient reports last night she began to have nausea with vomiting 2, nonbloody and nonbilious. She reports a fever this morning of 102F, that resolved with ibuprofen. 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 She denies any other abdominal pain, urinary symptoms, diarrhea or constipation, rash. Nothing makes the problem better or worse. No other medical complaints.  Past Medical History  Diagnosis Date  . Hypertension    Past Surgical History  Procedure Laterality Date  . Cesarean section    . Tonsillectomy     Family History  Problem Relation Age of Onset  . Lung cancer Father   . Heart disease Maternal Grandfather    Social History  Substance Use Topics  . Smoking status: Never Smoker   . Smokeless tobacco: Never Used  . Alcohol Use: No   OB History    No data available     Review of Systems A 10 point review of systems was completed and was negative except for pertinent positives and negatives as mentioned in the history of present illness     Allergies  Diphenhydramine  Home Medications   Prior to Admission medications   Medication Sig Start Date End Date Taking? Authorizing Provider  sodium chloride (OCEAN) 0.65 % SOLN nasal spray Place 1 spray into both nostrils as needed for congestion. 12/13/13  Yes Mellody Drown, PA-C  amoxicillin-clavulanate (AUGMENTIN) 875-125 MG per tablet Take 1 tablet by mouth every 12 (twelve) hours. 02/05/14   Alexa Dulcy Fanny, MD  dexamethasone (DECADRON) 1 MG tablet Take 1  tablet (1 mg total) by mouth 2 (two) times daily with a meal. 02/05/14   Alexa Dulcy Fanny, MD  lisinopril (PRINIVIL,ZESTRIL) 5 MG tablet Take 1 tablet (5 mg total) by mouth daily. 02/05/14   Alexa Dulcy Fanny, MD  ondansetron (ZOFRAN) 4 MG tablet Take 1 tablet (4 mg total) by mouth every 6 (six) hours. 03/28/15   Joycie Peek, PA-C   BP 140/66 mmHg  Pulse 86  Temp(Src) 98.5 F (36.9 C)  Resp 16  SpO2 100%  LMP 02/23/2015 Physical Exam  Constitutional: She is oriented to person, place, and time. She appears well-developed and well-nourished.  Obese Caucasian female  HENT:  Head: Normocephalic and atraumatic.  Mouth/Throat: Oropharynx is clear and moist.  Mildly dry mucous membranes  Eyes: Conjunctivae are normal. Pupils are equal, round, and reactive to light. Right eye exhibits no discharge. Left eye exhibits no discharge. No scleral icterus.  Neck: Neck supple.  Cardiovascular: Normal rate, regular rhythm and normal heart sounds.   Pulmonary/Chest: Effort normal and breath sounds normal. No respiratory distress. She has no wheezes. She has no rales.  Abdominal: Soft. There is no tenderness.  Musculoskeletal: She exhibits no tenderness.  Neurological: She is alert and oriented to person, place, and time.  Cranial Nerves II-XII grossly intact  Skin: Skin is warm and dry. No rash noted.  Psychiatric: She has a normal mood and affect.  Nursing note and vitals reviewed.   ED Course  Procedures (including critical care time) Labs  Review Labs Reviewed  COMPREHENSIVE METABOLIC PANEL - Abnormal; Notable for the following:    Glucose, Bld 100 (*)    All other components within normal limits  LIPASE, BLOOD  CBC  URINALYSIS, ROUTINE W REFLEX MICROSCOPIC (NOT AT [facility])  I-STAT BETA HCG BLOOD, ED ([facility], [facility], [facility] ONLY)    Imaging Review No results found. I have personally reviewed and evaluated these images and lab results as part of my medical decision-making.   EKG  Interpretation None     Meds given in ED:  Medications  ondansetron (ZOFRAN-ODT) disintegrating tablet 4 mg (4 mg Oral Given 03/28/15 1036)  sodium chloride 0.9 % bolus 1,000 mL (1,000 mLs Intravenous New Bag/Given 03/28/15 1339)  ketorolac (TORADOL) 30 MG/ML injection 30 mg (30 mg Intravenous Given 03/28/15 1339)  metoCLOPramide (REGLAN) injection 10 mg (10 mg Intravenous Given 03/28/15 1339)    New Prescriptions   ONDANSETRON (ZOFRAN) 4 MG TABLET    Take 1 tablet (4 mg total) by mouth every 6 (six) hours.   Filed Vitals:   03/28/15 1034  BP: 140/66  Pulse: 86  Temp: 98.5 F (36.9 C)  Resp: 16  SpO2: 100%    MDM  Darlene Rodriguez is a 35 y.o. female presents for evaluation of nausea and vomiting 2. Also has contacts with similar symptoms. Physical exam is unremarkable. Vital signs are stable and she is afebrile. She appears very well and emergency department. Screening labs, urine and pregnancy are all negative. States she feels much better after 1 L of normal saline, Toradol and Reglan. Plan to DC with instructions for aggressive oral rehydration with balanced electrolyte solution, antinausea medicines. Follow-up with PCP, referral given [facility]. She verbalizes understanding and agrees with this plan. The patient appears reasonably screened and/or stabilized for discharge and I doubt any other medical condition or other [facility] requiring further screening, evaluation, or treatment in the ED at this time prior to discharge.   Final diagnoses:  Non-intractable vomiting with nausea, vomiting of unspecified type        Joycie Peek, PA-C 03/28/15 1425  Laurence Spates, MD 03/30/15 [phone]

## 2015-03-28 NOTE — ED Notes (Addendum)
Pt here for headache, body aches, chills and nausea that started last night. sts also vomiting.

## 2016-01-17 ENCOUNTER — Emergency Department (HOSPITAL_COMMUNITY)
Admission: EM | Admit: 2016-01-17 | Discharge: 2016-01-17 | Disposition: A | Discharge status: Home / Self Care | Payer: Self-pay | Attending: Emergency Medicine | Admitting: Emergency Medicine

## 2016-01-17 ENCOUNTER — Encounter (HOSPITAL_COMMUNITY): Payer: Self-pay

## 2016-01-17 DIAGNOSIS — I10 Essential (primary) hypertension: Secondary | ICD-10-CM | POA: Insufficient documentation

## 2016-01-17 DIAGNOSIS — B001 Herpesviral vesicular dermatitis: Secondary | ICD-10-CM | POA: Insufficient documentation

## 2016-01-17 DIAGNOSIS — Z79899 Other long term (current) drug therapy: Secondary | ICD-10-CM | POA: Insufficient documentation

## 2016-01-17 MED ORDER — ACYCLOVIR 400 MG PO TABS: 400.0000 mg | ORAL_TABLET | Freq: Every day | ORAL | 0 refills | Status: DC

## 2016-01-17 MED ORDER — ACYCLOVIR 200 MG PO CAPS
400.0000 mg | ORAL_CAPSULE | Freq: Once | ORAL | Status: AC
  Administered 2016-01-17: 400 mg via ORAL
  Filled 2016-01-17: qty 2

## 2016-01-17 NOTE — ED Triage Notes (Signed)
Pt states that her bottom lip started swelling on Saturday night, worse Sunday morning, blisters on bottom lip, pt states that she no longer take lisinopril. Denies SOB, no oral swelling involved

## 2016-01-17 NOTE — Discharge Instructions (Signed)
Take acyclovir as prescribed until all gone. Use carmex or other benzocaine and vasiline containing products for pain. Follow up with your doctor for recheck.

## 2016-01-17 NOTE — ED Provider Notes (Signed)
[facility] DE[facility] Provider Note   CSN: [id number] Arrival date & time: 01/17/16  1936     History   Chief Complaint Chief Complaint  Patient presents with  . Rash    HPI Darlene Rodriguez is a 35 y.o. female.  HPI Darlene Rodriguez is a 35 y.o. female with hx of htn, presents to ED with complaint of bilateral lip swelling and pain. Reports symptoms began 3 days ago. States started with a small blister and gradually worsened and spread over bilateral upper and lower lips. Has tried embisol and over the counter cold sore medications. States painful to touch. 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 Denies any medical problems. Does not take any medications daily. Denies any new over-the-counter or prescribed medications in the last several weeks. No new products. No new lipstick so chopsticks. No new soaps.  Past Medical History:  Diagnosis Date  . Hypertension     Patient Active Problem List   Diagnosis Date Noted  . Essential hypertension 02/09/2014  . Pre-diabetes 02/05/2014  . Acute parotitis 02/02/2014  . Morbid obesity (HCC) 02/02/2014    Past Surgical History:  Procedure Laterality Date  . CESAREAN SECTION    . TONSILLECTOMY      OB History    No data available       Home Medications    Prior to Admission medications   Medication Sig Start Date End Date Taking? Authorizing Provider  amoxicillin-clavulanate (AUGMENTIN) 875-125 MG per tablet Take 1 tablet by mouth every 12 (twelve) hours. 02/05/14   Alexa Lucrezia Starch Burns, MD  dexamethasone (DECADRON) 1 MG tablet Take 1 tablet (1 mg total) by mouth 2 (two) times daily with a meal. 02/05/14   Alexa Lucrezia Starch Burns, MD  lisinopril (PRINIVIL,ZESTRIL) 5 MG tablet Take 1 tablet (5 mg total) by mouth daily. 02/05/14   Alexa Lucrezia Starch Burns, MD  ondansetron (ZOFRAN) 4 MG tablet Take 1 tablet (4 mg total) by mouth every 6 (six) hours. 03/28/15   Joycie PeekBenjamin Cartner, PA-C  sodium chloride (OCEAN) 0.65 % SOLN nasal spray Place 1  spray into both nostrils as needed for congestion. 12/13/13   Mellody DrownLauren Parker, PA-C    Family History Family History  Problem Relation Age of Onset  . Lung cancer Father   . Heart disease Maternal Grandfather     Social History Social History  Substance Use Topics  . Smoking status: Never Smoker  . Smokeless tobacco: Never Used  . Alcohol use No     Allergies   Diphenhydramine   Review of Systems Review of Systems  Constitutional: Negative for chills, diaphoresis and fever.  HENT: Positive for facial swelling and mouth sores. Negative for nosebleeds.   Respiratory: Negative for cough and shortness of breath.   Cardiovascular: Negative for chest pain.  Gastrointestinal: Negative for abdominal pain, diarrhea, nausea and vomiting.  Skin: Positive for rash and wound.  Neurological: Negative for headaches.  All other systems reviewed and are negative.    Physical Exam Updated Vital Signs BP (!) 154/104 (BP Location: Left Arm) Comment (BP Location): BP taken in pt's forearm  Pulse 99   Temp 98.5 F (36.9 C) (Oral)   Resp 18   Ht 5\' 4"  (1.626 m)   Wt (!) 153.4 kg   SpO2 98%   BMI 58.04 kg/m   Physical Exam  Constitutional: She is oriented to person, place, and time. She appears well-developed and well-nourished. No distress.  HENT:  Head: Normocephalic.  Yellow crusted scabbing to bilateral upper and lower lips with erythematous base. Some vesicles present as well. No tongue or oral mucosal lesions  Eyes: Conjunctivae are normal.  Neck: Neck supple.  Cardiovascular: Normal rate, regular rhythm and normal heart sounds.   Pulmonary/Chest: Effort normal and breath sounds normal. No respiratory distress. She has no wheezes. She has no rales.  Abdominal: Soft. Bowel sounds are normal. She exhibits no distension. There is no tenderness. There is no rebound.  Musculoskeletal: She exhibits no edema.  Neurological: She is alert and oriented to person, place, and time.  Skin:  Skin is warm and dry.  Psychiatric: She has a normal mood and affect. Her behavior is normal.  Nursing note and vitals reviewed.    ED Treatments / Results  Labs (all labs ordered are listed, but only abnormal results are displayed) Labs Reviewed - No data to display  EKG  EKG Interpretation None       Radiology No results found.  Procedures Procedures (including critical care time)  Medications Ordered in ED Medications - No data to display   Initial Impression / Assessment and Plan / ED Course  I have reviewed the triage vital signs and the nursing notes.  Pertinent labs & imaging results that were available during my care of the patient were reviewed by me and considered in my medical decision making (see chart for details).  Clinical Course     Pt with upper and lower lip herpetic lesions. Doubt allergic in nature, no new medications, foods, products. No rash or any other symptoms anywhere in the body. Will treat with acyclovir. Follow up with pcp.    Vitals:   01/17/16 1944 01/17/16 2250  BP: (!) 154/104 150/95  Pulse: 99 85  Resp: 18 18  Temp: 98.5 F (36.9 C)      Final Clinical Impressions(s) / ED Diagnoses   Final diagnoses:  Herpes labialis    New Prescriptions Discharge Medication List as of 01/17/2016  9:43 PM    START taking these medications   Details  acyclovir (ZOVIRAX) 400 MG tablet Take 1 tablet (400 mg total) by mouth 5 (five) times daily., Starting Tue 01/17/2016, Print         Jaynie Crumbleatyana Rucha Wissinger, PA-C 01/17/16 [id number]    Jacalyn LefevreJulie Haviland, MD 01/17/16 [phone]

## 2016-01-20 ENCOUNTER — Encounter (HOSPITAL_COMMUNITY): Payer: Self-pay | Admitting: *Deleted

## 2016-01-20 ENCOUNTER — Emergency Department (HOSPITAL_COMMUNITY)
Admission: EM | Admit: 2016-01-20 | Discharge: 2016-01-20 | Disposition: A | Discharge status: Home / Self Care | Payer: Self-pay | Attending: Emergency Medicine | Admitting: Emergency Medicine

## 2016-01-20 DIAGNOSIS — I10 Essential (primary) hypertension: Secondary | ICD-10-CM | POA: Insufficient documentation

## 2016-01-20 DIAGNOSIS — L0100 Impetigo, unspecified: Secondary | ICD-10-CM | POA: Insufficient documentation

## 2016-01-20 MED ORDER — CEPHALEXIN 500 MG PO CAPS: 500.0000 mg | ORAL_CAPSULE | Freq: Four times a day (QID) | ORAL | 0 refills | Status: DC

## 2016-01-20 MED ORDER — HYDROCODONE-ACETAMINOPHEN 5-325 MG PO TABS: 1.0000 | ORAL_TABLET | Freq: Four times a day (QID) | ORAL | 0 refills | Status: DC | PRN

## 2016-01-20 NOTE — ED Triage Notes (Signed)
The pt is c/o swollen upper and lower lips since last Saturday.  She reports that she has fever blisters and her lips are no better

## 2016-01-20 NOTE — ED Provider Notes (Signed)
[facility] Provider Note   CSN: [id number] Arrival date & time: 01/20/16  1857     History   Chief Complaint Chief Complaint  Patient presents with  . Oral Swelling    HPI Darlene Rodriguez is a 35 y.o. female.  Patient presents to the ED with a chief complaint of lip swelling.  She states that she was seen here a few days ago for cold sores.  She was treated with acyclovir.  She states that she has since developed increased crusting lesions and more swelling of her lips.  She denies any fevers, chills, throat swelling, nausea, or vomiting.  She has been taking the acyclovir as prescribed.  She denies any other associated symptoms.   The history is provided by the patient. No language interpreter was used.    Past Medical History:  Diagnosis Date  . Hypertension     Patient Active Problem List   Diagnosis Date Noted  . Essential hypertension 02/09/2014  . Pre-diabetes 02/05/2014  . Acute parotitis 02/02/2014  . Morbid obesity (HCC) 02/02/2014    Past Surgical History:  Procedure Laterality Date  . CESAREAN SECTION    . TONSILLECTOMY      OB History    No data available       Home Medications    Prior to Admission medications   Medication Sig Start Date End Date Taking? Authorizing Provider  acyclovir (ZOVIRAX) 400 MG tablet Take 1 tablet (400 mg total) by mouth 5 (five) times daily. 01/17/16  Yes Jaynie Crumbleatyana Kirichenko, PA-C    Family History Family History  Problem Relation Age of Onset  . Lung cancer Father   . Heart disease Maternal Grandfather     Social History Social History  Substance Use Topics  . Smoking status: Never Smoker  . Smokeless tobacco: Never Used  . Alcohol use No     Allergies   Diphenhydramine   Review of Systems Review of Systems  All other systems reviewed and are negative.    Physical Exam Updated Vital Signs BP 157/97   Pulse 101   Temp 98.2 F (36.8 C)   Resp 20   Ht 5\' 4"  (1.626 m)   Wt (!)  153.3 kg   SpO2 100%   BMI 58.02 kg/m   Physical Exam  Constitutional: She is oriented to person, place, and time. She appears well-developed and well-nourished.  HENT:  Head: Normocephalic and atraumatic.  Honey crusted lesions on upper and lower lip No erythema on face No tongue swelling No stridor   Eyes: Conjunctivae and EOM are normal. Pupils are equal, round, and reactive to light.  Neck: Normal range of motion. Neck supple.  Cardiovascular: Normal rate and regular rhythm.  Exam reveals no gallop and no friction rub.   No murmur heard. Pulmonary/Chest: Effort normal and breath sounds normal. No respiratory distress. She has no wheezes. She has no rales. She exhibits no tenderness.  Abdominal: Soft. Bowel sounds are normal. She exhibits no distension and no mass. There is no tenderness. There is no rebound and no guarding.  Musculoskeletal: Normal range of motion. She exhibits no edema or tenderness.  Neurological: She is alert and oriented to person, place, and time.  Skin: Skin is warm and dry.  Psychiatric: She has a normal mood and affect. Her behavior is normal. Judgment and thought content normal.  Nursing note and vitals reviewed.    ED Treatments / Results  Labs (all labs ordered are listed, but only abnormal results  are displayed) Labs Reviewed - No data to display  EKG  EKG Interpretation None       Radiology No results found.  Procedures Procedures (including critical care time)  Medications Ordered in ED Medications - No data to display   Initial Impression / Assessment and Plan / ED Course  I have reviewed the triage vital signs and the nursing notes.  Pertinent labs & imaging results that were available during my care of the patient were reviewed by me and considered in my medical decision making (see chart for details).  Clinical Course     Patient with upper and lower lip swelling.  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  I have  recommended that she continue this therapy, but believe that she is having worsening symptoms due to superimposed bacterial infection.  Will treat impetigo with cephalexin.  Recommend close follow-up with PCP.  Patient seen by and discussed with Dr. Clarene DukeLittle, who agrees with the plan.  Final Clinical Impressions(s) / ED Diagnoses   Final diagnoses:  Impetigo    New Prescriptions New Prescriptions   No medications on file       Roxy HorsemanRobert Daveah Varone, PA-C 01/20/16 2322    Laurence Spatesachel Morgan Little, MD 01/21/16 [phone]

## 2016-02-08 ENCOUNTER — Encounter (HOSPITAL_COMMUNITY): Payer: Self-pay

## 2016-02-08 ENCOUNTER — Emergency Department (HOSPITAL_COMMUNITY)
Admission: EM | Admit: 2016-02-08 | Discharge: 2016-02-08 | Disposition: A | Discharge status: Home / Self Care | Payer: Self-pay | Attending: Emergency Medicine | Admitting: Emergency Medicine

## 2016-02-08 DIAGNOSIS — I10 Essential (primary) hypertension: Secondary | ICD-10-CM | POA: Insufficient documentation

## 2016-02-08 DIAGNOSIS — Z79899 Other long term (current) drug therapy: Secondary | ICD-10-CM | POA: Insufficient documentation

## 2016-02-08 DIAGNOSIS — B001 Herpesviral vesicular dermatitis: Secondary | ICD-10-CM | POA: Insufficient documentation

## 2016-02-08 LAB — BASIC METABOLIC PANEL
ANION GAP: 12 (ref 5–15)
BUN: 9 mg/dL (ref 6–20)
CALCIUM: 9 mg/dL (ref 8.9–10.3)
CHLORIDE: 102 mmol/L (ref 101–111)
CO2: 24 mmol/L (ref 22–32)
CREATININE: 0.66 mg/dL (ref 0.44–1.00)
GFR calc Af Amer: >60 mL/min (ref >60)
GFR calc non Af Amer: >60 mL/min (ref >60)
Glucose, Bld: 108 mg/dL — ABNORMAL HIGH (ref 65–99)
Potassium: 3.9 mmol/L (ref 3.5–5.1)
Sodium: 138 mmol/L (ref 135–145)

## 2016-02-08 LAB — CBC WITH DIFFERENTIAL/PLATELET
BASOS ABS: 0 10*3/uL (ref 0.0–0.1)
Basophils Relative: 0 %
EOS ABS: 0.1 10*3/uL (ref 0.0–0.7)
Eosinophils Relative: 1 %
HEMATOCRIT: 41.7 % (ref 36.0–46.0)
HEMOGLOBIN: 13.7 g/dL (ref 12.0–15.0)
Lymphocytes Relative: 22 %
Lymphs Abs: 2 10*3/uL (ref 0.7–4.0)
MCH: 30.1 pg (ref 26.0–34.0)
MCHC: 32.9 g/dL (ref 30.0–36.0)
MCV: 91.6 fL (ref 78.0–100.0)
Monocytes Absolute: 0.4 10*3/uL (ref 0.1–1.0)
Monocytes Relative: 4 %
NEUTROS ABS: 6.6 10*3/uL (ref 1.7–7.7)
Neutrophils Relative %: 73 %
Platelets: 305 10*3/uL (ref 150–400)
RBC: 4.55 MIL/uL (ref 3.87–5.11)
RDW: 13.5 % (ref 11.5–15.5)
WBC: 9 10*3/uL (ref 4.0–10.5)

## 2016-02-08 MED ORDER — CEPHALEXIN 500 MG PO CAPS: 500.0000 mg | ORAL_CAPSULE | Freq: Four times a day (QID) | ORAL | 0 refills | Status: DC

## 2016-02-08 MED ORDER — ACYCLOVIR 400 MG PO TABS: 400.0000 mg | ORAL_TABLET | Freq: Every day | ORAL | 0 refills | Status: DC

## 2016-02-08 NOTE — Discharge Instructions (Signed)
Restart medications as prescribed. Follow up with a family doctor for recheck and further evaluation and treatment.

## 2016-02-08 NOTE — ED Notes (Signed)
Tatyana PA at bedside   

## 2016-02-08 NOTE — ED Notes (Signed)
Video visit coupon number and handout given to patient on discharge

## 2016-02-08 NOTE — ED Provider Notes (Signed)
[facility]PT Provider Note   CSN: [id number] Arrival date & time: 02/08/16  1035  By signing my name below, I, Majel Homer, attest that this documentation has been prepared under the direction and in the presence of Niralya Ohanian, PA-C . Electronically Signed: Majel Homer, Scribe. 02/08/2016. 12:05 PM.  History   Chief Complaint Chief Complaint  Patient presents with  . Oral Swelling   The history is provided by the patient. No language interpreter was used.   HPI Comments: Darlene Rodriguez is a 36 y.o. female with PMHx of HTN, who presents to the Emergency Department complaining of gradually worsening, bilateral lip swelling and pain that began ~4 days ago. Pt reports she visited the ED on 01/17/16 for the same symptoms and was prescribed Zovirax. She notes complete relief with this medication but states her symptoms returned ~3 days after ending this medication. Pt reports she has been applying Anbesol and over the counter cold sore medication with no relief. 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 She denies kissing anyone with similar symptoms or hx of HIV.   Past Medical History:  Diagnosis Date  . Hypertension     Patient Active Problem List   Diagnosis Date Noted  . Essential hypertension 02/09/2014  . Pre-diabetes 02/05/2014  . Acute parotitis 02/02/2014  . Morbid obesity (HCC) 02/02/2014    Past Surgical History:  Procedure Laterality Date  . CESAREAN SECTION    . TONSILLECTOMY      OB History    No data available     Home Medications    Prior to Admission medications   Medication Sig Start Date End Date Taking? Authorizing Provider  acyclovir (ZOVIRAX) 400 MG tablet Take 1 tablet (400 mg total) by mouth 5 (five) times daily. 01/17/16   Green Quincy, PA-C  cephALEXin (KEFLEX) 500 MG capsule Take 1 capsule (500 mg total) by mouth 4 (four) times daily. 01/20/16   Roxy Horseman, PA-C    HYDROcodone-acetaminophen (NORCO/VICODIN) 5-325 MG tablet Take 1-2 tablets by mouth every 6 (six) hours as needed. 01/20/16   Roxy Horseman, PA-C    Family History Family History  Problem Relation Age of Onset  . Lung cancer Father   . Heart disease Maternal Grandfather     Social History Social History  Substance Use Topics  . Smoking status: Never Smoker  . Smokeless tobacco: Never Used  . Alcohol use No     Allergies   Diphenhydramine   Review of Systems Review of Systems  Constitutional: Negative for fever.  HENT: Positive for facial swelling (bilateral lip swelling) and mouth sores.    Physical Exam Updated Vital Signs BP 125/95 (BP Location: Right Arm)   Pulse 97   Temp 97.9 F (36.6 C) (Oral)   Resp 20   Ht 5\' 4"  (1.626 m)   Wt (!) 338 lb (153.3 kg)   SpO2 100%   BMI 58.02 kg/m   Physical Exam  Constitutional: She is oriented to person, place, and time. She appears well-developed and well-nourished.  HENT:  Head: Normocephalic.  Upper and lower lip swelling, erythema, with crusted lesions. No oral mucosal lesions.   Eyes: EOM are normal.  Neck: Normal range of motion.  Cardiovascular: Normal rate, regular rhythm and normal heart sounds.   Pulmonary/Chest: Effort normal and breath sounds normal.  Abdominal: She exhibits no distension.  Musculoskeletal: Normal range of motion.  Neurological: She is alert and oriented to person, place,  and time.  Psychiatric: She has a normal mood and affect.  Nursing note and vitals reviewed.  ED Treatments / Results  Labs (all labs ordered are listed, but only abnormal results are displayed) Labs Reviewed - No data to display  EKG  EKG Interpretation None       Radiology No results found.  Procedures Procedures (including critical care time)  Medications Ordered in ED Medications - No data to display  DIAGNOSTIC STUDIES:  Oxygen Saturation is 100% on RA, normal by my interpretation.     COORDINATION OF CARE:  12:02 PM Discussed treatment plan with pt at bedside and pt agreed to plan.  Initial Impression / Assessment and Plan / ED Course  I have reviewed the triage vital signs and the nursing notes.  Pertinent labs & imaging results that were available during my care of the patient were reviewed by me and considered in my medical decision making (see chart for details).  Clinical Course    Pt in ED with complaint of recurrent lip blisters. States blisters cleared up with acyclovir and Keflex. Came back just a few days after. We will check labs including HIV and syphilis. Will restart on Keflex and acyclovir. I discussed this with Dr. Rosalia Hammersay, at this time we will have her follow up with primary care doctor for further evaluation and treatment, and possibly referral to ID. No oral mucosal lesions. Pt is non toxic appearing afebrile. Does not take any other medications. Doubt drug reaction.   Vitals:   02/08/16 1116 02/08/16 1119 02/08/16 1355  BP:  125/95 (!) 158/109  Pulse: 97  98  Resp: 20  19  Temp: 97.9 F (36.6 C)  98.2 F (36.8 C)  TempSrc: Oral  Oral  SpO2: 100%  100%  Weight: (!) 153.3 kg    Height: 5\' 4"  (1.626 m)       Final Clinical Impressions(s) / ED Diagnoses   Final diagnoses:  None    New Prescriptions New Prescriptions   No medications on file     Jaynie Crumbleatyana Aleanna Menge, PA-C 02/08/16 1402    Margarita Grizzleanielle Ray, MD 02/08/16 1609

## 2016-02-08 NOTE — ED Triage Notes (Signed)
Pt. Has been seen by our ED  On 12/15, and was treated for herpex /cold blisters on her upper and lower lips.  She finished the medication and after 5-7 days the symptoms have returned.   Her lips are swollen and has blisters.  She denies any other symptoms, no fevers, chills, sore throat, n/v/d.  Airway patent.

## 2016-02-09 LAB — RPR, QUANT+TP ABS (REFLEX)
Rapid Plasma Reagin, Quant: 1:1 {titer} — ABNORMAL HIGH
T Pallidum Abs: NEGATIVE

## 2016-02-09 LAB — RPR: RPR: REACTIVE — AB

## 2016-02-09 LAB — HIV ANTIBODY (ROUTINE TESTING W REFLEX): HIV Screen 4th Generation wRfx: NONREACTIVE

## 2016-02-16 ENCOUNTER — Encounter (HOSPITAL_COMMUNITY): Payer: Self-pay | Admitting: Emergency Medicine

## 2016-02-25 ENCOUNTER — Encounter (HOSPITAL_COMMUNITY): Payer: Self-pay | Admitting: Emergency Medicine

## 2016-02-25 ENCOUNTER — Emergency Department (HOSPITAL_COMMUNITY)
Admission: EM | Admit: 2016-02-25 | Discharge: 2016-02-25 | Disposition: A | Discharge status: Home / Self Care | Payer: Self-pay | Attending: Emergency Medicine | Admitting: Emergency Medicine

## 2016-02-25 DIAGNOSIS — Z79899 Other long term (current) drug therapy: Secondary | ICD-10-CM | POA: Insufficient documentation

## 2016-02-25 DIAGNOSIS — A539 Syphilis, unspecified: Secondary | ICD-10-CM | POA: Insufficient documentation

## 2016-02-25 DIAGNOSIS — B001 Herpesviral vesicular dermatitis: Secondary | ICD-10-CM | POA: Insufficient documentation

## 2016-02-25 DIAGNOSIS — L0100 Impetigo, unspecified: Secondary | ICD-10-CM | POA: Insufficient documentation

## 2016-02-25 DIAGNOSIS — I10 Essential (primary) hypertension: Secondary | ICD-10-CM | POA: Insufficient documentation

## 2016-02-25 MED ORDER — CEPHALEXIN 500 MG PO CAPS: 500.0000 mg | ORAL_CAPSULE | Freq: Four times a day (QID) | ORAL | 0 refills | Status: DC

## 2016-02-25 MED ORDER — ACYCLOVIR 400 MG PO TABS: 400.0000 mg | ORAL_TABLET | Freq: Every day | ORAL | 0 refills | Status: DC

## 2016-02-25 MED ORDER — PENICILLIN G BENZATHINE & PROC 1200000 UNIT/2ML IM SUSP
1.2000 10*6.[IU] | Freq: Once | INTRAMUSCULAR | Status: DC
  Filled 2016-02-25: qty 2

## 2016-02-25 MED ORDER — PENICILLIN G BENZATHINE 1200000 UNIT/2ML IM SUSP
1.2000 10*6.[IU] | Freq: Once | INTRAMUSCULAR | Status: AC
  Administered 2016-02-25: 1.2 10*6.[IU] via INTRAMUSCULAR

## 2016-02-25 NOTE — ED Triage Notes (Signed)
Pt here with fever blisters has been seen for same 2 weeks ago.

## 2016-02-25 NOTE — ED Notes (Signed)
Patient is A&Ox4 at this time.  Patient in no signs of distress.  Please see providers note for complete history and physical exam.  

## 2016-02-25 NOTE — ED Notes (Signed)
Patient Alert and oriented X4. Stable and ambulatory. Patient verbalized understanding of the discharge instructions.  Patient belongings were taken by the patient.  

## 2016-02-25 NOTE — Discharge Instructions (Signed)
As we discussed you tested positive for syphilis, please read the attached information on syphilis. The rash on your lips is most likely due to herpes, please repeat the attached information on herpes. You have been prescribed acyclovir and Keflex, please take these medications as prescribed. You need to follow up with an infectious disease specialist for further discussion of your symptoms. If you're unable to make an appointment or afford an appointment with the infectious disease specialist [facility] for further discussion of your symptoms.   Please see attached financial resource guide.

## 2016-02-25 NOTE — ED Provider Notes (Signed)
[facility]EMER[facility] DE[facility] Provider Note   CSN: [id number] Arrival date & time: 02/25/16  1630  By signing my name below, I, Rosario AdieWilliam Andrew Hiatt, attest that this documentation has been prepared under the direction and in the presence of Sharen Hecklaudia Ranveer Wahlstrom, PA-C.  Electronically Signed: Rosario AdieWilliam Andrew Hiatt, ED Scribe. 02/25/16. 8:31 PM.  History   Chief Complaint Chief Complaint  Patient presents with  . fever blisters   Patient gave verbal permission to utilize photo for medical documentation only. The image was not stored on any personal device.  The history is provided by the patient and medical records. No language interpreter was used.    HPI Comments: Darlene Rodriguez is a 36 y.o. female with a h/o HTN and morbid obesity, who presents to the Emergency Department complaining of a recurrent, gradually worsening area of rash, swelling, and pain to the upper and lower lips onset two days ago. Pt reports that she has been seen in the ED twice for this issue (most recently on 02/08/16), and both times she has been prescribed Acyclovir and Keflex each time which temporarily relieved her symptoms during the course of the medications but after 3-4 days of finishing medications the rash always returns. Pt has been cleaning her lips at home but without significant relief. Her pain to the lip is exacerbated with palpation over the area. Pt has had normal PO intake since the onset of her symptoms. She denies fever, chest pain, shortness of breath, abdominal pain, rash elsewhere, nausea, vomiting, difficulty swallowing, or any other associated symptoms.   Of note, during pt's last visit lab work was remarkable for reactive RPR.  Pt states she was never notified about the results.  Past Medical History:  Diagnosis Date  . Hypertension    Patient Active Problem List   Diagnosis Date Noted  . Essential hypertension 02/09/2014  . Pre-diabetes 02/05/2014  . Acute parotitis 02/02/2014  . Morbid obesity  (HCC) 02/02/2014   Past Surgical History:  Procedure Laterality Date  . CESAREAN SECTION    . TONSILLECTOMY     OB History    No data available     Home Medications    Prior to Admission medications   Medication Sig Start Date End Date Taking? Authorizing Provider  acyclovir (ZOVIRAX) 400 MG tablet Take 1 tablet (400 mg total) by mouth 5 (five) times daily. 02/25/16   Liberty Handylaudia J Aniaya Bacha, PA-C  cephALEXin (KEFLEX) 500 MG capsule Take 1 capsule (500 mg total) by mouth 4 (four) times daily. 02/25/16   Liberty Handylaudia J Lakeyia Surber, PA-C  HYDROcodone-acetaminophen (NORCO/VICODIN) 5-325 MG tablet Take 1-2 tablets by mouth every 6 (six) hours as needed. 01/20/16   Roxy Horsemanobert Browning, PA-C   Family History Family History  Problem Relation Age of Onset  . Lung cancer Father   . Heart disease Maternal Grandfather    Social History Social History  Substance Use Topics  . Smoking status: Never Smoker  . Smokeless tobacco: Never Used  . Alcohol use No   Allergies   Diphenhydramine  Review of Systems Review of Systems  Constitutional: Negative for appetite change and fever.  HENT: Positive for facial swelling (lip swelling). Negative for congestion, sore throat and trouble swallowing.   Eyes: Negative for visual disturbance.  Respiratory: Negative for cough and shortness of breath.   Cardiovascular: Negative for chest pain.  Gastrointestinal: Negative for abdominal pain, constipation, diarrhea, nausea and vomiting.  Genitourinary: Negative for difficulty urinating.  Musculoskeletal: Negative for arthralgias.  Skin: Positive for rash (lips).  Neurological: Negative for dizziness, weakness, light-headedness and headaches.   Physical Exam Updated Vital Signs BP 157/96 (BP Location: Right Arm)   Pulse 108   Temp 98.3 F (36.8 C) (Oral)   Resp 20   Ht 5\' 4"  (1.626 m)   Wt (!) 153.3 kg   SpO2 98%   BMI 58.02 kg/m   Physical Exam  Constitutional: She is oriented to person, place, and time.  She appears well-developed and well-nourished. No distress.  NAD.  HENT:  Head: Normocephalic and atraumatic.  Right Ear: External ear normal.  Left Ear: External ear normal.  Nose: Nose normal.  Mouth/Throat: Oropharynx is clear and moist. No oropharyngeal exudate.  Moist mucous membranes. No lesions in oropharynx, tongue, sublingual space, or gingiva.  No nasal mucosa edema. Non-bulging, pearly gray TMs with cone of light without lesions or erythema. No middle ear erythema or edema.   See image: blister-like rash over upper and lower lip with purulence, tenderness.    Eyes: Conjunctivae and EOM are normal. Pupils are equal, round, and reactive to light. No scleral icterus.  Neck: Normal range of motion. Neck supple. No JVD present.  Cardiovascular: Normal rate, regular rhythm and normal heart sounds.   No murmur heard. Pulmonary/Chest: Effort normal and breath sounds normal. She has no wheezes.  Abdominal: Soft. There is no tenderness.  No CVAT bilaterally. No suprapubic tenderness. Negative Murphy's. Negative McBurney's. Negative Psoas sign.  Musculoskeletal: Normal range of motion. She exhibits no deformity.  Lymphadenopathy:    She has no cervical adenopathy.  Neurological: She is alert and oriented to person, place, and time.  Skin: Skin is warm and dry. Capillary refill takes less than 2 seconds.  No rash noted in anterior/posterior trunk, palms of hands, or soles of feet.   Psychiatric: She has a normal mood and affect. Her behavior is normal. Judgment and thought content normal.  Nursing note and vitals reviewed.    ED Treatments / Results  DIAGNOSTIC STUDIES: Oxygen Saturation is 98% on RA, normal by my interpretation.   COORDINATION OF CARE: 8:31 PM-Discussed next steps with pt. Pt verbalized understanding and is agreeable with the plan.   Labs (all labs ordered are listed, but only abnormal results are displayed) Labs Reviewed - No data to display  EKG  EKG  Interpretation None      Radiology No results found.  Procedures Procedures   Medications Ordered in ED Medications  penicillin g benzathine (BICILLIN LA) 1200000 UNIT/2ML injection 1.2 Million Units (1.2 Million Units Intramuscular Given 02/25/16 2249)    Initial Impression / Assessment and Plan / ED Course  I have reviewed the triage vital signs and the nursing notes.  Pertinent labs & imaging results that were available during my care of the patient were reviewed by me and considered in my medical decision making (see chart for details).     36 yo female with recurrent blister like rash on lips with purulent, honey colored crusts that consistently resolve with acyclovir and keflex.  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  Per chart review patient has been treated twice in ED for the same, most recently on 02/13/16.  At last ED visit HIV/RPR testing was ordered.  HIV testing negative but RPR was reactive, patient had not been notified of results.  I had a long discussion with patient about Syphilis, she was treated in ED. Pt will be discharged with acyclovir and keflex again for current rash.  I suspect she may need  some suppressive antiviral treatment for recurrent herpes flares.  I gave patient contact information for both ID and [facility], encouraged patient to follow up for further discussion of symptoms and possible suppressive tx.  Syphilis and STD patient information given. Physicist, medical given. Pt was discussed with Dr Erma Heritage who agrees with ED tx and discharge plan.   Final Clinical Impressions(s) / ED Diagnoses   Final diagnoses:  Recurrent herpes labialis  Syphilis  Impetigo   New Prescriptions Discharge Medication List as of 02/25/2016 10:31 PM     I personally performed the services described in this documentation, which was scribed in my presence. The recorded information has been reviewed and is  accurate.      Liberty Handy, PA-C 02/27/16 1249    Shaune Pollack, MD 02/27/16 2039

## 2016-03-10 ENCOUNTER — Encounter (HOSPITAL_COMMUNITY): Payer: Self-pay

## 2016-03-10 ENCOUNTER — Emergency Department (HOSPITAL_COMMUNITY)
Admission: EM | Admit: 2016-03-10 | Discharge: 2016-03-10 | Disposition: A | Discharge status: Home / Self Care | Payer: Self-pay | Attending: Emergency Medicine | Admitting: Emergency Medicine

## 2016-03-10 DIAGNOSIS — I10 Essential (primary) hypertension: Secondary | ICD-10-CM | POA: Insufficient documentation

## 2016-03-10 DIAGNOSIS — B001 Herpesviral vesicular dermatitis: Secondary | ICD-10-CM | POA: Insufficient documentation

## 2016-03-10 DIAGNOSIS — A539 Syphilis, unspecified: Secondary | ICD-10-CM | POA: Insufficient documentation

## 2016-03-10 MED ORDER — ACYCLOVIR 400 MG PO TABS: 400.0000 mg | ORAL_TABLET | Freq: Three times a day (TID) | ORAL | 0 refills | Status: DC

## 2016-03-10 MED ORDER — PENICILLIN G BENZATHINE 1200000 UNIT/2ML IM SUSP
2.4000 10*6.[IU] | Freq: Once | INTRAMUSCULAR | Status: AC
  Administered 2016-03-10: 2.4 10*6.[IU] via INTRAMUSCULAR
  Filled 2016-03-10 (×2): qty 4

## 2016-03-10 MED ORDER — CEFTRIAXONE SODIUM 250 MG IJ SOLR
250.0000 mg | Freq: Once | INTRAMUSCULAR | Status: AC
  Administered 2016-03-10: 250 mg via INTRAMUSCULAR
  Filled 2016-03-10: qty 250

## 2016-03-10 MED ORDER — DOXYCYCLINE HYCLATE 100 MG PO CAPS: 100.0000 mg | ORAL_CAPSULE | Freq: Two times a day (BID) | ORAL | 0 refills | Status: DC

## 2016-03-10 MED ORDER — LIDOCAINE HCL (PF) 1 % IJ SOLN
INTRAMUSCULAR | Status: AC
  Administered 2016-03-10: 5 mL
  Filled 2016-03-10: qty 5

## 2016-03-10 NOTE — ED Provider Notes (Signed)
[facility]-EMERGENCY DEPT Provider Note    By signing my name below, I, Earmon Phoenix, attest that this documentation has been prepared under the direction and in the presence of [facility], [place]. Electronically Signed: Earmon Phoenix, ED Scribe. 03/10/16. 10:35 PM.    History   Chief Complaint Chief Complaint  Patient presents with  . Blister   HPI  Darlene Rodriguez is a morbidly obese 36 y.o. female with PMHx of HTN who presents to the Emergency Department complaining of recurrent blisters to upper and lower lips that has been ongoing for the past two months. She reports associated cracking of the lips. Pt was seen here about two weeks ago, treated for a reactive RPR (done 02/08/16) with 1.2 million units of Bicillin and prescribe Keflex and Acyclovir which she reports taking as directed. She states the blisters resolved temporarily but returned after finishing the antibiotics. She has not taken anything for pain relief. She denies modifying factors. She denies fever, chills, nausea, vomiting, mouth lesions or rashes.   Past Medical History:  Diagnosis Date  . Hypertension     Patient Active Problem List   Diagnosis Date Noted  . Essential hypertension 02/09/2014  . Pre-diabetes 02/05/2014  . Acute parotitis 02/02/2014  . Morbid obesity (HCC) 02/02/2014    Past Surgical History:  Procedure Laterality Date  . CESAREAN SECTION    . TONSILLECTOMY      OB History    No data available       Home Medications    Prior to Admission medications   Medication Sig Start Date End Date Taking? Authorizing Provider  acyclovir (ZOVIRAX) 400 MG tablet Take 1 tablet (400 mg total) by mouth 3 (three) times daily. 03/10/16   Little Winton Orlene Och, NP  doxycycline (VIBRAMYCIN) 100 MG capsule Take 1 capsule (100 mg total) by mouth 2 (two) times daily. 03/10/16   Kimmberly Wisser Orlene Och, NP  HYDROcodone-acetaminophen (NORCO/VICODIN) 5-325 MG tablet Take 1-2 tablets by mouth every 6 (six) hours as needed.  01/20/16   Roxy Horseman, PA-C    Family History Family History  Problem Relation Age of Onset  . Lung cancer Father   . Heart disease Maternal Grandfather     Social History Social History  Substance Use Topics  . Smoking status: Never Smoker  . Smokeless tobacco: Never Used  . Alcohol use No     Allergies   Diphenhydramine   Review of Systems Review of Systems  Constitutional: Negative for chills and fever.  HENT: Positive for mouth sores. Negative for congestion, ear pain, facial swelling and sore throat.   Eyes: Negative for discharge.  Respiratory: Negative for cough and chest tightness.   Gastrointestinal: Negative for abdominal pain.  Genitourinary: Negative for difficulty urinating.  Musculoskeletal: Negative for back pain.  Skin: Positive for wound (blisters to both lips). Negative for color change and rash.  Neurological: Negative for headaches.     Physical Exam Updated Vital Signs BP (!) 159/111 (BP Location: Left Arm)   Pulse 104   Temp 98.2 F (36.8 C) (Oral)   Resp 18   Ht 5\' 4"  (1.626 m)   Wt (!) 338 lb (153.3 kg)   LMP 02/22/2016 (Approximate)   SpO2 95%   BMI 58.02 kg/m   Physical Exam  Constitutional: She is oriented to person, place, and time. She appears well-developed and well-nourished. No distress.  HENT:  Blisters to top and bottom lips; not as bad as previous visit. No ulcerations inside mouth, blister areas are  limited to outer lips only. No difficulty swallowing.  Eyes: EOM are normal.  Neck: Normal range of motion. Neck supple.  Cardiovascular: Tachycardia present.   Pulmonary/Chest: Effort normal.  Musculoskeletal: Normal range of motion.  Neurological: She is alert and oriented to person, place, and time. No cranial nerve deficit.  Skin: Skin is warm and dry.  Psychiatric: She has a normal mood and affect. Her behavior is normal.  Nursing note and vitals reviewed.    ED Treatments / Results  DIAGNOSTIC  STUDIES: Oxygen Saturation is 95% on RA, adequate by my interpretation.   COORDINATION OF CARE: 10:08 PM- Will speak with Dr. Hyacinth MeekerMiller about appropriate course of treatment. Pt verbalizes understanding and agrees to plan.  Discussed with Dr. Hyacinth MeekerMiller positive RPR and that patient treated on last visit with 1.2 M/U of penicillin rather than 2.4. Will give 2.4 M/U tonight and in addition treat with Rocephin 250 mg and Doxycycline Rx. Encouraged patient to f/u with the health department or [facility]   10:19 PM- Will give Bicillin LA 2.4 million units and an injection of Rocephin. Will prescribe Doxycycline and Valtrex.   Medications  penicillin g benzathine (BICILLIN LA) 1200000 UNIT/2ML injection 2.4 Million Units (2.4 Million Units Intramuscular Given 03/10/16 2251)  cefTRIAXone (ROCEPHIN) injection 250 mg (250 mg Intramuscular Given 03/10/16 2250)  lidocaine (PF) (XYLOCAINE) 1 % injection (5 mLs  Given 03/10/16 2250)   Radiology No results found.  Procedures Procedures (including critical care time)  Medications Ordered in ED Medications  penicillin g benzathine (BICILLIN LA) 1200000 UNIT/2ML injection 2.4 Million Units (2.4 Million Units Intramuscular Given 03/10/16 2251)  cefTRIAXone (ROCEPHIN) injection 250 mg (250 mg Intramuscular Given 03/10/16 2250)  lidocaine (PF) (XYLOCAINE) 1 % injection (5 mLs  Given 03/10/16 2250)     Initial Impression / Assessment and Plan / ED Course  I have reviewed the triage vital signs and the nursing notes.    Pt arrives for recurrent fever blisters to both lips that have been occurring for the past couple months. Pt was seen here about 2-3 weeks ago and was treated for a previously reactive RPR test with Bicillin LA 1.2 million units and given a prescription for Keflex and Acyclovir. Will treat with Bicillin LA 2.4 million units today, Rocephin 250 mg injection and will prescribe Doxycyline and Acyclovir for pt to continue therapy at home. Pt  is advised to follow up for free testing at local health department or as discussed at previous visit referral to ID. Pt appears safe for discharge.     Final Clinical Impressions(s) / ED Diagnoses   Final diagnoses:  Herpes labialis  Syphilis    New Prescriptions Discharge Medication List as of 03/10/2016 10:28 PM    START taking these medications   Details  doxycycline (VIBRAMYCIN) 100 MG capsule Take 1 capsule (100 mg total) by mouth 2 (two) times daily., Starting Sat 03/10/2016, Print         [place]ills[place]Hope M Clarkson Rosselli, NP 03/11/16 0200    Eber HongBrian Miller, MD 03/21/16 [phone]

## 2016-03-10 NOTE — ED Triage Notes (Signed)
Pt endorses recurrent fever blisters x 2 months, pt seen here 2 weeks ago for same and given antibiotics, fever blisters went away and 3 days after antibiotics they came back. Cracking noted to lips.

## 2016-03-10 NOTE — Discharge Instructions (Signed)
Call the Infectious Disease Department (Dr. Roxan Hockeyobinson) or go to the health department for follow up.

## 2016-03-10 NOTE — ED Notes (Signed)
Pt verbalized understanding discharge instructions and denies any further needs or questions at this time. VS stable, ambulatory and steady gait.   

## 2017-02-27 ENCOUNTER — Other Ambulatory Visit: Payer: Self-pay

## 2017-02-27 ENCOUNTER — Encounter (HOSPITAL_COMMUNITY): Payer: Self-pay | Admitting: Emergency Medicine

## 2017-02-27 ENCOUNTER — Ambulatory Visit (HOSPITAL_COMMUNITY)
Admission: EM | Admit: 2017-02-27 | Discharge: 2017-02-27 | Disposition: A | Discharge status: Home / Self Care | Payer: Self-pay | Attending: Family Medicine | Admitting: Family Medicine

## 2017-02-27 DIAGNOSIS — J029 Acute pharyngitis, unspecified: Secondary | ICD-10-CM

## 2017-02-27 DIAGNOSIS — J019 Acute sinusitis, unspecified: Secondary | ICD-10-CM

## 2017-02-27 DIAGNOSIS — Z20828 Contact with and (suspected) exposure to other viral communicable diseases: Secondary | ICD-10-CM

## 2017-02-27 DIAGNOSIS — R05 Cough: Secondary | ICD-10-CM

## 2017-02-27 MED ORDER — AMOXICILLIN-POT CLAVULANATE 875-125 MG PO TABS: 1.0000 | ORAL_TABLET | Freq: Two times a day (BID) | ORAL | 0 refills | Status: DC

## 2017-02-27 MED ORDER — OSELTAMIVIR PHOSPHATE 75 MG PO CAPS: 75.0000 mg | ORAL_CAPSULE | Freq: Two times a day (BID) | ORAL | 0 refills | Status: DC

## 2017-02-27 NOTE — ED Triage Notes (Signed)
Sinus/head congestion/pressure for 1 1/2 weeks.  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

## 2017-02-27 NOTE — ED Provider Notes (Signed)
[facility]    CSN: [id number] Arrival date & time: 02/27/17  1934     History   Chief Complaint Chief Complaint  Patient presents with  . URI    HPI Darlene Rodriguez is a 37 y.o. female.   37 year old obese female, with history of hypertension, presenting today complaining of possible sinus infection.  Patient states that she has had nasal congestion, postnasal drip, sore throat, cough and sinus pressure over the past 1.5 weeks.  States that her symptoms have been gradually worsening.  States that this morning, she woke up with fever and generalized body aches.  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 She denies any chest pain, shortness of breath, headache, neck pain or stiffness.   The history is provided by the patient.  URI  Presenting symptoms: congestion, cough, fatigue, fever, rhinorrhea and sore throat   Presenting symptoms: no ear pain   Severity:  Moderate Onset quality:  Gradual Duration:  2 weeks Timing:  Constant Progression:  Worsening Chronicity:  New Relieved by:  Nothing Worsened by:  Nothing Ineffective treatments:  Decongestant and OTC medications Associated symptoms: headaches, myalgias and sinus pain   Associated symptoms: no arthralgias, no swollen glands and no wheezing   Risk factors: sick contacts   Risk factors: not elderly, no chronic cardiac disease, no chronic kidney disease, no chronic respiratory disease, no diabetes mellitus, no immunosuppression, no recent illness and no recent travel     Past Medical History:  Diagnosis Date  . Hypertension     Patient Active Problem List   Diagnosis Date Noted  . Essential hypertension 02/09/2014  . Pre-diabetes 02/05/2014  . Acute parotitis 02/02/2014  . Morbid obesity (HCC) 02/02/2014    Past Surgical History:  Procedure Laterality Date  . CESAREAN SECTION    . TONSILLECTOMY      OB History    No data available       Home Medications    Prior to Admission medications   Medication Sig Start Date End Date Taking? Authorizing Provider  acetaminophen (TYLENOL) 325 MG tablet Take 650 mg by mouth every 6 (six) hours as needed.   Yes [provider]  guaiFENesin (MUCINEX) 600 MG 12 hr tablet Take by mouth 2 (two) times daily.   Yes [provider]  Pseudoephedrine-Acetaminophen (CHILDRENS TYLENOL SINUS PO) Take by mouth.   Yes [provider]  acyclovir (ZOVIRAX) 400 MG tablet Take 1 tablet (400 mg total) by mouth 3 (three) times daily. 03/10/16   Janne NapoleonNeese, Hope M, NP  amoxicillin-clavulanate (AUGMENTIN) 875-125 MG tablet Take 1 tablet by mouth every 12 (twelve) hours. 02/27/17   Leontae Bostock C, PA-C  doxycycline (VIBRAMYCIN) 100 MG capsule Take 1 capsule (100 mg total) by mouth 2 (two) times daily. 03/10/16   Janne NapoleonNeese, Hope M, NP  HYDROcodone-acetaminophen (NORCO/VICODIN) 5-325 MG tablet Take 1-2 tablets by mouth every 6 (six) hours as needed. 01/20/16   Roxy HorsemanBrowning, Robert, PA-C  oseltamivir (TAMIFLU) 75 MG capsule Take 1 capsule (75 mg total) by mouth every 12 (twelve) hours. 02/27/17   Romir Klimowicz, Marylene Landlivia C, PA-C    Family History Family History  Problem Relation Age of Onset  . Lung cancer Father   . Heart disease Maternal Grandfather     Social History Social History   Tobacco Use  . Smoking status: Never Smoker  . Smokeless tobacco: Never Used  Substance Use Topics  . Alcohol use: No  .  Drug use: No     Allergies   Diphenhydramine   Review of Systems Review of Systems  Constitutional: Positive for fatigue and fever. Negative for chills.  HENT: Positive for congestion, rhinorrhea, sinus pain and sore throat. Negative for ear pain.   Eyes: Negative for pain and visual disturbance.  Respiratory: Positive for cough. Negative for shortness of breath and wheezing.   Cardiovascular: Negative for chest pain and palpitations.  Gastrointestinal: Negative for abdominal pain and  vomiting.  Genitourinary: Negative for dysuria and hematuria.  Musculoskeletal: Positive for myalgias. Negative for arthralgias and back pain.  Skin: Negative for color change and rash.  Neurological: Positive for headaches. Negative for seizures and syncope.  All other systems reviewed and are negative.    Physical Exam Triage Vital Signs ED Triage Vitals  Enc Vitals Group     BP 02/27/17 1950 (!) 160/113     Pulse Rate 02/27/17 1950 (!) 110     Resp 02/27/17 1950 (!) 24     Temp 02/27/17 1950 99 F (37.2 C)     Temp Source 02/27/17 1950 Oral     SpO2 02/27/17 1950 96 %     Weight --      Height --      Head Circumference --      Peak Flow --      Pain Score 02/27/17 1948 6     Pain Loc --      Pain Edu? --      Excl. in GC? --    No data found.  Updated Vital Signs BP (!) 160/113 (BP Location: Right Arm) Comment (BP Location): regular, right forearm  Pulse (!) 110   Temp 99 F (37.2 C) (Oral)   Resp (!) 24   SpO2 96%   Visual Acuity Right Eye Distance:   Left Eye Distance:   Bilateral Distance:    Right Eye Near:   Left Eye Near:    Bilateral Near:     Physical Exam  Constitutional: She appears well-developed and well-nourished. No distress.  HENT:  Head: Normocephalic and atraumatic.  Right Ear: Hearing, tympanic membrane, external ear and ear canal normal.  Left Ear: Hearing, tympanic membrane, external ear and ear canal normal.  Nose: Right sinus exhibits maxillary sinus tenderness and frontal sinus tenderness. Left sinus exhibits maxillary sinus tenderness and frontal sinus tenderness.  Mouth/Throat: Posterior oropharyngeal erythema present. No oropharyngeal exudate, posterior oropharyngeal edema or tonsillar abscesses.  Eyes: Conjunctivae are normal.  Neck: Neck supple.  Cardiovascular: Normal rate and regular rhythm.  No murmur heard. Pulmonary/Chest: Effort normal and breath sounds normal. No stridor. No respiratory distress. She has no wheezes.  She has no rhonchi. She has no rales.  Abdominal: Soft. There is no tenderness.  Musculoskeletal: She exhibits no edema.  Neurological: She is alert.  Skin: Skin is warm and dry.  Psychiatric: She has a normal mood and affect.  Nursing note and vitals reviewed.    UC Treatments / Results  Labs (all labs ordered are listed, but only abnormal results are displayed) Labs Reviewed - No data to display  EKG  EKG Interpretation None       Radiology No results found.  Procedures Procedures (including critical care time)  Medications Ordered in UC Medications - No data to display   Initial Impression / Assessment and Plan / UC Course  I have reviewed the triage vital signs and the nursing notes.  Pertinent labs & imaging results that were available during  my care of the patient were reviewed by me and considered in my medical decision making (see chart for details).     History and exam consistent with sinusitis.  Patient is also concerned about recent exposure to influenza.  Would like to be treated with Tamiflu.  Final Clinical Impressions(s) / UC Diagnoses   Final diagnoses:  Acute sinusitis, recurrence not specified, unspecified location  Exposure to influenza    ED Discharge Orders        Ordered    amoxicillin-clavulanate (AUGMENTIN) 875-125 MG tablet  Every 12 hours     02/27/17 2000    oseltamivir (TAMIFLU) 75 MG capsule  Every 12 hours     02/27/17 2000       Controlled Substance Prescriptions [place] Controlled Substance Registry consulted? Not Applicable   Alecia Lemming, [place] 02/27/17 2015

## 2017-03-04 ENCOUNTER — Encounter (HOSPITAL_COMMUNITY): Payer: Self-pay | Admitting: Emergency Medicine

## 2017-03-04 ENCOUNTER — Ambulatory Visit (HOSPITAL_COMMUNITY)
Admission: EM | Admit: 2017-03-04 | Discharge: 2017-03-04 | Disposition: A | Discharge status: Home / Self Care | Payer: Self-pay | Attending: Family Medicine | Admitting: Family Medicine

## 2017-03-04 DIAGNOSIS — J0140 Acute pansinusitis, unspecified: Secondary | ICD-10-CM

## 2017-03-04 MED ORDER — PREDNISONE 20 MG PO TABS: 40.0000 mg | ORAL_TABLET | Freq: Every day | ORAL | 0 refills | Status: AC

## 2017-03-04 MED ORDER — FLUTICASONE PROPIONATE 50 MCG/ACT NA SUSP: 2.0000 | Freq: Every day | NASAL | 0 refills | Status: DC

## 2017-03-04 MED ORDER — IPRATROPIUM BROMIDE 0.06 % NA SOLN: 2.0000 | Freq: Four times a day (QID) | NASAL | 12 refills | Status: DC

## 2017-03-04 NOTE — ED Provider Notes (Signed)
[facility]    CSN: [id number] Arrival date & time: 03/04/17  1313     History   Chief Complaint Chief Complaint  Patient presents with  . Facial Pain    HPI Darlene Rodriguez is a 37 y.o. female.   37 year old female comes in for continued sinus symptoms after being seen 5 days ago.  At that time, she was being treated for sinus infection with Augmentin as well as the flu with Tamiflu.  States that the body aches and fevers has since resolved.  She complains of continued ear pain, sore throat, nasal congestion, rhinorrhea, facial pressure.  Has been taking Augmentin as directed with 2 days left.  She has discontinued her OTC medications after starting Augmentin. Denies chest pain, shortness of breath, wheezing.       Past Medical History:  Diagnosis Date  . Hypertension     Patient Active Problem List   Diagnosis Date Noted  . Essential hypertension 02/09/2014  . Pre-diabetes 02/05/2014  . Acute parotitis 02/02/2014  . Morbid obesity (HCC) 02/02/2014    Past Surgical History:  Procedure Laterality Date  . CESAREAN SECTION    . TONSILLECTOMY      OB History    No data available       Home Medications    Prior to Admission medications   Medication Sig Start Date End Date Taking? Authorizing Provider  amoxicillin-clavulanate (AUGMENTIN) 875-125 MG tablet Take 1 tablet by mouth every 12 (twelve) hours. 02/27/17  Yes Blue, Olivia C, PA-C  oseltamivir (TAMIFLU) 75 MG capsule Take 1 capsule (75 mg total) by mouth every 12 (twelve) hours. 02/27/17  Yes Blue, Olivia C, PA-C  acetaminophen (TYLENOL) 325 MG tablet Take 650 mg by mouth every 6 (six) hours as needed.    [provider]  acyclovir (ZOVIRAX) 400 MG tablet Take 1 tablet (400 mg total) by mouth 3 (three) times daily. 03/10/16   Janne Napoleon, NP  fluticasone (FLONASE) 50 MCG/ACT nasal spray Place 2 sprays into both nostrils daily. 03/04/17   Cathie Hoops, Meleni Delahunt V, PA-C  ipratropium (ATROVENT) 0.06 %  nasal spray Place 2 sprays into both nostrils 4 (four) times daily. 03/04/17   Cathie Hoops, Noemy Hallmon V, PA-C  predniSONE (DELTASONE) 20 MG tablet Take 2 tablets (40 mg total) by mouth daily for 4 days. 03/04/17 03/08/17  Belinda Fisher, PA-C    Family History Family History  Problem Relation Age of Onset  . Lung cancer Father   . Heart disease Maternal Grandfather     Social History Social History   Tobacco Use  . Smoking status: Never Smoker  . Smokeless tobacco: Never Used  Substance Use Topics  . Alcohol use: No  . Drug use: No     Allergies   Diphenhydramine   Review of Systems Review of Systems  Reason unable to perform ROS: See HPI as above.     Physical Exam Triage Vital Signs ED Triage Vitals [03/04/17 1412]  Enc Vitals Group     BP (!) 154/106     Pulse Rate (!) 110     Resp 16     Temp 98.8 F (37.1 C)     Temp Source Oral     SpO2 100 %     Weight 300 lb (136.1 kg)     Height 5\' 4"  (1.626 m)     Head Circumference      Peak Flow      Pain Score 5  Pain Loc      Pain Edu?      Excl. in GC?    No data found.  Updated Vital Signs BP (!) 154/106   Pulse (!) 110   Temp 98.8 F (37.1 C) (Oral)   Resp 16   Ht 5\' 4"  (1.626 m)   Wt 300 lb (136.1 kg)   LMP 02/18/2017   SpO2 100%   BMI 51.49 kg/m   Physical Exam  Constitutional: She is oriented to person, place, and time. She appears well-developed and well-nourished. No distress.  HENT:  Head: Normocephalic and atraumatic.  Right Ear: Tympanic membrane, external ear and ear canal normal. Tympanic membrane is not erythematous and not bulging.  Left Ear: Tympanic membrane, external ear and ear canal normal. Tympanic membrane is not erythematous and not bulging.  Nose: Right sinus exhibits maxillary sinus tenderness and frontal sinus tenderness. Left sinus exhibits maxillary sinus tenderness and frontal sinus tenderness.  Mouth/Throat: Uvula is midline, oropharynx is clear and moist and mucous membranes are  normal.  Eyes: Conjunctivae are normal. Pupils are equal, round, and reactive to light.  Neck: Normal range of motion. Neck supple.  Cardiovascular: Regular rhythm and normal heart sounds. Tachycardia present. Exam reveals no gallop and no friction rub.  No murmur heard. Pulmonary/Chest: Effort normal and breath sounds normal. No stridor. She has no decreased breath sounds. She has no wheezes. She has no rhonchi. She has no rales.  Lymphadenopathy:    She has no cervical adenopathy.  Neurological: She is alert and oriented to person, place, and time.  Skin: Skin is warm and dry.  Psychiatric: She has a normal mood and affect. Her behavior is normal. Judgment normal.    UC Treatments / Results  Labs (all labs ordered are listed, but only abnormal results are displayed) Labs Reviewed - No data to display  EKG  EKG Interpretation None       Radiology No results found.  Procedures Procedures (including critical care time)  Medications Ordered in UC Medications - No data to display   Initial Impression / Assessment and Plan / UC Course  I have reviewed the triage vital signs and the nursing notes.  Pertinent labs & imaging results that were available during my care of the patient were reviewed by me and considered in my medical decision making (see chart for details).    Patient was tachycardic during triage, which slightly improved during examination at 102. Patient to continue Augmentin.  Prednisone as directed.  Flonase and Atrovent nasal spray as directed.  Other symptomatic treatment discussed.  Follow-up with ENT if symptoms not improving.  Return precautions given.  Patient expresses understanding and agrees to plan.  Final Clinical Impressions(s) / UC Diagnoses   Final diagnoses:  Acute non-recurrent pansinusitis    ED Discharge Orders        Ordered    fluticasone (FLONASE) 50 MCG/ACT nasal spray  Daily     03/04/17 1424    ipratropium (ATROVENT) 0.06 % nasal  spray  4 times daily     03/04/17 1424    predniSONE (DELTASONE) 20 MG tablet  Daily     03/04/17 1424        Belinda FisherYu, Wasyl Dornfeld V, [place] JerseyPA-C 03/04/17 1437

## 2017-03-04 NOTE — Discharge Instructions (Addendum)
Continue Augmentin as directed.  Start prednisone as directed.  Flonase and Atrovent for nasal congestion and drainage. You can use over the counter nasal saline rinse such as neti pot for nasal congestion. Keep hydrated, your urine should be clear to pale yellow in color. Tylenol/motrin for fever and pain. Monitor for any worsening of symptoms, chest pain, shortness of breath, wheezing, swelling of the throat, follow up for reevaluation.  Follow-up with ENT for further evaluation if sinus symptoms continues without improvement. Follow up with [facility] for reevaluation if you require more time off.

## 2017-03-04 NOTE — ED Triage Notes (Signed)
PT was seen here Wed. Night and treated for flu and sinus infection. PT reports sinus infection symptoms have worsened. She has taken augmentin as prescribed.

## 2017-04-06 ENCOUNTER — Ambulatory Visit (HOSPITAL_COMMUNITY)
Admission: EM | Admit: 2017-04-06 | Discharge: 2017-04-06 | Disposition: A | Discharge status: Home / Self Care | Payer: Self-pay | Attending: Family Medicine | Admitting: Family Medicine

## 2017-04-06 ENCOUNTER — Ambulatory Visit (INDEPENDENT_AMBULATORY_CARE_PROVIDER_SITE_OTHER): Payer: Self-pay

## 2017-04-06 ENCOUNTER — Encounter (HOSPITAL_COMMUNITY): Payer: Self-pay | Admitting: Emergency Medicine

## 2017-04-06 ENCOUNTER — Other Ambulatory Visit: Payer: Self-pay

## 2017-04-06 DIAGNOSIS — R69 Illness, unspecified: Secondary | ICD-10-CM

## 2017-04-06 DIAGNOSIS — J111 Influenza due to unidentified influenza virus with other respiratory manifestations: Secondary | ICD-10-CM

## 2017-04-06 DIAGNOSIS — J0100 Acute maxillary sinusitis, unspecified: Secondary | ICD-10-CM

## 2017-04-06 MED ORDER — OSELTAMIVIR PHOSPHATE 75 MG PO CAPS: 75.0000 mg | ORAL_CAPSULE | Freq: Two times a day (BID) | ORAL | 0 refills | Status: AC

## 2017-04-06 MED ORDER — HYDROCODONE-HOMATROPINE 5-1.5 MG/5ML PO SYRP: 5.0000 mL | ORAL_SOLUTION | Freq: Four times a day (QID) | ORAL | 0 refills | Status: DC | PRN

## 2017-04-06 MED ORDER — AMOXICILLIN-POT CLAVULANATE 875-125 MG PO TABS: 1.0000 | ORAL_TABLET | Freq: Two times a day (BID) | ORAL | 0 refills | Status: DC

## 2017-04-06 NOTE — ED Triage Notes (Signed)
Onset of symptoms for 2 weeks.  Symptoms worsened today. Torso pain with coughing, general body aches

## 2017-04-06 NOTE — ED Provider Notes (Signed)
[facility]   [id number] 04/06/17 Arrival Time: 1226  ASSESSMENT & PLAN:  1. Influenza-like illness   2. Acute non-recurrent maxillary sinusitis    Meds ordered this encounter  Medications  . HYDROcodone-homatropine (HYCODAN) 5-1.5 MG/5ML syrup    Sig: Take 5 mLs by mouth every 6 (six) hours as needed for cough.    Dispense:  90 mL    Refill:  0  . oseltamivir (TAMIFLU) 75 MG capsule    Sig: Take 1 capsule (75 mg total) by mouth 2 (two) times daily for 5 days.    Dispense:  10 capsule    Refill:  0  . amoxicillin-clavulanate (AUGMENTIN) 875-125 MG tablet    Sig: Take 1 tablet by mouth every 12 (twelve) hours.    Dispense:  20 tablet    Refill:  0   Work note given. Cough medication sedation precautions. Discussed typical duration of symptoms. OTC symptom care as needed. Ensure adequate fluid intake and rest. May f/u with PCP or here as needed.  Reviewed expectations re: course of current medical issues. Questions answered. Outlined signs and symptoms indicating need for more acute intervention. Patient verbalized understanding. After Visit Summary given.   SUBJECTIVE: History from: patient.  Darlene Barbarann EhlersM Herandez is a 37 y.o. female who presents with complaint of nasal congestion, post-nasal drainage, and a persistent dry cough. Onset abrupt, approximately 1 day ago. Overall fatigued with body aches. SOB: none. Wheezing: none. Fever: yes. Overall normal PO intake without n/v. Sick contacts: yes, co-workers with influenza. OTC treatment: Tylenol for fever reduction.  Also complains of bilateral sinus pressure and now pain. Onset two weeks ago. Feels in teeth. H/O infrequent sinus infections.  Received flu shot this year: no.  Social History   Tobacco Use  Smoking Status Never Smoker  Smokeless Tobacco Never Used    ROS: As per HPI.   OBJECTIVE:  Vitals:   04/06/17 1346  BP: (!) 141/95  Pulse: (!) 123  Resp: (!) 28  Temp: (!) 101.4 F (38.6 C)    TempSrc: Oral  SpO2: 99%    Febrile. General appearance: alert; appears fatigued HEENT: nasal congestion; clear runny nose; throat irritation secondary to post-nasal drainage; bilateral maxillary sinus tenderness to palpation Neck: supple without LAD CV: recheck pulse 108; regular Lungs: unlabored respirations, symmetrical air entry; cough: moderate; no respiratory distress Skin: warm and dry Psychological: alert and cooperative; normal mood and affect  Imaging: Dg Chest 2 View  Result Date: 04/06/2017 EXAM: CHEST  2 VIEW COMPARISON:  None. FINDINGS: The heart size and mediastinal contours are within normal limits. Both lungs are clear. No pleural effusion or pneumothorax. The visualized skeletal structures are unremarkable. IMPRESSION: No active cardiopulmonary disease. Electronically Signed   By: Amie Portlandavid  Ormond M.D.   On: 04/06/2017 14:34    Allergies  Allergen Reactions  . Diphenhydramine Shortness Of Breath    Past Medical History:  Diagnosis Date  . Hypertension    Family History  Problem Relation Age of Onset  . Lung cancer Father   . Heart disease Maternal Grandfather    Social History   Socioeconomic History  . Marital status: Single    Spouse name: Not on file  . Number of children: 1  . Years of education: 612  . Highest education level: Not on file  Social Needs  . Financial resource strain: Not on file  . Food insecurity - worry: Not on file  . Food insecurity - inability: Not on file  . Transportation  needs - medical: Not on file  . Transportation needs - non-medical: Not on file  Occupational History  . Occupation: Child Care  Tobacco Use  . Smoking status: Never Smoker  . Smokeless tobacco: Never Used  Substance and Sexual Activity  . Alcohol use: No  . Drug use: No  . Sexual activity: Not on file  Other Topics Concern  . Not on file  Social History Narrative   Born and raised in [place], [place]. Currently resides in a house with her daughter. No  pets. Fun: Shopping, swimming, bowling, anything outdoors.   Denies religious beliefs that would effect health care.            Mardella Layman, MD 04/06/17 [phone]

## 2017-04-06 NOTE — Discharge Instructions (Signed)

## 2018-07-08 ENCOUNTER — Ambulatory Visit (HOSPITAL_COMMUNITY)
Admission: EM | Admit: 2018-07-08 | Discharge: 2018-07-08 | Disposition: A | Discharge status: Home / Self Care | Payer: Self-pay | Attending: Family Medicine | Admitting: Family Medicine

## 2018-07-08 ENCOUNTER — Other Ambulatory Visit: Payer: Self-pay

## 2018-07-08 ENCOUNTER — Encounter (HOSPITAL_COMMUNITY): Payer: Self-pay

## 2018-07-08 DIAGNOSIS — L03114 Cellulitis of left upper limb: Secondary | ICD-10-CM

## 2018-07-08 MED ORDER — CEFTRIAXONE SODIUM 1 G IJ SOLR
INTRAMUSCULAR | Status: AC
  Filled 2018-07-08: qty 10

## 2018-07-08 MED ORDER — HYDROCODONE-ACETAMINOPHEN 5-325 MG PO TABS
ORAL_TABLET | ORAL | Status: AC
  Filled 2018-07-08: qty 2

## 2018-07-08 MED ORDER — CEFTRIAXONE SODIUM 1 G IJ SOLR
1.0000 g | Freq: Once | INTRAMUSCULAR | Status: AC
  Administered 2018-07-08: 20:00:00 1 g via INTRAMUSCULAR

## 2018-07-08 MED ORDER — PREDNISONE 20 MG PO TABS: ORAL_TABLET | ORAL | 0 refills | Status: DC

## 2018-07-08 MED ORDER — PREDNISONE 20 MG PO TABS
ORAL_TABLET | ORAL | Status: AC
  Filled 2018-07-08: qty 1

## 2018-07-08 MED ORDER — HYDROCODONE-ACETAMINOPHEN 5-325 MG PO TABS
2.0000 | ORAL_TABLET | Freq: Once | ORAL | Status: AC
  Administered 2018-07-08: 20:00:00 2 via ORAL

## 2018-07-08 MED ORDER — LIDOCAINE HCL (PF) 1 % IJ SOLN
INTRAMUSCULAR | Status: AC
  Filled 2018-07-08: qty 30

## 2018-07-08 MED ORDER — PREDNISONE 20 MG PO TABS
20.0000 mg | ORAL_TABLET | Freq: Once | ORAL | Status: AC
  Administered 2018-07-08: 20:00:00 20 mg via ORAL

## 2018-07-08 MED ORDER — DOXYCYCLINE HYCLATE 100 MG PO TABS: 100.0000 mg | ORAL_TABLET | Freq: Two times a day (BID) | ORAL | 0 refills | Status: DC

## 2018-07-08 NOTE — ED Notes (Signed)
Patient able to ambulate independently  

## 2018-07-08 NOTE — ED Provider Notes (Signed)
[facility]    CSN: [id number] Arrival date & time: 07/08/18  1924     History   Chief Complaint Chief Complaint  Patient presents with  . Insect Bite    HPI Gearline Barbarann EhlersM Cowley is a 38 y.o. female.   38 yo established patient  Patient c/o cellulitis on left arm following insect bite.  Patient had immediate pain.  She did not identify the insect.  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  She is run no fever and had no nausea or vomiting.  No shortness of breath or oral swelling.      Past Medical History:  Diagnosis Date  . Hypertension     Patient Active Problem List   Diagnosis Date Noted  . Essential hypertension 02/09/2014  . Pre-diabetes 02/05/2014  . Acute parotitis 02/02/2014  . Morbid obesity (HCC) 02/02/2014    Past Surgical History:  Procedure Laterality Date  . CESAREAN SECTION    . TONSILLECTOMY      OB History   No obstetric history on file.      Home Medications    Prior to Admission medications   Medication Sig Start Date End Date Taking? Authorizing Provider  acetaminophen (TYLENOL) 325 MG tablet Take 650 mg by mouth every 6 (six) hours as needed.    [provider]  doxycycline (VIBRA-TABS) 100 MG tablet Take 1 tablet (100 mg total) by mouth 2 (two) times daily. 07/08/18   Elvina SidleLauenstein, Moody Robben, MD  predniSONE (DELTASONE) 20 MG tablet Two daily with food 07/08/18   Elvina SidleLauenstein, Ermelinda Eckert, MD    Family History Family History  Problem Relation Age of Onset  . Lung cancer Father   . Heart disease Maternal Grandfather     Social History Social History   Tobacco Use  . Smoking status: Never Smoker  . Smokeless tobacco: Never Used  Substance Use Topics  . Alcohol use: No  . Drug use: No     Allergies   Diphenhydramine   Review of Systems Review of Systems   Physical Exam Triage Vital Signs ED Triage Vitals  Enc Vitals Group     BP    Pulse      Resp      Temp      Temp src      SpO2      Weight      Height      Head Circumference      Peak Flow      Pain Score      Pain Loc      Pain Edu?      Excl. in GC?    No data found.  Updated Vital Signs BP (!) 150/97 (BP Location: Right Arm)   Pulse (!) 130   Temp 98.7 F (37.1 C) (Oral)   Resp 18   Wt (!) 137.9 kg   LMP 06/26/2018   SpO2 98%   BMI 52.18 kg/m    Physical Exam Vitals signs and nursing note reviewed.  Constitutional:      Appearance: Normal appearance. She is obese.  Eyes:     Conjunctiva/sclera: Conjunctivae normal.  Neck:     Musculoskeletal: Normal range of motion and neck supple.  Pulmonary:     Effort: Pulmonary effort is normal.  Skin:    General: Skin is warm and dry.     Findings: Erythema present.  Neurological:  General: No focal deficit present.     Mental Status: She is alert and oriented to person, place, and time.  Psychiatric:        Mood and Affect: Mood normal.        Thought Content: Thought content normal.        UC Treatments / Results  Labs (all labs ordered are listed, but only abnormal results are displayed) Labs Reviewed - No data to display  EKG None  Radiology No results found.  Procedures Procedures (including critical care time)  Medications Ordered in UC Medications  predniSONE (DELTASONE) tablet 20 mg (has no administration in time range)  cefTRIAXone (ROCEPHIN) injection 1 g (has no administration in time range)  HYDROcodone-acetaminophen (NORCO/VICODIN) 5-325 MG per tablet 2 tablet (has no administration in time range)    Initial Impression / Assessment and Plan / UC Course  I have reviewed the triage vital signs and the nursing notes.  Pertinent labs & imaging results that were available during my care of the patient were reviewed by me and considered in my medical decision making (see chart for details).     Final Clinical Impressions(s) / UC Diagnoses   Final diagnoses:   Cellulitis of left upper extremity   Discharge Instructions   None    ED Prescriptions    Medication Sig Dispense Auth. Provider   doxycycline (VIBRA-TABS) 100 MG tablet Take 1 tablet (100 mg total) by mouth 2 (two) times daily. 14 tablet Elvina Sidle, MD   predniSONE (DELTASONE) 20 MG tablet Two daily with food 10 tablet Elvina Sidle, MD     Controlled Substance Prescriptions [place] Controlled Substance Registry consulted? Not Applicable   Elvina Sidle, MD 07/08/18 [phone]

## 2018-07-08 NOTE — ED Triage Notes (Signed)
Pt cc states she got bit by a spider or insect yesterday. Left forearm swollen and hot to the touch.

## 2018-07-09 ENCOUNTER — Other Ambulatory Visit: Payer: Self-pay

## 2018-07-09 ENCOUNTER — Encounter (HOSPITAL_COMMUNITY): Payer: Self-pay | Admitting: Family Medicine

## 2018-07-09 ENCOUNTER — Ambulatory Visit (HOSPITAL_COMMUNITY)
Admission: EM | Admit: 2018-07-09 | Discharge: 2018-07-09 | Disposition: A | Discharge status: Home / Self Care | Payer: Self-pay | Attending: Family Medicine | Admitting: Family Medicine

## 2018-07-09 DIAGNOSIS — R509 Fever, unspecified: Secondary | ICD-10-CM

## 2018-07-09 DIAGNOSIS — R5081 Fever presenting with conditions classified elsewhere: Secondary | ICD-10-CM

## 2018-07-09 DIAGNOSIS — Z5189 Encounter for other specified aftercare: Secondary | ICD-10-CM

## 2018-07-09 DIAGNOSIS — L03114 Cellulitis of left upper limb: Secondary | ICD-10-CM

## 2018-07-09 DIAGNOSIS — R110 Nausea: Secondary | ICD-10-CM

## 2018-07-09 MED ORDER — CLINDAMYCIN HCL 150 MG PO CAPS: 150.0000 mg | ORAL_CAPSULE | Freq: Three times a day (TID) | ORAL | 0 refills | Status: DC

## 2018-07-09 MED ORDER — ONDANSETRON 8 MG PO TBDP: 8.0000 mg | ORAL_TABLET | Freq: Three times a day (TID) | ORAL | 0 refills | Status: DC | PRN

## 2018-07-09 MED ORDER — HYDROCODONE-ACETAMINOPHEN 5-325 MG PO TABS: 1.0000 | ORAL_TABLET | Freq: Four times a day (QID) | ORAL | 0 refills | Status: DC | PRN

## 2018-07-09 NOTE — Discharge Instructions (Addendum)
Stop the doxycycline Just take one prednisone a day Start clindamycin three times a day  Pain medicine should also be taken with food.    If pain is not controlled, or nausea is interfering with treatment, return or go to emergency department

## 2018-07-09 NOTE — ED Triage Notes (Signed)
Fever this morning was 102.3.  Feels lik skin is ripping off of arm, has nausea, no vomiting, and feels dizzy

## 2018-07-09 NOTE — ED Provider Notes (Signed)
[facility]    CSN: [id number] Arrival date & time: 07/09/18  1543     History   Chief Complaint Chief Complaint  Patient presents with  . Wound Check    HPI Darlene Rodriguez is a 38 y.o. female.   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  She was started on prednisone and doxycycline and today feels worse, with fever, nausea and dizziness.  She was given a shot of Rocephin 1gm IM.  The pain has been her most pressing problem.  She awoke with a fever of 102 and has been nauseated all day.  She has taken prednisone in the past and it did not have adverse side effects.  She was unable to work today     Past Medical History:  Diagnosis Date  . Hypertension     Patient Active Problem List   Diagnosis Date Noted  . Essential hypertension 02/09/2014  . Pre-diabetes 02/05/2014  . Acute parotitis 02/02/2014  . Morbid obesity (HCC) 02/02/2014    Past Surgical History:  Procedure Laterality Date  . CESAREAN SECTION    . TONSILLECTOMY      OB History   No obstetric history on file.      Home Medications    Prior to Admission medications   Medication Sig Start Date End Date Taking? Authorizing Provider  acetaminophen (TYLENOL) 325 MG tablet Take 650 mg by mouth every 6 (six) hours as needed.   Yes [provider]  predniSONE (DELTASONE) 20 MG tablet Two daily with food 07/08/18  Yes Elvina Sidle, MD  clindamycin (CLEOCIN) 150 MG capsule Take 1 capsule (150 mg total) by mouth 3 (three) times daily. 07/09/18   Elvina Sidle, MD  HYDROcodone-acetaminophen (NORCO) 5-325 MG tablet Take 1 tablet by mouth every 6 (six) hours as needed for moderate pain. 07/09/18   Elvina Sidle, MD  ondansetron (ZOFRAN-ODT) 8 MG disintegrating tablet Take 1 tablet (8 mg total) by mouth every 8 (eight) hours as needed for nausea. 07/09/18   Elvina Sidle, MD    Family History Family History   Problem Relation Age of Onset  . Lung cancer Father   . Heart disease Maternal Grandfather     Social History Social History   Tobacco Use  . Smoking status: Never Smoker  . Smokeless tobacco: Never Used  Substance Use Topics  . Alcohol use: No  . Drug use: No     Allergies   Diphenhydramine   Review of Systems Review of Systems   Physical Exam Triage Vital Signs ED Triage Vitals  Enc Vitals Group     BP      Pulse      Resp      Temp      Temp src      SpO2      Weight      Height      Head Circumference      Peak Flow      Pain Score      Pain Loc      Pain Edu?      Excl. in GC?    No data found.  Updated Vital Signs BP (!) 153/106 (BP Location: Right Arm) Comment (BP Location): large cuff to right forearm  Pulse (!) 119   Temp 98.3 F (36.8 C) (Oral)   Resp (!) 28   LMP 06/26/2018   SpO2 96%  Physical Exam Vitals signs and nursing note reviewed.  Constitutional:      Appearance: Normal appearance. She is obese.  HENT:     Mouth/Throat:     Pharynx: Oropharynx is clear.  Eyes:     Conjunctiva/sclera: Conjunctivae normal.  Pulmonary:     Effort: Pulmonary effort is normal.  Musculoskeletal: Normal range of motion.        General: Swelling, tenderness and signs of injury present.  Skin:    General: Skin is warm and dry.     Findings: Erythema and rash present.     Comments: Minimal induration.  No fluctuance.  Mild swelling  Overall, looks much better today  Neurological:     General: No focal deficit present.     Mental Status: She is alert and oriented to person, place, and time.  Psychiatric:        Mood and Affect: Mood normal.        UC Treatments / Results  Labs (all labs ordered are listed, but only abnormal results are displayed) Labs Reviewed - No data to display  EKG None  Radiology No results found.  Procedures Procedures (including critical care time)  Medications Ordered in UC Medications - No data  to display  Initial Impression / Assessment and Plan / UC Course  I have reviewed the triage vital signs and the nursing notes.  Pertinent labs & imaging results that were available during my care of the patient were reviewed by me and considered in my medical decision making (see chart for details).    Final Clinical Impressions(s) / UC Diagnoses   Final diagnoses:  Cellulitis of left upper extremity  Wound check, abscess  Nausea without vomiting  Fever in other diseases     Discharge Instructions     Stop the doxycycline Just take one prednisone a day Start clindamycin three times a day  Pain medicine should also be taken with food.    If pain is not controlled, or nausea is interfering with treatment, return or go to emergency department    ED Prescriptions    Medication Sig Dispense Auth. Provider   clindamycin (CLEOCIN) 150 MG capsule Take 1 capsule (150 mg total) by mouth 3 (three) times daily. 21 capsule Elvina SidleLauenstein, Sakai Wolford, MD   ondansetron (ZOFRAN-ODT) 8 MG disintegrating tablet Take 1 tablet (8 mg total) by mouth every 8 (eight) hours as needed for nausea. 12 tablet Elvina SidleLauenstein, Sharmila Wrobleski, MD   HYDROcodone-acetaminophen (NORCO) 5-325 MG tablet Take 1 tablet by mouth every 6 (six) hours as needed for moderate pain. 12 tablet Elvina SidleLauenstein, Jori Thrall, MD     Controlled Substance Prescriptions [place] Controlled Substance Registry consulted? Not Applicable   Elvina SidleLauenstein, Cherrise Occhipinti, MD 07/09/18 [id number]

## 2019-07-20 ENCOUNTER — Encounter (HOSPITAL_COMMUNITY): Payer: Self-pay

## 2019-07-20 ENCOUNTER — Ambulatory Visit (HOSPITAL_COMMUNITY)
Admission: EM | Admit: 2019-07-20 | Discharge: 2019-07-20 | Disposition: A | Discharge status: Home / Self Care | Payer: HRSA Program | Attending: Family Medicine | Admitting: Family Medicine

## 2019-07-20 ENCOUNTER — Other Ambulatory Visit: Payer: Self-pay

## 2019-07-20 DIAGNOSIS — R030 Elevated blood-pressure reading, without diagnosis of hypertension: Secondary | ICD-10-CM | POA: Insufficient documentation

## 2019-07-20 DIAGNOSIS — U071 COVID-19: Secondary | ICD-10-CM | POA: Diagnosis not present

## 2019-07-20 DIAGNOSIS — Z1152 Encounter for screening for COVID-19: Secondary | ICD-10-CM

## 2019-07-20 DIAGNOSIS — R0981 Nasal congestion: Secondary | ICD-10-CM | POA: Insufficient documentation

## 2019-07-20 DIAGNOSIS — Z20822 Contact with and (suspected) exposure to covid-19: Secondary | ICD-10-CM | POA: Diagnosis present

## 2019-07-20 MED ORDER — ALBUTEROL SULFATE HFA 108 (90 BASE) MCG/ACT IN AERS
INHALATION_SPRAY | RESPIRATORY_TRACT | Status: AC
  Filled 2019-07-20: qty 6.7

## 2019-07-20 MED ORDER — PREDNISONE 20 MG PO TABS: 40.0000 mg | ORAL_TABLET | Freq: Every day | ORAL | 0 refills | Status: AC

## 2019-07-20 MED ORDER — PSEUDOEPH-BROMPHEN-DM 30-2-10 MG/5ML PO SYRP: 5.0000 mL | ORAL_SOLUTION | Freq: Four times a day (QID) | ORAL | 0 refills | Status: DC | PRN

## 2019-07-20 MED ORDER — ALBUTEROL SULFATE HFA 108 (90 BASE) MCG/ACT IN AERS
2.0000 | INHALATION_SPRAY | Freq: Once | RESPIRATORY_TRACT | Status: AC
  Administered 2019-07-20: 2 via RESPIRATORY_TRACT

## 2019-07-20 NOTE — ED Triage Notes (Signed)
Pt c/o sinus pressure, runny nose, productive cough w/green mucous, nasal drainage green, pressure in lungsx1wk. Pressure in lungs started last night. Pt states she noticed she was wheezing last night. Pt has non labored breathing.

## 2019-07-20 NOTE — ED Provider Notes (Signed)
[facility]    CSN: [id number] Arrival date & time: 07/20/19  1222      History   Chief Complaint Chief Complaint  Patient presents with  . sinus pressure    HPI Darlene Rodriguez is a 39 y.o. female.   HPI  Patient reports experiencing, cough, sinus congestion, headache, and with wheezing for 1 week. Denies a known hx of asthma or COPD. Denies any known sick contacts or exposures to COVID-19. She has taken a few doses of OTC antihistamines without relief of symptoms. Afebrile today. She is tachycardic, however, endorses her heart is always accelerated and she has lost her sense of taste, therefore hasn't had much appetite or hydrating well.    Past Medical History:  Diagnosis Date  . Hypertension     Patient Active Problem List   Diagnosis Date Noted  . Essential hypertension 02/09/2014  . Pre-diabetes 02/05/2014  . Acute parotitis 02/02/2014  . Morbid obesity (HCC) 02/02/2014    Past Surgical History:  Procedure Laterality Date  . CESAREAN SECTION    . TONSILLECTOMY      OB History   No obstetric history on file.      Home Medications    Prior to Admission medications   Medication Sig Start Date End Date Taking? Authorizing Provider  acetaminophen (TYLENOL) 325 MG tablet Take 650 mg by mouth every 6 (six) hours as needed.    [provider]  clindamycin (CLEOCIN) 150 MG capsule Take 1 capsule (150 mg total) by mouth 3 (three) times daily. 07/09/18   Elvina Sidle, MD  HYDROcodone-acetaminophen (NORCO) 5-325 MG tablet Take 1 tablet by mouth every 6 (six) hours as needed for moderate pain. 07/09/18   Elvina Sidle, MD  ondansetron (ZOFRAN-ODT) 8 MG disintegrating tablet Take 1 tablet (8 mg total) by mouth every 8 (eight) hours as needed for nausea. 07/09/18   Elvina Sidle, MD  predniSONE (DELTASONE) 20 MG tablet Two daily with food 07/08/18   Elvina Sidle, MD    Family History Family History  Problem Relation Age of Onset  .  Lung cancer Father   . Heart disease Maternal Grandfather     Social History Social History   Tobacco Use  . Smoking status: Never Smoker  . Smokeless tobacco: Never Used  Substance Use Topics  . Alcohol use: No  . Drug use: No     Allergies   Diphenhydramine   Review of Systems Review of Systems Pertinent negatives listed in HPI  Physical Exam Triage Vital Signs ED Triage Vitals  Enc Vitals Group     BP 07/20/19 1324 (!) 164/108     Pulse Rate 07/20/19 1324 (!) 120     Resp 07/20/19 1324 18     Temp 07/20/19 1324 98.9 F (37.2 C)     Temp Source 07/20/19 1324 Oral     SpO2 07/20/19 1324 97 %     Weight 07/20/19 1325 (!) 310 lb (140.6 kg)     Height 07/20/19 1325 5\' 4"  (1.626 m)     Head Circumference --      Peak Flow --      Pain Score 07/20/19 1325 0     Pain Loc --      Pain Edu? --      Excl. in GC? --    No data found.  Updated Vital Signs BP (!) 164/108   Pulse (!) 120   Temp 98.9 F (37.2 C) (Oral)   Resp 18  Ht 5\' 4"  (1.626 m)   Wt (!) 310 lb (140.6 kg)   SpO2 97%   BMI 53.21 kg/m   Visual Acuity Right Eye Distance:   Left Eye Distance:   Bilateral Distance:    Right Eye Near:   Left Eye Near:    Bilateral Near:     Physical Exam Constitutional:      General: She is not in acute distress.    Appearance: She is ill-appearing.  HENT:     Head: Normocephalic.     Right Ear: Tympanic membrane normal.     Left Ear: Tympanic membrane normal.     Nose: Mucosal edema and congestion present.     Right Turbinates: Enlarged.     Left Turbinates: Enlarged.  Cardiovascular:     Rate and Rhythm: Regular rhythm. Tachycardia present.     Heart sounds: Normal heart sounds.  Pulmonary:     Effort: Pulmonary effort is normal.     Breath sounds: Normal breath sounds and air entry.  Skin:    General: Skin is warm.  Neurological:     GCS: GCS eye subscore is 4. GCS verbal subscore is 5. GCS motor subscore is 6.  Psychiatric:         Attention and Perception: Attention normal.        Mood and Affect: Mood normal.        Speech: Speech normal.    UC Treatments / Results  Labs (all labs ordered are listed, but only abnormal results are displayed) Labs Reviewed - No data to display  EKG   Radiology No results found.  Procedures Procedures (including critical care time)  Medications Ordered in UC Medications - No data to display  Initial Impression / Assessment and Plan / UC Course  I have reviewed the triage vital signs and the nursing notes.  Pertinent labs & imaging results that were available during my care of the patient were reviewed by me and considered in my medical decision making (see chart for details).    1. Sinus congestion   2. Encounter for screening for COVID-19   -Prednisone 40 mg x 5 days for sinus congestion and patient reports night time wheezing. All though no wheezing present on exam today. -Encouraged patient to have a COVID-19 test today given her symptoms. COVID-19 test is pending. Isolate per CDC recommendations. -If symptoms worsen follow-up for evaluation in the ER. Blood pressure elevated during visit today. HR is elevated, on review of prior encounters,  patient has elevated heart rate. Encouraged hydration. Follow-up with PCP for further evaluation and monitor BP at home. Final Clinical Impressions(s) / UC Diagnoses   Final diagnoses:  Sinus congestion  Encounter for screening for COVID-19  Elevated blood pressure reading   Discharge Instructions   None    ED Prescriptions    Medication Sig Dispense Auth. Provider   predniSONE (DELTASONE) 20 MG tablet Take 2 tablets (40 mg total) by mouth daily for 5 days. 10 tablet Scot Jun, FNP   brompheniramine-pseudoephedrine-DM 30-2-10 MG/5ML syrup Take 5 mLs by mouth 4 (four) times daily as needed. 140 mL Scot Jun, FNP     PDMP not reviewed this encounter.   Scot Jun, FNP 07/21/19 2023

## 2019-07-21 LAB — SARS CORONAVIRUS 2 (TAT 6-24 HRS): SARS Coronavirus 2: POSITIVE — AB

## 2019-07-22 ENCOUNTER — Telehealth: Payer: Self-pay | Admitting: Unknown Physician Specialty

## 2019-07-22 ENCOUNTER — Telehealth: Payer: Self-pay | Admitting: Adult Health

## 2019-07-22 NOTE — Telephone Encounter (Signed)
Called to discuss with patient about Covid symptoms and the use of bamlanivimab, a monoclonal antibody infusion for those with mild to moderate Covid symptoms and at a high risk of hospitalization.  Pt is qualified for this infusion at the [facility] infusion center due to BMI>35 plus asthma   Message left to call back

## 2019-07-22 NOTE — Telephone Encounter (Signed)
Called patient and LMOM about her covid positivity and to discuss her eligibility to receive monoclonal antibody therapy with bamlanivimab-etesivimab.  She is eligible to receive this based on her BMI.  Call back number given.  Lillard Anes, NP

## 2019-10-03 ENCOUNTER — Emergency Department (HOSPITAL_COMMUNITY): Payer: Self-pay

## 2019-10-03 ENCOUNTER — Emergency Department (HOSPITAL_COMMUNITY)
Admission: EM | Admit: 2019-10-03 | Discharge: 2019-10-04 | Disposition: A | Discharge status: Home / Self Care | Payer: Self-pay | Attending: Emergency Medicine | Admitting: Emergency Medicine

## 2019-10-03 ENCOUNTER — Other Ambulatory Visit: Payer: Self-pay

## 2019-10-03 ENCOUNTER — Encounter (HOSPITAL_COMMUNITY): Payer: Self-pay | Admitting: Emergency Medicine

## 2019-10-03 DIAGNOSIS — Y939 Activity, unspecified: Secondary | ICD-10-CM | POA: Insufficient documentation

## 2019-10-03 DIAGNOSIS — W010XXA Fall on same level from slipping, tripping and stumbling without subsequent striking against object, initial encounter: Secondary | ICD-10-CM | POA: Insufficient documentation

## 2019-10-03 DIAGNOSIS — I10 Essential (primary) hypertension: Secondary | ICD-10-CM | POA: Insufficient documentation

## 2019-10-03 DIAGNOSIS — S6991XA Unspecified injury of right wrist, hand and finger(s), initial encounter: Secondary | ICD-10-CM | POA: Insufficient documentation

## 2019-10-03 DIAGNOSIS — Y999 Unspecified external cause status: Secondary | ICD-10-CM | POA: Insufficient documentation

## 2019-10-03 DIAGNOSIS — M79641 Pain in right hand: Secondary | ICD-10-CM

## 2019-10-03 DIAGNOSIS — Z79899 Other long term (current) drug therapy: Secondary | ICD-10-CM | POA: Insufficient documentation

## 2019-10-03 DIAGNOSIS — R7303 Prediabetes: Secondary | ICD-10-CM | POA: Insufficient documentation

## 2019-10-03 DIAGNOSIS — W19XXXA Unspecified fall, initial encounter: Secondary | ICD-10-CM

## 2019-10-03 DIAGNOSIS — Y929 Unspecified place or not applicable: Secondary | ICD-10-CM | POA: Insufficient documentation

## 2019-10-03 NOTE — ED Triage Notes (Signed)
Pt tripped over her dog and fell approx 1 hour ago.  C/o pain to R hand and R wrist.

## 2019-10-04 MED ORDER — ACETAMINOPHEN 500 MG PO TABS
1000.0000 mg | ORAL_TABLET | Freq: Once | ORAL | Status: AC
  Administered 2019-10-04: 1000 mg via ORAL
  Filled 2019-10-04: qty 2

## 2019-10-04 MED ORDER — IBUPROFEN 800 MG PO TABS
800.0000 mg | ORAL_TABLET | Freq: Once | ORAL | Status: AC
  Administered 2019-10-04: 09:00:00 800 mg via ORAL
  Filled 2019-10-04: qty 1

## 2019-10-04 MED ORDER — HYDROCODONE-ACETAMINOPHEN 5-325 MG PO TABS: 1.0000 | ORAL_TABLET | Freq: Four times a day (QID) | ORAL | 0 refills | Status: DC | PRN

## 2019-10-04 NOTE — Discharge Instructions (Addendum)
You need to be reevaluated in 2 to 3 weeks and also have a repeat x-ray performed on that right wrist. I am concerned that you could have what is called a "scaphoid fracture". Sometimes x-rays do not detect these early in the injury.  I have also given you follow-up information for [facility]. Please give them a call and schedule an appointment for follow-up.  You can always return to the emergency department with new or worsening symptoms.  I prescribed you a short course of a pain medication called Vicodin. This is a narcotic medication that also has Tylenol in it. Please take this as prescribed. Do not mix with alcohol. Do not operate a motor vehicle after taking it. This medication can be constipating so please stay hydrated.  I recommend a combination of tylenol and ibuprofen for management of your pain. You can take a low dose of both at the same time. I recommend 325 mg of Tylenol combined with 400 mg of ibuprofen. This is one regular Tylenol and two regular ibuprofen. You can take these 2-3 times for day for your pain. Please try to take these medications with a small amount of food as well to prevent upsetting your stomach.  You have also been prescribed Vicodin which has Tylenol in it as well.  Do not exceed more than 4000 mg of Tylenol in 1 day.  Please adhere to the "RICE Method" for rehabbing. This stands for "rest, ice, compress, and elevate". Please continue to perform stretches and exercise as much as your pain allows. It is important that you keep moving to help heal your injury.   It was a pleasure to meet you.

## 2019-10-04 NOTE — Progress Notes (Signed)
Orthopedic Tech Progress Note Patient Details:  Darlene Rodriguez 1980/05/21 [id number]  Ortho Devices Type of Ortho Device: Thumb velcro splint Ortho Device/Splint Location: RUE Ortho Device/Splint Interventions: Ordered, Application, Adjustment   Post Interventions Patient Tolerated: Well Instructions Provided: Care of device, Adjustment of device, Poper ambulation with device   Bohdan Macho 10/04/2019, 10:11 AM

## 2019-10-04 NOTE — ED Provider Notes (Signed)
[facility] EMERGENCY DEPARTMENT Provider Note   CSN: [id number] Arrival date & time: 10/03/19  1848     History Chief Complaint  Patient presents with  . Fall  . Hand Pain    Darlene Rodriguez is a 39 y.o. female.  HPI Patient is a 39 year old female with a history of obesity and hypertension. Patient states that yesterday afternoon she tripped over her small dog and fell on an outstretched right arm.  She reports associated wrist and hand pain as well as mild swelling in the region.  Pain worsens with palpation and movement.  She has not taken anything for symptoms.  She reports some mild tingling over the right thumb.  She denies head trauma, LOC, numbness.    Past Medical History:  Diagnosis Date  . Hypertension     Patient Active Problem List   Diagnosis Date Noted  . Essential hypertension 02/09/2014  . Pre-diabetes 02/05/2014  . Acute parotitis 02/02/2014  . Morbid obesity (HCC) 02/02/2014    Past Surgical History:  Procedure Laterality Date  . CESAREAN SECTION    . TONSILLECTOMY       OB History   No obstetric history on file.     Family History  Problem Relation Age of Onset  . Lung cancer Father   . Heart disease Maternal Grandfather     Social History   Tobacco Use  . Smoking status: Never Smoker  . Smokeless tobacco: Never Used  Substance Use Topics  . Alcohol use: No  . Drug use: No    Home Medications Prior to Admission medications   Medication Sig Start Date End Date Taking? Authorizing Provider  acetaminophen (TYLENOL) 325 MG tablet Take 650 mg by mouth every 6 (six) hours as needed.    [provider]  brompheniramine-pseudoephedrine-DM 30-2-10 MG/5ML syrup Take 5 mLs by mouth 4 (four) times daily as needed. 07/20/19   Bing Neighbors, FNP    Allergies    Diphenhydramine  Review of Systems   Review of Systems  Musculoskeletal: Positive for arthralgias, joint swelling and myalgias.  Skin: Negative  for color change and wound.  Neurological: Negative for syncope, weakness, numbness and headaches.    Physical Exam Updated Vital Signs BP (!) 152/112 (BP Location: Left Arm)   Pulse (!) 103   Temp 98.2 F (36.8 C) (Oral)   Resp 20   LMP 09/12/2019   SpO2 100%   Physical Exam Vitals and nursing note reviewed.  Constitutional:      General: She is not in acute distress.    Appearance: Normal appearance. She is not ill-appearing, toxic-appearing or diaphoretic.  HENT:     Head: Normocephalic and atraumatic.     Comments: No visible head trauma noted.    Right Ear: External ear normal.     Left Ear: External ear normal.     Nose: Nose normal.     Mouth/Throat:     Mouth: Mucous membranes are moist.     Pharynx: Oropharynx is clear. No oropharyngeal exudate or posterior oropharyngeal erythema.  Eyes:     Extraocular Movements: Extraocular movements intact.  Cardiovascular:     Rate and Rhythm: Normal rate and regular rhythm.     Pulses: Normal pulses.     Heart sounds: Normal heart sounds. No murmur heard.  No friction rub. No gallop.   Pulmonary:     Effort: Pulmonary effort is normal. No respiratory distress.     Breath sounds: Normal breath sounds.  No stridor. No wheezing, rhonchi or rales.  Abdominal:     General: Abdomen is flat.     Tenderness: There is no abdominal tenderness.  Musculoskeletal:        General: Swelling and tenderness present. Normal range of motion.     Cervical back: Normal range of motion and neck supple. No tenderness.     Comments: Moderate TTP noted along the radial aspect of the right wrist.  Positive snuffbox tenderness.  Mild TTP noted along the dorsal aspect of the MCP of the right thumb.  Unable to assess range of motion of the first and second digits of the right hand secondary to pain.  Full range of motion of digits 3 through 5 of the right hand.  2+ radial pulses noted bilaterally.  Distal sensation intact in all 5 fingers of the right  hand.  Skin:    General: Skin is warm and dry.  Neurological:     General: No focal deficit present.     Mental Status: She is alert and oriented to person, place, and time.  Psychiatric:        Mood and Affect: Mood normal.        Behavior: Behavior normal.    ED Results / Procedures / Treatments   Labs (all labs ordered are listed, but only abnormal results are displayed) Labs Reviewed - No data to display  EKG None  Radiology DG Wrist Complete Right  Result Date: 10/03/2019 CLINICAL DATA:  Status post fall. EXAM: RIGHT WRIST - COMPLETE 3+ VIEW COMPARISON:  None. FINDINGS: There is no evidence of fracture or dislocation. There is no evidence of arthropathy or other focal bone abnormality. Moderate severity diffuse soft tissue swelling is seen which may be, in part, secondary to the patient's body habitus. IMPRESSION: No acute osseous abnormality. Electronically Signed   By: Aram Candela M.D.   On: 10/03/2019 19:37   DG Hand Complete Right  Result Date: 10/03/2019 CLINICAL DATA:  Status post fall. EXAM: RIGHT HAND - COMPLETE 3+ VIEW COMPARISON:  None. FINDINGS: There is no evidence of fracture or dislocation. There is no evidence of arthropathy or other focal bone abnormality. There is moderate severity diffuse soft tissue swelling. IMPRESSION: Moderate severity diffuse soft tissue swelling without evidence of acute fracture or dislocation. Electronically Signed   By: Aram Candela M.D.   On: 10/03/2019 19:36    Procedures Procedures   Medications Ordered in ED Medications  acetaminophen (TYLENOL) tablet 1,000 mg (1,000 mg Oral Given 10/04/19 0859)  ibuprofen (ADVIL) tablet 800 mg (800 mg Oral Given 10/04/19 0859)    ED Course  I have reviewed the triage vital signs and the nursing notes.  Pertinent labs & imaging results that were available during my care of the patient were reviewed by me and considered in my medical decision making (see chart for details).      MDM Rules/Calculators/A&P                          Pt is a 39 y.o. female that presents with a history, physical exam, and ED Clinical Course as noted above.   Patient presents today with right thumb and wrist pain after a fall that occurred yesterday.  She does have positive snuffbox tenderness.  Right hand is neurovascularly intact.  X-rays of the right hand and wrist were negative for acute bony abnormalities.  We will place the patient in a thumb spica.  Patient  given Tylenol and ibuprofen for management of her pain here in the emergency department.  Will discharge with a short prescription for Vicodin for breakthrough pain.  We discussed safety regarding this medication.  We will give follow-up information for hand surgery.  Recommended that she give them a call and schedule a follow-up appointment.  Also recommended the patient follow-up in 2 to 3 weeks for reimaging of the right wrist since she is having snuffbox tenderness at this visit.  We discussed the RICE method in length.  Patient is hemodynamically stable and in NAD at the time of d/c. Evaluation does not show pathology that would require ongoing emergent intervention or inpatient treatment. I explained the diagnosis to the patient. Patient is comfortable with above plan and is stable for discharge at this time. All questions were answered prior to disposition. Strict return precautions for returning to the ED were discussed. Encouraged follow up with PCP.    An After Visit Summary was printed and given to the patient.  Patient discharged to home/self care.  Condition at discharge: Stable  Note: Portions of this report may have been transcribed using voice recognition software. Every effort was made to ensure accuracy; however, inadvertent computerized transcription errors may be present.   Final Clinical Impression(s) / ED Diagnoses Final diagnoses:  Fall, initial encounter  Right hand pain  Injury of right wrist, initial  encounter   Rx / DC Orders ED Discharge Orders         Ordered    HYDROcodone-acetaminophen (NORCO/VICODIN) 5-325 MG tablet  Every 6 hours PRN        10/04/19 0914           Placido Sou, PA-C 10/04/19 7939    Tilden Fossa, MD 10/04/19 [phone]

## 2020-04-03 ENCOUNTER — Emergency Department (HOSPITAL_COMMUNITY)
Admission: EM | Admit: 2020-04-03 | Discharge: 2020-04-03 | Disposition: A | Discharge status: Home / Self Care | Payer: Self-pay | Attending: Emergency Medicine | Admitting: Emergency Medicine

## 2020-04-03 ENCOUNTER — Encounter (HOSPITAL_COMMUNITY): Payer: Self-pay | Admitting: Emergency Medicine

## 2020-04-03 ENCOUNTER — Emergency Department (HOSPITAL_COMMUNITY): Payer: Self-pay

## 2020-04-03 ENCOUNTER — Other Ambulatory Visit: Payer: Self-pay

## 2020-04-03 DIAGNOSIS — R739 Hyperglycemia, unspecified: Secondary | ICD-10-CM | POA: Insufficient documentation

## 2020-04-03 DIAGNOSIS — R1033 Periumbilical pain: Secondary | ICD-10-CM

## 2020-04-03 DIAGNOSIS — I10 Essential (primary) hypertension: Secondary | ICD-10-CM | POA: Insufficient documentation

## 2020-04-03 DIAGNOSIS — K439 Ventral hernia without obstruction or gangrene: Secondary | ICD-10-CM | POA: Insufficient documentation

## 2020-04-03 DIAGNOSIS — R000 Tachycardia, unspecified: Secondary | ICD-10-CM | POA: Insufficient documentation

## 2020-04-03 LAB — URINALYSIS, ROUTINE W REFLEX MICROSCOPIC
Bacteria, UA: NONE SEEN
Bilirubin Urine: NEGATIVE
Glucose, UA: >=500 mg/dL — AB
Hgb urine dipstick: NEGATIVE
Ketones, ur: NEGATIVE mg/dL
Leukocytes,Ua: NEGATIVE
Nitrite: NEGATIVE
Protein, ur: NEGATIVE mg/dL
Specific Gravity, Urine: 1.022 (ref 1.005–1.030)
pH: 6 (ref 5.0–8.0)

## 2020-04-03 LAB — CBC
HCT: 40 % (ref 36.0–46.0)
Hemoglobin: 13.6 g/dL (ref 12.0–15.0)
MCH: 31 pg (ref 26.0–34.0)
MCHC: 34 g/dL (ref 30.0–36.0)
MCV: 91.1 fL (ref 80.0–100.0)
Platelets: 322 10*3/uL (ref 150–400)
RBC: 4.39 MIL/uL (ref 3.87–5.11)
RDW: 13.1 % (ref 11.5–15.5)
WBC: 11.9 10*3/uL — ABNORMAL HIGH (ref 4.0–10.5)
nRBC: 0 % (ref 0.0–0.2)

## 2020-04-03 LAB — COMPREHENSIVE METABOLIC PANEL
ALT: 20 U/L (ref 0–44)
AST: 18 U/L (ref 15–41)
Albumin: 3.3 g/dL — ABNORMAL LOW (ref 3.5–5.0)
Alkaline Phosphatase: 107 U/L (ref 38–126)
Anion gap: 12 (ref 5–15)
BUN: 7 mg/dL (ref 6–20)
CO2: 24 mmol/L (ref 22–32)
Calcium: 9 mg/dL (ref 8.9–10.3)
Chloride: 99 mmol/L (ref 98–111)
Creatinine, Ser: 0.77 mg/dL (ref 0.44–1.00)
GFR, Estimated: >60 mL/min (ref >60)
Glucose, Bld: 325 mg/dL — ABNORMAL HIGH (ref 70–99)
Potassium: 3.8 mmol/L (ref 3.5–5.1)
Sodium: 135 mmol/L (ref 135–145)
Total Bilirubin: 0.5 mg/dL (ref 0.3–1.2)
Total Protein: 6.6 g/dL (ref 6.5–8.1)

## 2020-04-03 LAB — HEMOGLOBIN A1C
Hgb A1c MFr Bld: 8.2 % — ABNORMAL HIGH (ref 4.8–5.6)
Mean Plasma Glucose: 188.64 mg/dL

## 2020-04-03 LAB — LIPASE, BLOOD: Lipase: 27 U/L (ref 11–51)

## 2020-04-03 LAB — POC URINE PREG, ED: Preg Test, Ur: NEGATIVE

## 2020-04-03 MED ORDER — OXYCODONE-ACETAMINOPHEN 5-325 MG PO TABS: 1.0000 | ORAL_TABLET | Freq: Four times a day (QID) | ORAL | 0 refills | Status: DC | PRN

## 2020-04-03 MED ORDER — OXYCODONE HCL 5 MG PO TABS
5.0000 mg | ORAL_TABLET | Freq: Once | ORAL | Status: AC
  Administered 2020-04-03: 5 mg via ORAL
  Filled 2020-04-03: qty 1

## 2020-04-03 MED ORDER — ONDANSETRON HCL 4 MG/2ML IJ SOLN
4.0000 mg | Freq: Once | INTRAMUSCULAR | Status: AC
  Administered 2020-04-03: 4 mg via INTRAVENOUS
  Filled 2020-04-03: qty 2

## 2020-04-03 MED ORDER — SODIUM CHLORIDE 0.9 % IV BOLUS
1000.0000 mL | Freq: Once | INTRAVENOUS | Status: AC
  Administered 2020-04-03: 1000 mL via INTRAVENOUS

## 2020-04-03 MED ORDER — IOHEXOL 300 MG/ML  SOLN
100.0000 mL | Freq: Once | INTRAMUSCULAR | Status: AC | PRN
  Administered 2020-04-03: 100 mL via INTRAVENOUS

## 2020-04-03 MED ORDER — HYDROMORPHONE HCL 1 MG/ML IJ SOLN
0.5000 mg | Freq: Once | INTRAMUSCULAR | Status: AC
  Administered 2020-04-03: 0.5 mg via INTRAVENOUS
  Filled 2020-04-03: qty 1

## 2020-04-03 MED ORDER — METFORMIN HCL 500 MG PO TABS: 500.0000 mg | ORAL_TABLET | Freq: Two times a day (BID) | ORAL | 0 refills | Status: DC

## 2020-04-03 NOTE — ED Triage Notes (Signed)
Pt c/o abdominal pain, worse with movement. Denies nausea/vomiting/diarrhea, hx hernia.

## 2020-04-03 NOTE — Discharge Instructions (Signed)
Please read and follow all provided instructions.  Your diagnoses today include:  1. Periumbilical abdominal pain   2. Ventral hernia without obstruction or gangrene   3. Hyperglycemia   4. Hypertension, unspecified type     Tests performed today include:  Blood cell counts and platelets - high blood sugar  Kidney and liver function tests  Pancreas function test (called lipase)  Urine test to look for infection  Hemoglobin A1c - high indicating diabetes  CT scan - shows hernia but no signs of blockage  Vital signs. See below for your results today.   Medications prescribed:   Oxycodone - narcotic pain medication  DO NOT drive or perform any activities that require you to be awake and alert because this medicine can make you drowsy.    Metformin - medication for high blood sugar  Take any prescribed medications only as directed.  Home care instructions:   Follow any educational materials contained in this packet.  Follow-up instructions: Please follow-up with your primary care provider in the next 2 days for further evaluation of your symptoms.    Return instructions:  SEEK IMMEDIATE MEDICAL ATTENTION IF:  The pain does not go away or becomes severe   A temperature above 101F develops   Repeated vomiting occurs (multiple episodes)   The pain becomes localized to portions of the abdomen. The right side could possibly be appendicitis. In an adult, the left lower portion of the abdomen could be colitis or diverticulitis.   Blood is being passed in stools or vomit (bright red or black tarry stools)   You develop chest pain, difficulty breathing, dizziness or fainting, or become confused, poorly responsive, or inconsolable (young children)  If you have any other emergent concerns regarding your health  Additional Information: Abdominal (belly) pain can be caused by many things. Your caregiver performed an examination and possibly ordered blood/urine tests and  imaging (CT scan, x-rays, ultrasound). Many cases can be observed and treated at home after initial evaluation in the emergency department. Even though you are being discharged home, abdominal pain can be unpredictable. Therefore, you need a repeated exam if your pain does not resolve, returns, or worsens. Most patients with abdominal pain don't have to be admitted to the hospital or have surgery, but serious problems like appendicitis and gallbladder attacks can start out as nonspecific pain. Many abdominal conditions cannot be diagnosed in one visit, so follow-up evaluations are very important.  Your vital signs today were: BP 137/71 (BP Location: Right Arm)   Pulse (!) 119   Temp 98.6 F (37 C) (Oral)   Resp 18   SpO2 97%  If your blood pressure (bp) was elevated above 135/85 this visit, please have this repeated by your doctor within one month. --------------

## 2020-04-03 NOTE — ED Provider Notes (Signed)
[facility]   CSN: [id number] Arrival date & time: 04/03/20  1635     History Chief Complaint  Patient presents with  . Abdominal Pain    Darlene Rodriguez is a 40 y.o. female.  Patient with history of hernia, history of hypertension not currently treated presents the emergency department today for abdominal pain.  Patient states that in the area of her abdominal hernia, over the past 2 weeks, she has had increasing pain.  No treatments prior to arrival.  Today the pain was very severe prompting emergency department visit.  She denies associated nausea, vomiting, or diarrhea.  Pain is worse with movement, bending over. The onset of this condition was acute. The course is worsening.Alleviating factors: none.          Past Medical History:  Diagnosis Date  . Hypertension     Patient Active Problem List   Diagnosis Date Noted  . Essential hypertension 02/09/2014  . Pre-diabetes 02/05/2014  . Acute parotitis 02/02/2014  . Morbid obesity (HCC) 02/02/2014    Past Surgical History:  Procedure Laterality Date  . CESAREAN SECTION    . TONSILLECTOMY       OB History   No obstetric history on file.     Family History  Problem Relation Age of Onset  . Lung cancer Father   . Heart disease Maternal Grandfather     Social History   Tobacco Use  . Smoking status: Never Smoker  . Smokeless tobacco: Never Used  Substance Use Topics  . Alcohol use: No  . Drug use: No    Home Medications Prior to Admission medications   Medication Sig Start Date End Date Taking? Authorizing Provider  acetaminophen (TYLENOL) 325 MG tablet Take 650 mg by mouth every 6 (six) hours as needed.    [provider]  brompheniramine-pseudoephedrine-DM 30-2-10 MG/5ML syrup Take 5 mLs by mouth 4 (four) times daily as needed. 07/20/19   Bing NeighborsHarris, Kimberly S, FNP  HYDROcodone-acetaminophen (NORCO/VICODIN) 5-325 MG tablet Take 1 tablet by  mouth every 6 (six) hours as needed. 10/04/19   Placido SouJoldersma, Logan, PA-C    Allergies    Diphenhydramine  Review of Systems   Review of Systems  Constitutional: Negative for fever.  HENT: Negative for rhinorrhea and sore throat.   Eyes: Negative for redness.  Respiratory: Negative for cough and shortness of breath.   Cardiovascular: Negative for chest pain.  Gastrointestinal: Positive for abdominal pain. Negative for diarrhea, nausea and vomiting.  Genitourinary: Negative for dysuria, frequency, hematuria and urgency.  Musculoskeletal: Negative for myalgias.  Skin: Negative for rash.  Neurological: Negative for headaches.    Physical Exam Updated Vital Signs BP (!) 212/139 (BP Location: Right Arm)   Pulse (!) 136   Temp 98.8 F (37.1 C)   Resp 18   SpO2 99%   Physical Exam Vitals and nursing note reviewed.  Constitutional:      General: She is not in acute distress.    Appearance: She is well-developed.  HENT:     Head: Normocephalic and atraumatic.     Right Ear: External ear normal.     Left Ear: External ear normal.     Nose: Nose normal.  Eyes:     Conjunctiva/sclera: Conjunctivae normal.  Cardiovascular:     Rate and Rhythm: Normal rate and regular rhythm.     Heart sounds: No murmur heard.   Pulmonary:     Effort: No respiratory distress.  Breath sounds: No wheezing, rhonchi or rales.  Abdominal:     Palpations: Abdomen is soft.     Tenderness: There is abdominal tenderness in the periumbilical area. There is no guarding or rebound. Negative signs include Murphy's sign, Rovsing's sign and McBurney's sign.     Hernia: A hernia is present. Hernia is present in the ventral area.  Musculoskeletal:     Cervical back: Normal range of motion and neck supple.     Right lower leg: No edema.     Left lower leg: No edema.  Skin:    General: Skin is warm and dry.     Findings: No rash.  Neurological:     General: No focal deficit present.     Mental Status: She  is alert. Mental status is at baseline.     Motor: No weakness.  Psychiatric:        Mood and Affect: Mood normal.     ED Results / Procedures / Treatments   Labs (all labs ordered are listed, but only abnormal results are displayed) Labs Reviewed  COMPREHENSIVE METABOLIC PANEL - Abnormal; Notable for the following components:      Result Value   Glucose, Bld 325 (*)    Albumin 3.3 (*)    All other components within normal limits  CBC - Abnormal; Notable for the following components:   WBC 11.9 (*)    All other components within normal limits  URINALYSIS, ROUTINE W REFLEX MICROSCOPIC - Abnormal; Notable for the following components:   Glucose, UA >=500 (*)    All other components within normal limits  HEMOGLOBIN A1C - Abnormal; Notable for the following components:   Hgb A1c MFr Bld 8.2 (*)    All other components within normal limits  LIPASE, BLOOD  POC URINE PREG, ED    EKG EKG Interpretation  Date/Time:  Sunday April 03 2020 16:42:00 EST Ventricular Rate:  127 PR Interval:  134 QRS Duration: 80 QT Interval:  300 QTC Calculation: 436 R Axis:   -4 Text Interpretation: Sinus tachycardia Possible Anterior infarct , age undetermined Abnormal ECG No significant change since last tracing Confirmed by Melene Plan [phone]) on 04/03/2020 7:05:18 PM   Radiology CT ABDOMEN PELVIS W CONTRAST  Result Date: 04/03/2020 CLINICAL DATA:  Acute generalized abdominal pain. EXAM: CT ABDOMEN AND PELVIS WITH CONTRAST TECHNIQUE: Multidetector CT imaging of the abdomen and pelvis was performed using the standard protocol following bolus administration of intravenous contrast. CONTRAST:  OMNIPAQUE IOHEXOL 300 MG/ML  SOLN COMPARISON:  November 04, 2006. FINDINGS: Lower chest: No acute abnormality. Hepatobiliary: No focal liver abnormality is seen. Status post cholecystectomy. No biliary dilatation. Pancreas: Unremarkable. No pancreatic ductal dilatation or surrounding inflammatory changes.  Spleen: Normal in size without focal abnormality. Adrenals/Urinary Tract: Adrenal glands are unremarkable. Kidneys are normal, without renal calculi, focal lesion, or hydronephrosis. Bladder is unremarkable. Stomach/Bowel: Stomach is within normal limits. Appendix appears normal. No evidence of bowel wall thickening, distention, or inflammatory changes. Vascular/Lymphatic: No significant vascular findings are present. No enlarged abdominal or pelvic lymph nodes. Reproductive: Uterus and bilateral adnexa are unremarkable. Other: Large infraumbilical ventral hernia is noted which contains a loop of transverse colon, but does not result in obstruction. No ascites is noted. Musculoskeletal: No acute or significant osseous findings. IMPRESSION: 1. Large infraumbilical ventral hernia is noted which contains a loop of transverse colon, but does not result in obstruction. 2. No other abnormality seen in the abdomen or pelvis. Electronically Signed   By:  Lupita Raider M.D.   On: 04/03/2020 20:56    Procedures Procedures   Medications Ordered in ED Medications  HYDROmorphone (DILAUDID) injection 0.5 mg (0.5 mg Intravenous Given 04/03/20 2028)  ondansetron (ZOFRAN) injection 4 mg (4 mg Intravenous Given 04/03/20 2027)  sodium chloride 0.9 % bolus 1,000 mL (1,000 mLs Intravenous New Bag/Given 04/03/20 2114)  iohexol (OMNIPAQUE) 300 MG/ML solution 100 mL (100 mLs Intravenous Contrast Given 04/03/20 2040)  oxyCODONE (Oxy IR/ROXICODONE) immediate release tablet 5 mg (5 mg Oral Given 04/03/20 2154)    ED Course  I have reviewed the triage vital signs and the nursing notes.  Pertinent labs & imaging results that were available during my care of the patient were reviewed by me and considered in my medical decision making (see chart for details).  Patient seen and examined.  She has a palpable hernia in the mid abdomen that feels soft, tender but seems to be reducible with firm pressure.  Work-up reviewed thus far.   Mildly elevated white blood cell count.  Elevated blood sugar in the 300s.  UA without infection.  Liver function test and lipase were normal.  She is tachycardic and hypertensive.  Normal anion gap and no signs of DKA.  Will obtain CT imaging given abnormal vital signs.  Part of the problem might be the pain that the patient is in and she will be given parenteral pain medication and Zofran.  EKG reviewed, sinus tachycardia nonischemic.  Vital signs reviewed and are as follows: BP (!) 212/139 (BP Location: Right Arm)   Pulse (!) 136   Temp 98.8 F (37.1 C)   Resp 18   SpO2 99%   9:26 PM CT performed.  Labs as noted.  Patient updated on results.  Discussed that she has a hernia that does not appear to be obstructed.  This is likely chronic.  Abdomen remains soft with tenderness in the area of the hernia.  Her pain is better controlled now however and heart rate is improved in the 1 teens and blood pressures now lower.  I had a long discussion with the patient in regards to her hemoglobin A1c and blood sugars as well as her blood pressure.  At this point, it is likely that she has diabetes and will need started on medication for this.  Plan Metformin 500 mg twice daily.  Patient does not currently have a primary care doctor.  Will give referrals to [facility] health and wellness.  If her blood pressures remain improved, I do not feel that she necessarily needs to start on blood pressure medication tonight, however we discussed need to have this closely managed and followed.  In regards to her hernia and abdominal pain, if pain is controlled and she is not vomiting, feel that she can be discharged to home.  We discussed outpatient follow-up with surgery.  Will give referral to [facility] surgery.  Patient verbalizes understanding.  11:24 PM Patient discussed with and seen by Dr. Adela Lank prior to discharge due to elevated heart rates.    Patient cleared for discharge to home with plans as above.      MDM Rules/Calculators/A&P                          Patient with abdominal pain, possible related to her ventral hernia. Vitals are stable, no fever. Labs WBC 11.9, elevated blood sugar and Hgb a1c. Imaging shows hernia, no obstruction, no other problems.  Abdomen is  soft and hernia can be reduced.  No signs of dehydration, patient is tolerating PO's. Lungs are clear and no signs suggestive of PNA. Low concern for appendicitis, cholecystitis, pancreatitis, ruptured viscus, UTI, kidney stone, aortic dissection, aortic aneurysm or other emergent abdominal etiology. Supportive therapy indicated with return if symptoms worsen.   Tachycardia: Appears chronic.  Somewhat improved with pain control and IV fluids.  Do not suspect PE or sepsis.  High blood sugar: Likely new diabetes.  Started on Metformin.  PCP referral given.  Strongly encouraged to follow-up.  Hypertension: Marked on arrival, improved with pain control.  Will need rechecked because she has had elevated blood pressures in the past per her history.  No endorgan damage suspected today.    Final Clinical Impression(s) / ED Diagnoses Final diagnoses:  Periumbilical abdominal pain  Ventral hernia without obstruction or gangrene  Hyperglycemia  Hypertension, unspecified type  Tachycardia    Rx / DC Orders ED Discharge Orders         Ordered    metFORMIN (GLUCOPHAGE) 500 MG tablet  2 times daily        04/03/20 2320    oxyCODONE-acetaminophen (PERCOCET/ROXICET) 5-325 MG tablet  Every 6 hours PRN        04/03/20 2321           Renne Crigler, PA-C 04/03/20 2327    Melene Plan, DO 04/03/20 2336    Melene Plan, DO 04/03/20 2337

## 2020-07-01 ENCOUNTER — Ambulatory Visit (HOSPITAL_COMMUNITY)
Admission: EM | Admit: 2020-07-01 | Discharge: 2020-07-01 | Disposition: A | Discharge status: Home / Self Care | Payer: Self-pay | Attending: Emergency Medicine | Admitting: Emergency Medicine

## 2020-07-01 ENCOUNTER — Other Ambulatory Visit: Payer: Self-pay

## 2020-07-01 ENCOUNTER — Encounter (HOSPITAL_COMMUNITY): Payer: Self-pay

## 2020-07-01 DIAGNOSIS — H1032 Unspecified acute conjunctivitis, left eye: Secondary | ICD-10-CM

## 2020-07-01 MED ORDER — ERYTHROMYCIN 5 MG/GM OP OINT: TOPICAL_OINTMENT | OPHTHALMIC | 0 refills | Status: DC

## 2020-07-01 NOTE — Discharge Instructions (Addendum)
Apply the erythromycin to your eye 4 times a day for the next 5-7 days.  If your symptoms have resolved by day 5, you can stop the antibiotics.    If you develop symptoms in your right eye, start the antibiotics 4 times a day for the next 5-7 days.    Clean all your pillow cases and sheets.  Change your pillow cases daily.   You can apply a warm compress to your eye as needed for comfort.    Follow up with your eye doctor if symptoms worsen or do not improve in the next few days.

## 2020-07-01 NOTE — ED Provider Notes (Signed)
[facility]    CSN: [id number] Arrival date & time: 07/01/20  5697      History   Chief Complaint Chief Complaint  Patient presents with  . Eye Problem    HPI Darlene Rodriguez is a 40 y.o. female.   Patient here for evaluation of left eye swelling, redness, and discharge that started yesterday.  Reports eye starting itching last night and then woke up with swelling and redness this am.  Reports eye was crusted shut.  Has not tried any OTC medications or treatments.  Denies any trauma, injury, or other precipitating event.  Denies any specific alleviating or aggravating factors.  Denies any fevers, chest pain, shortness of breath, N/V/D, numbness, tingling, weakness, abdominal pain, or headaches.    The history is provided by the patient.  Eye Problem Associated symptoms: discharge, itching and redness   Associated symptoms: no photophobia     Past Medical History:  Diagnosis Date  . Hypertension     Patient Active Problem List   Diagnosis Date Noted  . Essential hypertension 02/09/2014  . Pre-diabetes 02/05/2014  . Acute parotitis 02/02/2014  . Morbid obesity (HCC) 02/02/2014    Past Surgical History:  Procedure Laterality Date  . CESAREAN SECTION    . TONSILLECTOMY      OB History   No obstetric history on file.      Home Medications    Prior to Admission medications   Medication Sig Start Date End Date Taking? Authorizing Provider  erythromycin ophthalmic ointment Place a 1/2 inch ribbon of ointment into the lower eyelid. 07/01/20  Yes Ivette Loyal, NP  brompheniramine-pseudoephedrine-DM 30-2-10 MG/5ML syrup Take 5 mLs by mouth 4 (four) times daily as needed. 07/20/19   Bing Neighbors, FNP  metFORMIN (GLUCOPHAGE) 500 MG tablet Take 1 tablet (500 mg total) by mouth 2 (two) times daily. 04/03/20   Renne Crigler, PA-C  oxyCODONE-acetaminophen (PERCOCET/ROXICET) 5-325 MG tablet Take 1 tablet by mouth every 6 (six) hours as needed for severe  pain. 04/03/20   Renne Crigler, PA-C    Family History Family History  Problem Relation Age of Onset  . Lung cancer Father   . Heart disease Maternal Grandfather     Social History Social History   Tobacco Use  . Smoking status: Never Smoker  . Smokeless tobacco: Never Used  Substance Use Topics  . Alcohol use: No  . Drug use: No     Allergies   Diphenhydramine   Review of Systems Review of Systems  Eyes: Positive for discharge, redness and itching. Negative for photophobia and visual disturbance.  All other systems reviewed and are negative.    Physical Exam Triage Vital Signs ED Triage Vitals  Enc Vitals Group     BP 07/01/20 1005 (!) 155/112     Pulse Rate 07/01/20 1005 (!) 113     Resp 07/01/20 1005 (!) 22     Temp 07/01/20 1005 97.9 F (36.6 C)     Temp Source 07/01/20 1005 Oral     SpO2 07/01/20 1005 99 %     Weight --      Height --      Head Circumference --      Peak Flow --      Pain Score 07/01/20 1003 10     Pain Loc --      Pain Edu? --      Excl. in GC? --    No data found.  Updated Vital Signs  BP (!) 155/112 (BP Location: Left Wrist)   Pulse (!) 113   Temp 97.9 F (36.6 C) (Oral)   Resp (!) 22   LMP  (Within Weeks) Comment: 2 weeks  SpO2 99%   Visual Acuity Right Eye Distance:   Left Eye Distance:   Bilateral Distance:    Right Eye Near:   Left Eye Near:    Bilateral Near:     Physical Exam Vitals and nursing note reviewed.  Constitutional:      General: She is not in acute distress.    Appearance: Normal appearance. She is not ill-appearing, toxic-appearing or diaphoretic.  HENT:     Head: Normocephalic and atraumatic.  Eyes:     General: Lids are normal. Lids are everted, no foreign bodies appreciated.        Right eye: No foreign body, discharge or hordeolum.        Left eye: Discharge present.No foreign body or hordeolum.     Conjunctiva/sclera:     Left eye: Left conjunctiva is injected.  Cardiovascular:      Rate and Rhythm: Normal rate.     Pulses: Normal pulses.  Pulmonary:     Effort: Pulmonary effort is normal.  Abdominal:     General: Abdomen is flat.  Musculoskeletal:        General: Normal range of motion.     Cervical back: Normal range of motion.  Skin:    General: Skin is warm and dry.  Neurological:     General: No focal deficit present.     Mental Status: She is alert and oriented to person, place, and time.  Psychiatric:        Mood and Affect: Mood normal.      UC Treatments / Results  Labs (all labs ordered are listed, but only abnormal results are displayed) Labs Reviewed - No data to display  EKG   Radiology No results found.  Procedures Procedures (including critical care time)  Medications Ordered in UC Medications - No data to display  Initial Impression / Assessment and Plan / UC Course  I have reviewed the triage vital signs and the nursing notes.  Pertinent labs & imaging results that were available during my care of the patient were reviewed by me and considered in my medical decision making (see chart for details).    Assessment negative for red flags or concerns.  Likely conjunctivitis.  Erythromycin ointment 4 times a day for the next 5-7 days.  Wash pillow cases, wash hands regularly with soap and water, avoid touching your face and eyes, wash door handles, light switches, remotes and other objects you frequently touch Return or follow up with PCP if symptoms persists such as fever, chills, redness, swelling, eye pain, painful eye movements, vision changes, etc...  Reviewed expectations re: course of current medical issues. Questions answered. Outlined signs and symptoms indicating need for more acute intervention. Patient verbalized understanding. After Visit Summary given.  Final Clinical Impressions(s) / UC Diagnoses   Final diagnoses:  Acute bacterial conjunctivitis of left eye     Discharge Instructions     Apply the  erythromycin to your eye 4 times a day for the next 5-7 days.  If your symptoms have resolved by day 5, you can stop the antibiotics.    If you develop symptoms in your right eye, start the antibiotics 4 times a day for the next 5-7 days.    Clean all your pillow cases and sheets.  Change  your pillow cases daily.   You can apply a warm compress to your eye as needed for comfort.    Follow up with your eye doctor if symptoms worsen or do not improve in the next few days.      ED Prescriptions    Medication Sig Dispense Auth. Provider   erythromycin ophthalmic ointment  (Status: Discontinued) Place a 1/2 inch ribbon of ointment into the lower eyelid. 3.5 g Ivette Loyal, NP   erythromycin ophthalmic ointment Place a 1/2 inch ribbon of ointment into the lower eyelid. 3.5 g Ivette Loyal, NP     PDMP not reviewed this encounter.   Ivette Loyal, NP 07/01/20 1027

## 2020-07-01 NOTE — ED Triage Notes (Signed)
Pt reports crusty and itchy left eye x 1 day.

## 2020-09-05 ENCOUNTER — Ambulatory Visit (HOSPITAL_COMMUNITY)
Admission: EM | Admit: 2020-09-05 | Discharge: 2020-09-05 | Disposition: A | Discharge status: Home / Self Care | Payer: Self-pay | Attending: Internal Medicine | Admitting: Internal Medicine

## 2020-09-05 ENCOUNTER — Other Ambulatory Visit: Payer: Self-pay

## 2020-09-05 ENCOUNTER — Encounter (HOSPITAL_COMMUNITY): Payer: Self-pay

## 2020-09-05 DIAGNOSIS — R42 Dizziness and giddiness: Secondary | ICD-10-CM

## 2020-09-05 LAB — CBG MONITORING, ED: Glucose-Capillary: 162 mg/dL — ABNORMAL HIGH (ref 70–99)

## 2020-09-05 MED ORDER — METOCLOPRAMIDE HCL 5 MG/ML IJ SOLN
INTRAMUSCULAR | Status: AC
  Filled 2020-09-05: qty 2

## 2020-09-05 MED ORDER — KETOROLAC TROMETHAMINE 30 MG/ML IJ SOLN
30.0000 mg | Freq: Once | INTRAMUSCULAR | Status: AC
  Administered 2020-09-05: 30 mg via INTRAMUSCULAR

## 2020-09-05 MED ORDER — MECLIZINE HCL 25 MG PO TABS
25.0000 mg | ORAL_TABLET | Freq: Three times a day (TID) | ORAL | 0 refills | Status: AC | PRN
Start: 2020-09-05 — End: ?

## 2020-09-05 MED ORDER — GUAIFENESIN ER 600 MG PO TB12: 600.0000 mg | ORAL_TABLET | Freq: Two times a day (BID) | ORAL | 0 refills | Status: DC | PRN

## 2020-09-05 MED ORDER — IBUPROFEN 600 MG PO TABS: 600.0000 mg | ORAL_TABLET | Freq: Four times a day (QID) | ORAL | 0 refills | Status: DC | PRN

## 2020-09-05 MED ORDER — KETOROLAC TROMETHAMINE 30 MG/ML IJ SOLN
INTRAMUSCULAR | Status: AC
  Filled 2020-09-05: qty 1

## 2020-09-05 MED ORDER — METOCLOPRAMIDE HCL 5 MG/ML IJ SOLN
5.0000 mg | Freq: Once | INTRAMUSCULAR | Status: AC
  Administered 2020-09-05: 5 mg via INTRAMUSCULAR

## 2020-09-05 NOTE — Discharge Instructions (Addendum)
Please take your medications as prescribed If your symptoms are persistent or gets worse please return to urgent care or go to the emergency room to be further evaluated.

## 2020-09-05 NOTE — ED Provider Notes (Signed)
[facility]    CSN: [id number] Arrival date & time: 09/05/20  1403      History   Chief Complaint Chief Complaint  Patient presents with   Dizziness    HPI Darlene Rodriguez is a 40 y.o. female comes to the urgent care with 1 day history of dizziness and a feeling of the room spinning around her.  Patient says symptoms started abruptly and has been persistent.  This is the first episode of such an event.  Patient denies any difficulty with her speech.  No facial deviation.  No weakness in the extremities.  Patient has had a couple of falls this morning because of the spinning sensation.  She has had some nausea with no vomiting.  Patient denies any ear pain, ringing in the ears or any hearing loss.  No numbness or tingling in the extremities on the face.  Patient has some frontal headache which is of moderate severity.  Patient denies any trauma to her head.  No history of seasonal allergies.   HPI  Past Medical History:  Diagnosis Date   Hypertension     Patient Active Problem List   Diagnosis Date Noted   Essential hypertension 02/09/2014   Pre-diabetes 02/05/2014   Acute parotitis 02/02/2014   Morbid obesity (HCC) 02/02/2014    Past Surgical History:  Procedure Laterality Date   CESAREAN SECTION     TONSILLECTOMY      OB History   No obstetric history on file.      Home Medications    Prior to Admission medications   Medication Sig Start Date End Date Taking? Authorizing Provider  guaiFENesin (MUCINEX) 600 MG 12 hr tablet Take 1 tablet (600 mg total) by mouth 2 (two) times daily as needed. 09/05/20  Yes Tyrick Dunagan, Britta Mccreedy, MD  ibuprofen (ADVIL) 600 MG tablet Take 1 tablet (600 mg total) by mouth every 6 (six) hours as needed. 09/05/20  Yes Chancey Ringel, Britta Mccreedy, MD  meclizine (ANTIVERT) 25 MG tablet Take 1 tablet (25 mg total) by mouth 3 (three) times daily as needed for dizziness. 09/05/20  Yes Haruye Lainez, Britta Mccreedy, MD  brompheniramine-pseudoephedrine-DM 30-2-10  MG/5ML syrup Take 5 mLs by mouth 4 (four) times daily as needed. 07/20/19   Bing Neighbors, FNP  erythromycin ophthalmic ointment Place a 1/2 inch ribbon of ointment into the lower eyelid. 07/01/20   Ivette Loyal, NP  oxyCODONE-acetaminophen (PERCOCET/ROXICET) 5-325 MG tablet Take 1 tablet by mouth every 6 (six) hours as needed for severe pain. 04/03/20   Renne Crigler, PA-C    Family History Family History  Problem Relation Age of Onset   Lung cancer Father    Heart disease Maternal Grandfather     Social History Social History   Tobacco Use   Smoking status: Never   Smokeless tobacco: Never  Vaping Use   Vaping Use: Never used  Substance Use Topics   Alcohol use: No   Drug use: No     Allergies   Diphenhydramine   Review of Systems Review of Systems  Respiratory: Negative.    Cardiovascular: Negative.   Gastrointestinal: Negative.   Neurological:  Positive for dizziness, light-headedness and headaches. Negative for tremors, seizures, syncope, speech difficulty, weakness and numbness.    Physical Exam Triage Vital Signs ED Triage Vitals  Enc Vitals Group     BP 09/05/20 1538 (!) 156/112     Pulse Rate 09/05/20 1538 (!) 112     Resp 09/05/20 1538 20  Temp 09/05/20 1538 98.9 F (37.2 C)     Temp Source 09/05/20 1538 Oral     SpO2 09/05/20 1538 100 %     Weight --      Height --      Head Circumference --      Peak Flow --      Pain Score 09/05/20 1535 0     Pain Loc --      Pain Edu? --      Excl. in GC? --    No data found.  Updated Vital Signs BP (!) 156/112 (BP Location: Left Arm)   Pulse (!) 112   Temp 98.9 F (37.2 C) (Oral)   Resp 20   LMP 08/24/2020   SpO2 100%   Visual Acuity Right Eye Distance:   Left Eye Distance:   Bilateral Distance:    Right Eye Near:   Left Eye Near:    Bilateral Near:     Physical Exam Vitals and nursing note reviewed.  Constitutional:      General: She is not in acute distress.    Appearance: She  is not ill-appearing.  HENT:     Right Ear: Tympanic membrane normal.     Left Ear: Tympanic membrane normal.     Ears:     Comments: Left middle ear effusion. Cardiovascular:     Rate and Rhythm: Normal rate and regular rhythm.     Pulses: Normal pulses.     Heart sounds: Normal heart sounds.  Pulmonary:     Effort: Pulmonary effort is normal.     Breath sounds: Normal breath sounds.  Abdominal:     General: Bowel sounds are normal.     Palpations: Abdomen is soft.  Musculoskeletal:        General: Normal range of motion.  Skin:    General: Skin is warm.     Capillary Refill: Capillary refill takes less than 2 seconds.  Neurological:     General: No focal deficit present.     Mental Status: She is alert and oriented to person, place, and time.     Cranial Nerves: No cranial nerve deficit.     Sensory: No sensory deficit.     Motor: No weakness.     Coordination: Coordination normal.     Gait: Gait normal.     Deep Tendon Reflexes: Reflexes normal.  Psychiatric:        Mood and Affect: Mood normal.        Behavior: Behavior normal.     UC Treatments / Results  Labs (all labs ordered are listed, but only abnormal results are displayed) Labs Reviewed  CBG MONITORING, ED - Abnormal; Notable for the following components:      Result Value   Glucose-Capillary 162 (*)    All other components within normal limits    EKG   Radiology No results found.  Procedures Procedures (including critical care time)  Medications Ordered in UC Medications  ketorolac (TORADOL) 30 MG/ML injection 30 mg (30 mg Intramuscular Given 09/05/20 1623)  metoCLOPramide (REGLAN) injection 5 mg (5 mg Intramuscular Given 09/05/20 1623)    Initial Impression / Assessment and Plan / UC Course  I have reviewed the triage vital signs and the nursing notes.  Pertinent labs & imaging results that were available during my care of the patient were reviewed by me and considered in my medical decision  making (see chart for details).     1.  Acute vertigo:  Meclizine as needed for vertigo Ibuprofen as needed for headaches Toradol 30 mg IM x1 dose Reglan 5 mg IM x1 dose Patient's neuro exam is nonfocal.  No pronator drift, no facial deviation, cranial nerves are intact, power is 5/5 in all extremities. Return precautions given. Final Clinical Impressions(s) / UC Diagnoses   Final diagnoses:  Vertigo     Discharge Instructions      Please take your medications as prescribed If your symptoms are persistent or gets worse please return to urgent care or go to the emergency room to be further evaluated.   ED Prescriptions     Medication Sig Dispense Auth. Provider   meclizine (ANTIVERT) 25 MG tablet Take 1 tablet (25 mg total) by mouth 3 (three) times daily as needed for dizziness. 30 tablet Amberley Hamler, Britta Mccreedy, MD   ibuprofen (ADVIL) 600 MG tablet Take 1 tablet (600 mg total) by mouth every 6 (six) hours as needed. 30 tablet Toniya Rozar, Britta Mccreedy, MD   guaiFENesin (MUCINEX) 600 MG 12 hr tablet Take 1 tablet (600 mg total) by mouth 2 (two) times daily as needed. 20 tablet Deven Audi, Britta Mccreedy, MD      PDMP not reviewed this encounter.   Merrilee Jansky, MD 09/05/20 [phone]

## 2020-09-05 NOTE — ED Triage Notes (Addendum)
Pt c/o dizziness, nausea, unsteady, denies seeing spots at this time. At home covid-19 test negative. Bp currently elevated.

## 2020-09-06 ENCOUNTER — Telehealth (HOSPITAL_COMMUNITY): Payer: Self-pay | Admitting: Emergency Medicine

## 2020-09-06 NOTE — Telephone Encounter (Addendum)
Spoke with pt to advise RX has been received by pharmacy.

## 2020-09-06 NOTE — Telephone Encounter (Signed)
Tried contacting pt to advise RX was received by pharmacy at 0110 today. Unable to LVM due to mailbox being full.

## 2020-11-13 ENCOUNTER — Ambulatory Visit (HOSPITAL_COMMUNITY)
Admission: EM | Admit: 2020-11-13 | Discharge: 2020-11-13 | Disposition: A | Discharge status: Home / Self Care | Payer: Self-pay | Attending: Student | Admitting: Student

## 2020-11-13 ENCOUNTER — Other Ambulatory Visit: Payer: Self-pay

## 2020-11-13 ENCOUNTER — Encounter (HOSPITAL_COMMUNITY): Payer: Self-pay

## 2020-11-13 DIAGNOSIS — H66002 Acute suppurative otitis media without spontaneous rupture of ear drum, left ear: Secondary | ICD-10-CM

## 2020-11-13 MED ORDER — AMOXICILLIN 875 MG PO TABS: 875.0000 mg | ORAL_TABLET | Freq: Two times a day (BID) | ORAL | 0 refills | Status: DC

## 2020-11-13 MED ORDER — AMOXICILLIN 875 MG PO TABS: 875.0000 mg | ORAL_TABLET | Freq: Two times a day (BID) | ORAL | 0 refills | Status: AC

## 2020-11-13 NOTE — Discharge Instructions (Addendum)
-  Start the antibiotic-Amoxicillin, 1 pill every 12 hours for 7 days.  You can take this with food like with breakfast and dinner. -Tylenol for pain -Please check your blood pressure at home or at the pharmacy. If this continues to be >140/90, follow-up with your primary care provider for further blood pressure management/ medication titration. If you develop chest pain, shortness of breath, vision changes, the worst headache of your life- head straight to the ED or call 911.

## 2020-11-13 NOTE — ED Provider Notes (Signed)
[facility]    CSN: [id number] Arrival date & time: 11/13/20  1600      History   Chief Complaint Chief Complaint  Patient presents with   Ear Pain   Ear Fullness    HPI Darlene Rodriguez is a 40 y.o. female presenting with 1.5 weeks of progressively worsening viral symptoms.  Medical history obesity.  Describes viral URI that has progressed into purulent nasal congestion with facial pressure below her eyes.  Also with 2 days of left ear pain, sharp and throbbing.  Denies hearing changes, dizziness or tinnitus.  She has not monitored her temperature at home.  For significantly elevated blood pressure, she has not taken any medications for this, denies headaches, vision changes, chest pain, shortness of breath, weakness.  HPI  Past Medical History:  Diagnosis Date   Hypertension     Patient Active Problem List   Diagnosis Date Noted   Essential hypertension 02/09/2014   Pre-diabetes 02/05/2014   Acute parotitis 02/02/2014   Morbid obesity (HCC) 02/02/2014    Past Surgical History:  Procedure Laterality Date   CESAREAN SECTION     TONSILLECTOMY      OB History   No obstetric history on file.      Home Medications    Prior to Admission medications   Medication Sig Start Date End Date Taking? Authorizing Provider  amoxicillin (AMOXIL) 875 MG tablet Take 1 tablet (875 mg total) by mouth 2 (two) times daily for 7 days. 11/13/20 11/20/20  Rhys Martini, PA-C  brompheniramine-pseudoephedrine-DM 30-2-10 MG/5ML syrup Take 5 mLs by mouth 4 (four) times daily as needed. 07/20/19   Bing Neighbors, FNP  erythromycin ophthalmic ointment Place a 1/2 inch ribbon of ointment into the lower eyelid. 07/01/20   Ivette Loyal, NP  guaiFENesin (MUCINEX) 600 MG 12 hr tablet Take 1 tablet (600 mg total) by mouth 2 (two) times daily as needed. 09/05/20   Merrilee Jansky, MD  ibuprofen (ADVIL) 600 MG tablet Take 1 tablet (600 mg total) by mouth every 6 (six) hours as  needed. 09/05/20   LampteyBritta Mccreedy, MD  meclizine (ANTIVERT) 25 MG tablet Take 1 tablet (25 mg total) by mouth 3 (three) times daily as needed for dizziness. 09/05/20   Lamptey, Britta Mccreedy, MD  oxyCODONE-acetaminophen (PERCOCET/ROXICET) 5-325 MG tablet Take 1 tablet by mouth every 6 (six) hours as needed for severe pain. 04/03/20   Renne Crigler, PA-C    Family History Family History  Problem Relation Age of Onset   Lung cancer Father    Heart disease Maternal Grandfather     Social History Social History   Tobacco Use   Smoking status: Never   Smokeless tobacco: Never  Vaping Use   Vaping Use: Never used  Substance Use Topics   Alcohol use: No   Drug use: No     Allergies   Diphenhydramine   Review of Systems Review of Systems  Constitutional:  Negative for appetite change, chills and fever.  HENT:  Positive for ear pain and sinus pressure. Negative for congestion, rhinorrhea, sinus pain and sore throat.   Eyes:  Negative for redness and visual disturbance.  Respiratory:  Negative for cough, chest tightness, shortness of breath and wheezing.   Cardiovascular:  Negative for chest pain and palpitations.  Gastrointestinal:  Negative for abdominal pain, constipation, diarrhea, nausea and vomiting.  Genitourinary:  Negative for dysuria, frequency and urgency.  Musculoskeletal:  Negative for myalgias.  Neurological:  Negative  for dizziness, weakness and headaches.  Psychiatric/Behavioral:  Negative for confusion.   All other systems reviewed and are negative.   Physical Exam Triage Vital Signs ED Triage Vitals  Enc Vitals Group     BP 11/13/20 1744 (!) 184/133     Pulse Rate 11/13/20 1744 (!) 118     Resp 11/13/20 1744 16     Temp 11/13/20 1744 98.8 F (37.1 C)     Temp Source 11/13/20 1744 Oral     SpO2 11/13/20 1744 99 %     Weight --      Height --      Head Circumference --      Peak Flow --      Pain Score 11/13/20 1743 5     Pain Loc --      Pain Edu? --       Excl. in GC? --    No data found.  Updated Vital Signs BP (!) 184/133 (BP Location: Right Arm)   Pulse (!) 118   Temp 98.8 F (37.1 C) (Oral)   Resp 16   LMP 10/30/2020 (Approximate)   SpO2 99%   Visual Acuity Right Eye Distance:   Left Eye Distance:   Bilateral Distance:    Right Eye Near:   Left Eye Near:    Bilateral Near:     Physical Exam Vitals reviewed.  Constitutional:      Appearance: Normal appearance. She is obese. She is not ill-appearing.  HENT:     Head: Normocephalic and atraumatic.     Right Ear: Hearing, tympanic membrane, ear canal and external ear normal. No swelling or tenderness. No middle ear effusion. There is no impacted cerumen. No mastoid tenderness. Tympanic membrane is not injected, scarred, perforated, erythematous, retracted or bulging.     Left Ear: Hearing, ear canal and external ear normal. Tenderness present. No swelling.  No middle ear effusion. There is no impacted cerumen. No mastoid tenderness. Tympanic membrane is erythematous and bulging. Tympanic membrane is not injected, scarred, perforated or retracted.     Mouth/Throat:     Pharynx: Oropharynx is clear. No oropharyngeal exudate or posterior oropharyngeal erythema.  Cardiovascular:     Rate and Rhythm: Regular rhythm. Tachycardia present.     Heart sounds: Normal heart sounds.  Pulmonary:     Effort: Pulmonary effort is normal.     Breath sounds: Normal breath sounds.  Lymphadenopathy:     Cervical: No cervical adenopathy.  Neurological:     General: No focal deficit present.     Mental Status: She is alert and oriented to person, place, and time.  Psychiatric:        Mood and Affect: Mood normal.        Behavior: Behavior normal.        Thought Content: Thought content normal.        Judgment: Judgment normal.     UC Treatments / Results  Labs (all labs ordered are listed, but only abnormal results are displayed) Labs Reviewed - No data to  display  EKG   Radiology No results found.  Procedures Procedures (including critical care time)  Medications Ordered in UC Medications - No data to display  Initial Impression / Assessment and Plan / UC Course  I have reviewed the triage vital signs and the nursing notes.  Pertinent labs & imaging results that were available during my care of the patient were reviewed by me and considered in my medical decision making (see  chart for details).     This patient is a very pleasant 40 y.o. year old female presenting with L AOM. Today this pt is tachycardic but afebrile nontachypneic, oxygenating well on room air, no wheezes rhonchi or rales.   She is significantly tachycardic at 184/33, denies headaches, vision changes, chest pain, shortness of breath.  Monitor at home and follow-up with primary care.  Amoxicillin for AOM.   ED return precautions discussed. Patient verbalizes understanding and agreement.   Coding Level 4 for acute illness with systemic symptoms, and prescription drug management   Final Clinical Impressions(s) / UC Diagnoses   Final diagnoses:  Non-recurrent acute suppurative otitis media of left ear without spontaneous rupture of tympanic membrane     Discharge Instructions      -Start the antibiotic-Amoxicillin, 1 pill every 12 hours for 7 days.  You can take this with food like with breakfast and dinner. -Tylenol for pain -Please check your blood pressure at home or at the pharmacy. If this continues to be >140/90, follow-up with your primary care provider for further blood pressure management/ medication titration. If you develop chest pain, shortness of breath, vision changes, the worst headache of your life- head straight to the ED or call 911.     ED Prescriptions     Medication Sig Dispense Auth. Provider   amoxicillin (AMOXIL) 875 MG tablet  (Status: Discontinued) Take 1 tablet (875 mg total) by mouth 2 (two) times daily for 7 days. 14 tablet  Rhys Martini, PA-C   amoxicillin (AMOXIL) 875 MG tablet Take 1 tablet (875 mg total) by mouth 2 (two) times daily for 7 days. 14 tablet Rhys Martini, PA-C      PDMP not reviewed this encounter.   Rhys Martini, PA-C 11/13/20 [phone]

## 2020-11-13 NOTE — ED Triage Notes (Signed)
Pt presents with c/o a sinus infection that has not gotten better or gone away. States she took an at home covid test that was negative. States her L ear feels foggy and states it has been ringing. States she has been having trouble hearing.

## 2021-01-05 ENCOUNTER — Encounter (HOSPITAL_COMMUNITY): Payer: Self-pay

## 2021-01-05 ENCOUNTER — Other Ambulatory Visit: Payer: Self-pay

## 2021-01-05 ENCOUNTER — Ambulatory Visit (HOSPITAL_COMMUNITY)
Admission: EM | Admit: 2021-01-05 | Discharge: 2021-01-05 | Disposition: A | Discharge status: Home / Self Care | Payer: PRIVATE HEALTH INSURANCE

## 2021-01-05 DIAGNOSIS — J111 Influenza due to unidentified influenza virus with other respiratory manifestations: Secondary | ICD-10-CM

## 2021-01-05 MED ORDER — LOPERAMIDE HCL 2 MG PO CAPS: 2.0000 mg | ORAL_CAPSULE | Freq: Four times a day (QID) | ORAL | 0 refills | Status: DC | PRN

## 2021-01-05 MED ORDER — ONDANSETRON 4 MG PO TBDP
4.0000 mg | ORAL_TABLET | Freq: Three times a day (TID) | ORAL | 0 refills | Status: DC | PRN
Start: 2021-01-05 — End: 2022-01-18

## 2021-01-05 MED ORDER — OSELTAMIVIR PHOSPHATE 75 MG PO CAPS: 75.0000 mg | ORAL_CAPSULE | Freq: Two times a day (BID) | ORAL | 0 refills | Status: DC

## 2021-01-05 NOTE — ED Notes (Signed)
No answer in lobby.

## 2021-01-05 NOTE — ED Notes (Signed)
Called phone number listed, no answer and mailbox is full

## 2021-01-05 NOTE — ED Provider Notes (Signed)
[facility]    CSN: [id number] Arrival date & time: 01/05/21  1116      History   Chief Complaint Chief Complaint  Patient presents with   Fever   Cough   Sore Throat    HPI Oluwanifemi ZEMIRAH KRASINSKI is a 40 y.o. female.   Patient presents with fever, chills, body aches nausea with vomiting and diarrhea.  Last episode this morning.  Poor appetite, poor fluid intake.  Has being using tylenol for fever, somewhat helpful. Exposure to the flu. History of hypertension, diabetes.  Denies nasal congestion, rhinorrhea, sore throat, cough, shortness of breath, wheezing, headaches, abdominal pain   Past Medical History:  Diagnosis Date   Hypertension     Patient Active Problem List   Diagnosis Date Noted   Essential hypertension 02/09/2014   Pre-diabetes 02/05/2014   Acute parotitis 02/02/2014   Morbid obesity (HCC) 02/02/2014    Past Surgical History:  Procedure Laterality Date   CESAREAN SECTION     TONSILLECTOMY      OB History   No obstetric history on file.      Home Medications    Prior to Admission medications   Medication Sig Start Date End Date Taking? Authorizing Provider  brompheniramine-pseudoephedrine-DM 30-2-10 MG/5ML syrup Take 5 mLs by mouth 4 (four) times daily as needed. 07/20/19   Bing Neighbors, FNP  erythromycin ophthalmic ointment Place a 1/2 inch ribbon of ointment into the lower eyelid. 07/01/20   Ivette Loyal, NP  guaiFENesin (MUCINEX) 600 MG 12 hr tablet Take 1 tablet (600 mg total) by mouth 2 (two) times daily as needed. 09/05/20   Merrilee Jansky, MD  ibuprofen (ADVIL) 600 MG tablet Take 1 tablet (600 mg total) by mouth every 6 (six) hours as needed. 09/05/20   Merrilee Jansky, MD  lisinopril (ZESTRIL) 10 MG tablet Take 10 mg by mouth daily. 12/27/20   [provider]  meclizine (ANTIVERT) 25 MG tablet Take 1 tablet (25 mg total) by mouth 3 (three) times daily as needed for dizziness. 09/05/20   Merrilee Jansky, MD   metFORMIN (GLUCOPHAGE-XR) 500 MG 24 hr tablet Take 500 mg by mouth 2 (two) times daily. 12/27/20   [provider]  oxyCODONE-acetaminophen (PERCOCET/ROXICET) 5-325 MG tablet Take 1 tablet by mouth every 6 (six) hours as needed for severe pain. 04/03/20   Renne Crigler, PA-C    Family History Family History  Problem Relation Age of Onset   Lung cancer Father    Heart disease Maternal Grandfather     Social History Social History   Tobacco Use   Smoking status: Never   Smokeless tobacco: Never  Vaping Use   Vaping Use: Never used  Substance Use Topics   Alcohol use: No   Drug use: No     Allergies   Diphenhydramine   Review of Systems Review of Systems  Constitutional:  Positive for appetite change and fever. Negative for activity change, chills, diaphoresis, fatigue and unexpected weight change.  HENT:  Negative for congestion, dental problem, drooling, ear discharge, ear pain, facial swelling, hearing loss, mouth sores, nosebleeds, postnasal drip, rhinorrhea, sinus pressure, sinus pain, sneezing, sore throat, tinnitus, trouble swallowing and voice change.   Respiratory: Negative.    Gastrointestinal:  Positive for diarrhea and vomiting. Negative for abdominal distention, abdominal pain, anal bleeding, blood in stool, constipation, nausea and rectal pain.  Skin: Negative.   Neurological: Negative.     Physical Exam Triage Vital Signs ED Triage  Vitals  Enc Vitals Group     BP 01/05/21 1339 (!) 161/124     Pulse Rate 01/05/21 1339 78     Resp 01/05/21 1339 18     Temp 01/05/21 1339 98.4 F (36.9 C)     Temp Source 01/05/21 1339 Oral     SpO2 01/05/21 1339 100 %     Weight --      Height --      Head Circumference --      Peak Flow --      Pain Score 01/05/21 1340 0     Pain Loc --      Pain Edu? --      Excl. in GC? --    No data found.  Updated Vital Signs BP (!) 161/124 (BP Location: Left Arm)   Pulse 78   Temp 98.4 F (36.9 C) (Oral)    Resp 18   LMP 12/30/2020 (Approximate)   SpO2 100%   Visual Acuity Right Eye Distance:   Left Eye Distance:   Bilateral Distance:    Right Eye Near:   Left Eye Near:    Bilateral Near:     Physical Exam Constitutional:      Appearance: Normal appearance.  HENT:     Head: Normocephalic.     Right Ear: Tympanic membrane, ear canal and external ear normal.     Left Ear: Tympanic membrane, ear canal and external ear normal.     Nose: Nose normal.     Mouth/Throat:     Mouth: Mucous membranes are moist.     Pharynx: Oropharynx is clear.  Eyes:     Extraocular Movements: Extraocular movements intact.  Cardiovascular:     Rate and Rhythm: Normal rate and regular rhythm.     Pulses: Normal pulses.     Heart sounds: Normal heart sounds.  Pulmonary:     Effort: Pulmonary effort is normal.     Breath sounds: Normal breath sounds.  Abdominal:     General: Abdomen is flat. Bowel sounds are normal.     Palpations: Abdomen is soft.  Musculoskeletal:     Cervical back: Normal range of motion and neck supple.  Skin:    General: Skin is warm and dry.  Neurological:     Mental Status: She is alert and oriented to person, place, and time. Mental status is at baseline.  Psychiatric:        Mood and Affect: Mood normal.        Behavior: Behavior normal.     UC Treatments / Results  Labs (all labs ordered are listed, but only abnormal results are displayed) Labs Reviewed - No data to display  EKG   Radiology No results found.  Procedures Procedures (including critical care time)  Medications Ordered in UC Medications - No data to display  Initial Impression / Assessment and Plan / UC Course  I have reviewed the triage vital signs and the nursing notes.  Pertinent labs & imaging results that were available during my care of the patient were reviewed by me and considered in my medical decision making (see chart for details)  Influenza-like illness  1.Tamiflu 75 mg twice  daily for 5 days 2.zofran 4 mg ODT every 8 hours daily as needed 3. Imodium 2 mg 4 times daily as needed 4.Encourage patient to increase fluid intake to prevent dehydration 5.Urgent care follow-up as needed 6. Work note given Final diagnoses:  None   Discharge Instructions  None    ED Prescriptions   None    PDMP not reviewed this encounter.   Hans Eden, NP 01/05/21 1440

## 2021-01-05 NOTE — ED Notes (Signed)
Front desk reports patient just walked in building saying no one called her.  Patient is in lobby

## 2021-01-05 NOTE — Discharge Instructions (Signed)
I believe you to have the flu today as her symptoms are consistent with current presentation also due to your recent exposure  Take Tamiflu twice a day for the next 5 days  You may use Zofran every 8 hours as needed to help with nausea, placed under the tongue and allow it to dissolve, wait 30 minutes to hour before attempting to eat  You may take Imodium as needed up to 4 times a day to help reduce and slow diarrhea, please be mindful that overuse of this medication will have the opposite effect of constipation  Please increase your fluid intake using water and electrolyte replacement substances such as Gatorade or similar product in efforts to reduce dehydration  May follow-up with urgent care as needed

## 2021-01-05 NOTE — ED Triage Notes (Signed)
Pt presents to the office today for cough,congestion and diarrhea. She was expose to the flu at work.

## 2021-01-05 NOTE — ED Notes (Signed)
Called no answer on phone or lobby

## 2021-03-16 ENCOUNTER — Ambulatory Visit: Payer: Self-pay | Admitting: Family Medicine

## 2021-04-10 DIAGNOSIS — E1165 Type 2 diabetes mellitus with hyperglycemia: Secondary | ICD-10-CM | POA: Diagnosis present

## 2021-08-15 IMAGING — CT CT ABD-PELV W/ CM
2 of 5 series · 17 of 46 positions shown, 19 images · IV contrast (omnipaque)
Comparison: November 04, 2006.

CLINICAL DATA: Acute generalized abdominal pain.

EXAM:
CT ABDOMEN AND PELVIS WITH CONTRAST
TECHNIQUE: Multidetector CT imaging of the abdomen and pelvis was performed
using the standard protocol following bolus administration of
intravenous contrast.
CONTRAST:  100mL OMNIPAQUE IOHEXOL 300 MG/ML  SOLN

[Series 3: abdomen 5.0 · axial · 0.98mm/px · z∈[+826,+1256]mm · 14 of 100 slices shown, 16 images]
[im 7/100  soft-tissue]
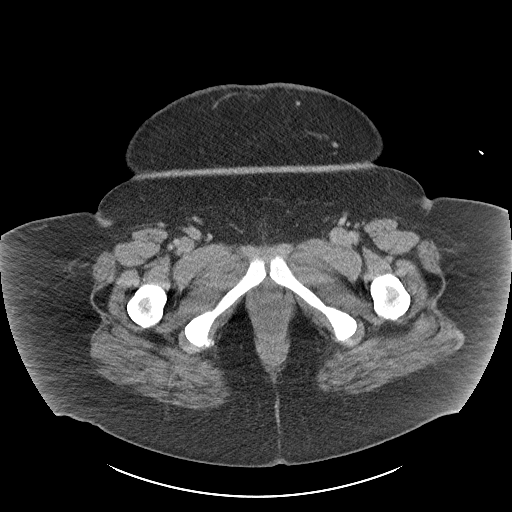
[im 7/100  bone]
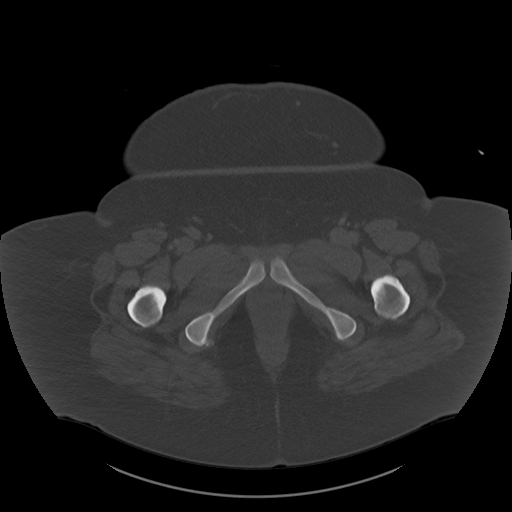
[im 14/100  soft-tissue]
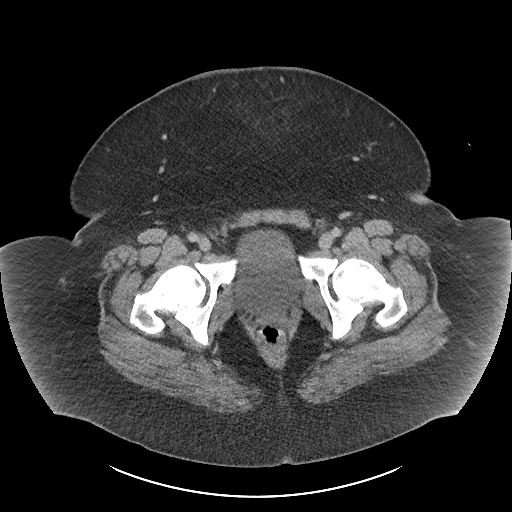
[im 20/100  soft-tissue]
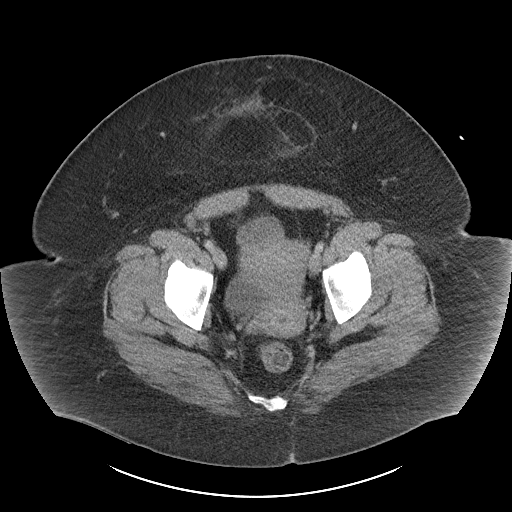
[im 27/100  soft-tissue]
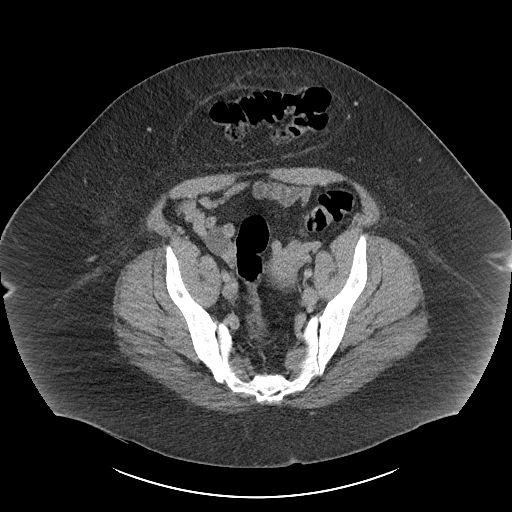
[im 34/100  soft-tissue]
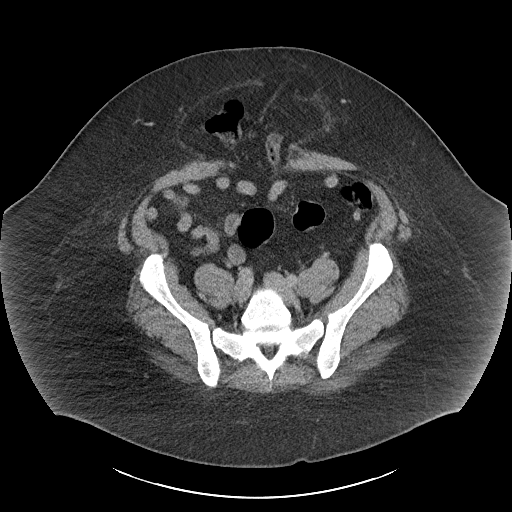
[im 40/100  soft-tissue]
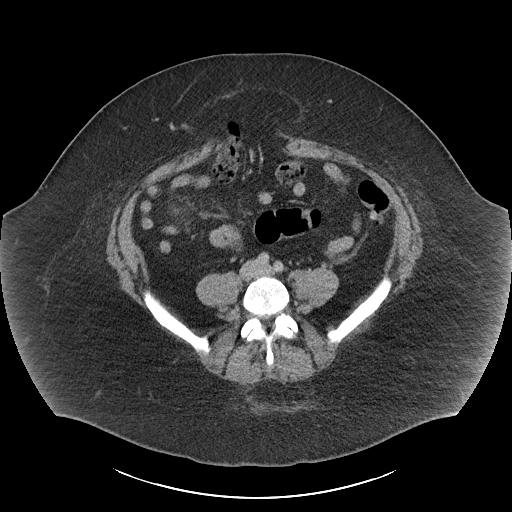
[im 47/100  soft-tissue]
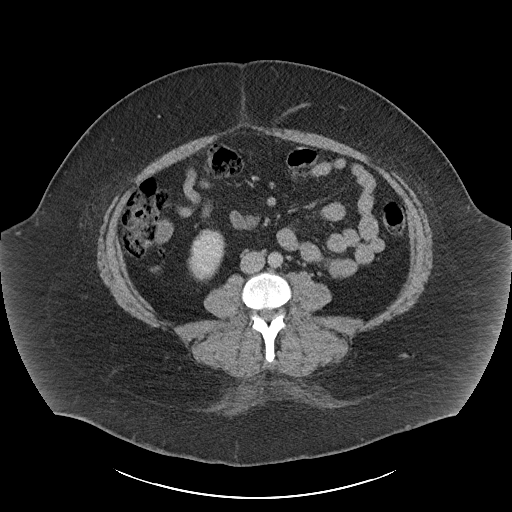
[im 53/100  soft-tissue]
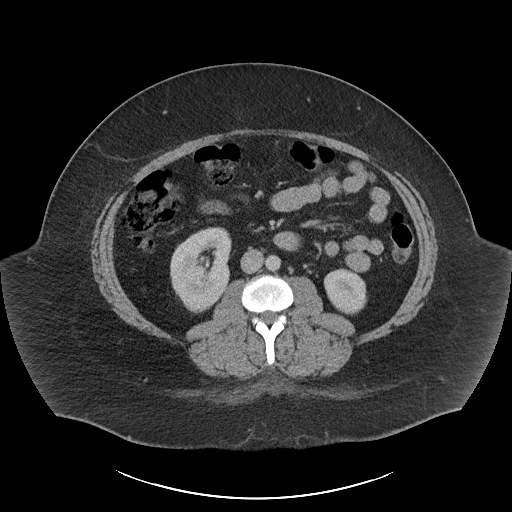
[im 60/100  soft-tissue]
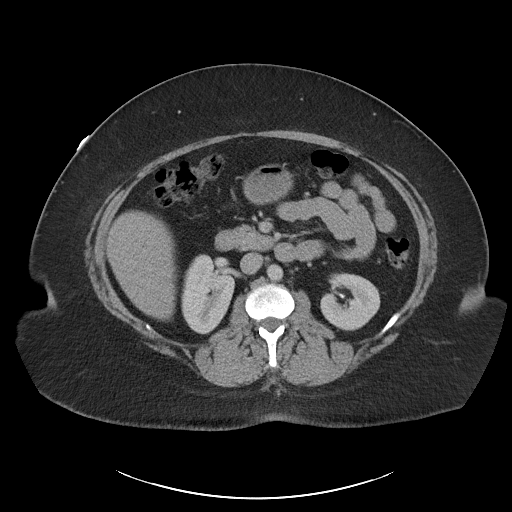
[im 60/100  bone]
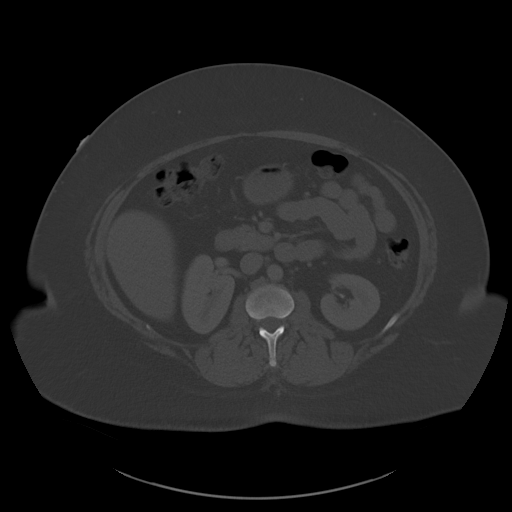
[im 67/100  soft-tissue]
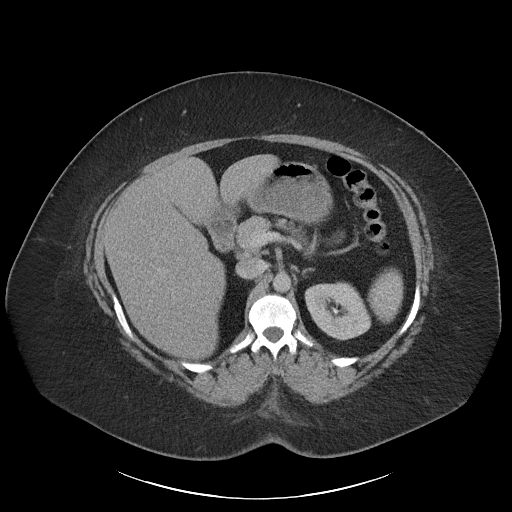
[im 73/100  soft-tissue]
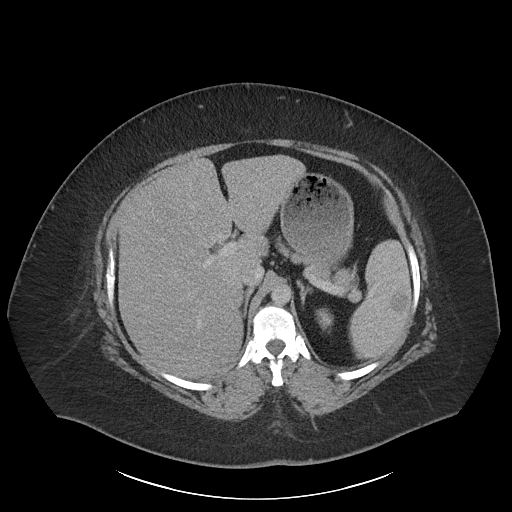
[im 80/100  soft-tissue]
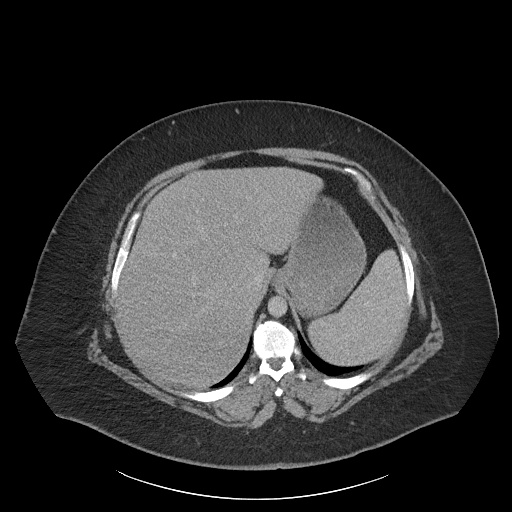
[im 86/100  soft-tissue]
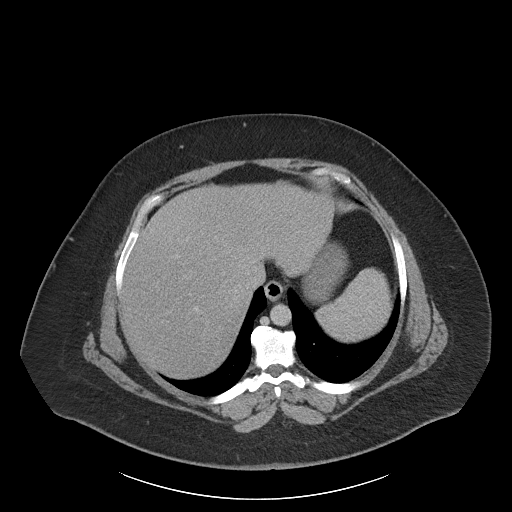
[im 93/100  soft-tissue]
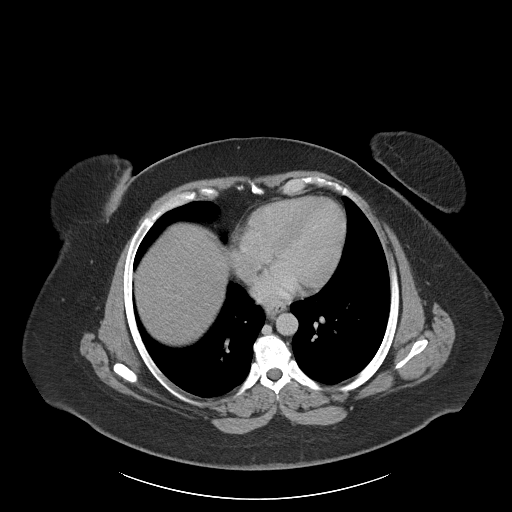

[Series 6: abdomen 3.0 mpr cor · coronal · 0.99mm/px · 3 of 138 slices shown]
[im 46/138  soft-tissue]
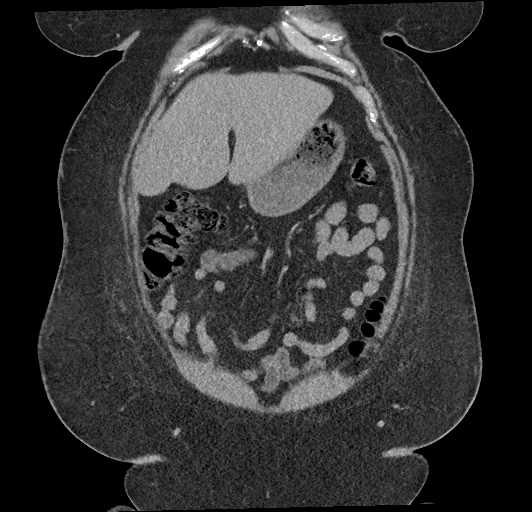
[im 61/138  soft-tissue]
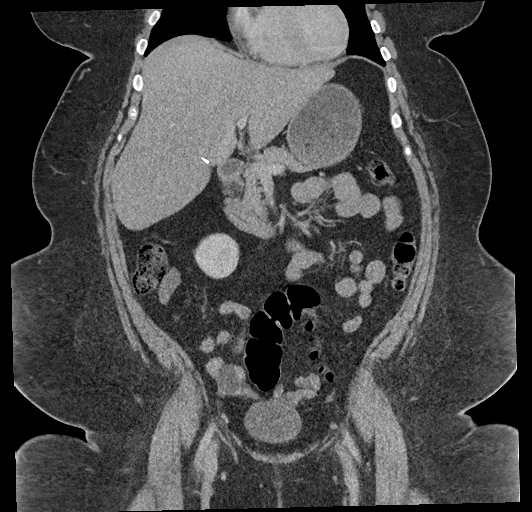
[im 77/138  soft-tissue]
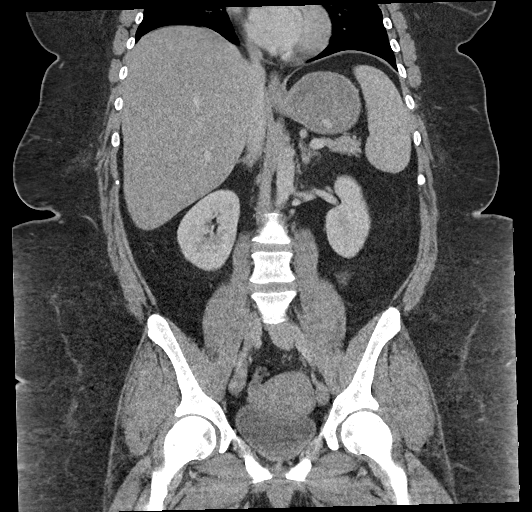

[17 of 46 positions shown; findings below may reference images not displayed]

FINDINGS: Lower chest: No acute abnormality.

Hepatobiliary: No focal liver abnormality is seen. Status post
cholecystectomy. No biliary dilatation.

Pancreas: Unremarkable. No pancreatic ductal dilatation or
surrounding inflammatory changes.

Spleen: Normal in size without focal abnormality.

Adrenals/Urinary Tract: Adrenal glands are unremarkable. Kidneys are
normal, without renal calculi, focal lesion, or hydronephrosis.
Bladder is unremarkable.

Stomach/Bowel: Stomach is within normal limits. Appendix appears
normal. No evidence of bowel wall thickening, distention, or
inflammatory changes.

Vascular/Lymphatic: No significant vascular findings are present. No
enlarged abdominal or pelvic lymph nodes.

Reproductive: Uterus and bilateral adnexa are unremarkable.

Other: Large infraumbilical ventral hernia is noted which contains a
loop of transverse colon, but does not result in obstruction. No
ascites is noted.

Musculoskeletal: No acute or significant osseous findings.
IMPRESSION: 1. Large infraumbilical ventral hernia is noted which contains a
loop of transverse colon, but does not result in obstruction.
2. No other abnormality seen in the abdomen or pelvis.

## 2021-11-06 ENCOUNTER — Ambulatory Visit (INDEPENDENT_AMBULATORY_CARE_PROVIDER_SITE_OTHER): Payer: PRIVATE HEALTH INSURANCE

## 2021-11-06 ENCOUNTER — Ambulatory Visit
Admission: EM | Admit: 2021-11-06 | Discharge: 2021-11-06 | Disposition: A | Discharge status: Home / Self Care | Payer: PRIVATE HEALTH INSURANCE | Attending: Internal Medicine | Admitting: Internal Medicine

## 2021-11-06 DIAGNOSIS — R030 Elevated blood-pressure reading, without diagnosis of hypertension: Secondary | ICD-10-CM | POA: Diagnosis not present

## 2021-11-06 DIAGNOSIS — M25561 Pain in right knee: Secondary | ICD-10-CM

## 2021-11-06 HISTORY — DX: Type 2 diabetes mellitus without complications: E11.9

## 2021-11-06 MED ORDER — PREDNISONE 20 MG PO TABS: 40.0000 mg | ORAL_TABLET | Freq: Every day | ORAL | 0 refills | Status: AC

## 2021-11-06 NOTE — Discharge Instructions (Signed)
I have prescribed you prednisone to take for inflammation in knee.  Recommend ice application as well.  Follow-up with orthopedist if pain persists or worsens.

## 2021-11-06 NOTE — ED Provider Notes (Signed)
[facility]    CSN: [id number] Arrival date & time: 11/06/21  1736      History   Chief Complaint Chief Complaint  Patient presents with   right knee pain    HPI Darlene Rodriguez is a 41 y.o. female.   Patient presents with right knee pain that started about a week ago.  Denies any obvious injury.  Denies history of chronic knee pain.  Pain is present in the anterior knee.  Denies any numbness or tingling.  Patient reports that she has taken advil and Tylenol with no improvement.  Patient also has elevated blood pressure reading.  Patient reports that she takes lisinopril and has been taking as prescribed.  She took her blood pressure this morning at home and it was 427C systolic.  Patient reports that is typically 130s to 140s at home.  Denies chest pain, shortness of breath, headache, blurred vision, dizziness, nausea, vomiting.     Past Medical History:  Diagnosis Date   Diabetes [facility])    Hypertension     Patient Active Problem List   Diagnosis Date Noted   Essential hypertension 02/09/2014   Pre-diabetes 02/05/2014   Acute parotitis 02/02/2014   Morbid obesity ([place]) 02/02/2014    Past Surgical History:  Procedure Laterality Date   CESAREAN SECTION     TONSILLECTOMY      OB History   No obstetric history on file.      Home Medications    Prior to Admission medications   Medication Sig Start Date End Date Taking? Authorizing Provider  brompheniramine-pseudoephedrine-DM 30-2-10 MG/5ML syrup Take 5 mLs by mouth 4 (four) times daily as needed. 07/20/19   Scot Jun, FNP  erythromycin ophthalmic ointment Place a 1/2 inch ribbon of ointment into the lower eyelid. 07/01/20   Pearson Forster, NP  guaiFENesin (MUCINEX) 600 MG 12 hr tablet Take 1 tablet (600 mg total) by mouth 2 (two) times daily as needed. 09/05/20   Chase Picket, MD  ibuprofen (ADVIL) 600 MG tablet Take 1 tablet (600 mg total) by mouth every 6 (six) hours as needed. 09/05/20    Chase Picket, MD  lisinopril (ZESTRIL) 10 MG tablet Take 10 mg by mouth daily. 12/27/20   [provider]  loperamide (IMODIUM) 2 MG capsule Take 1 capsule (2 mg total) by mouth 4 (four) times daily as needed for diarrhea or loose stools. 01/05/21   Hans Eden, NP  meclizine (ANTIVERT) 25 MG tablet Take 1 tablet (25 mg total) by mouth 3 (three) times daily as needed for dizziness. 09/05/20   Chase Picket, MD  metFORMIN (GLUCOPHAGE-XR) 500 MG 24 hr tablet Take 500 mg by mouth 2 (two) times daily. 12/27/20   [provider]  ondansetron (ZOFRAN-ODT) 4 MG disintegrating tablet Take 1 tablet (4 mg total) by mouth every 8 (eight) hours as needed for nausea or vomiting. 01/05/21   Hans Eden, NP  oseltamivir (TAMIFLU) 75 MG capsule Take 1 capsule (75 mg total) by mouth every 12 (twelve) hours. 01/05/21   Hans Eden, NP  oxyCODONE-acetaminophen (PERCOCET/ROXICET) 5-325 MG tablet Take 1 tablet by mouth every 6 (six) hours as needed for severe pain. 04/03/20   Carlisle Cater, PA-C  predniSONE (DELTASONE) 20 MG tablet Take 2 tablets (40 mg total) by mouth daily for 5 days. 11/06/21 11/11/21 Yes Teodora Medici, FNP    Family History Family History  Problem Relation Age of Onset   Lung cancer Father  Heart disease Maternal Grandfather     Social History Social History   Tobacco Use   Smoking status: Never   Smokeless tobacco: Never  Vaping Use   Vaping Use: Never used  Substance Use Topics   Alcohol use: No   Drug use: No     Allergies   Diphenhydramine   Review of Systems Review of Systems Per HPI  Physical Exam Triage Vital Signs ED Triage Vitals [11/06/21 1901]  Enc Vitals Group     BP (!) 185/136     Pulse Rate (!) 110     Resp 18     Temp 98 F (36.7 C)     Temp Source Oral     SpO2 98 %     Weight      Height      Head Circumference      Peak Flow      Pain Score 6     Pain Loc      Pain Edu?      Excl. in [place]?    No data  found.  Updated Vital Signs BP (!) 169/122   Pulse 100   Temp 98 F (36.7 C) (Oral)   Resp 18   SpO2 98%   Visual Acuity Right Eye Distance:   Left Eye Distance:   Bilateral Distance:    Right Eye Near:   Left Eye Near:    Bilateral Near:     Physical Exam Constitutional:      General: She is not in acute distress.    Appearance: Normal appearance. She is not toxic-appearing or diaphoretic.  HENT:     Head: Normocephalic and atraumatic.  Eyes:     Extraocular Movements: Extraocular movements intact.     Conjunctiva/sclera: Conjunctivae normal.     Pupils: Pupils are equal, round, and reactive to light.  Cardiovascular:     Rate and Rhythm: Normal rate and regular rhythm.     Pulses: Normal pulses.     Heart sounds: Normal heart sounds.  Pulmonary:     Effort: Pulmonary effort is normal. No respiratory distress.     Breath sounds: Normal breath sounds.  Musculoskeletal:     Comments: Tenderness to palpation to entirety of anterior knee.  Mild swelling noted.  No obvious discoloration, warmth, lacerations, abrasions noted.  Patient has full range of motion of knee.  Neurovascular intact.  Neurological:     General: No focal deficit present.     Mental Status: She is alert and oriented to person, place, and time. Mental status is at baseline.     Cranial Nerves: Cranial nerves 2-12 are intact.     Sensory: Sensation is intact.     Motor: Motor function is intact.     Coordination: Coordination is intact.     Gait: Gait is intact.  Psychiatric:        Mood and Affect: Mood normal.        Behavior: Behavior normal.        Thought Content: Thought content normal.        Judgment: Judgment normal.      UC Treatments / Results  Labs (all labs ordered are listed, but only abnormal results are displayed) Labs Reviewed - No data to display  EKG   Radiology DG Knee Complete 4 Views Right  Result Date: 11/06/2021 CLINICAL DATA:  Right knee pain x1 week EXAM: RIGHT  KNEE - COMPLETE 4+ VIEW COMPARISON:  None Available. FINDINGS: No fracture or  dislocation is seen. The joint spaces are preserved. The visualized soft tissues are unremarkable. No definite suprapatellar knee joint effusion. IMPRESSION: Negative. Electronically Signed   By: Julian Hy M.D.   On: 11/06/2021 19:47    Procedures Procedures (including critical care time)  Medications Ordered in UC Medications - No data to display  Initial Impression / Assessment and Plan / UC Course  I have reviewed the triage vital signs and the nursing notes.  Pertinent labs & imaging results that were available during my care of the patient were reviewed by me and considered in my medical decision making (see chart for details).     Knee x-ray was negative for any acute bony abnormality.  Unsure exact etiology of patient's inflammation of knee.  No concern for septic joint at this time.  Patient has taken NSAIDs and Tylenol with no improvement so I do think patient would benefit from prednisone.  Patient has taken prednisone before and tolerated well.  Last A1c was 6.8 a few months prior and patient reports that prednisone does not typically make her blood sugar increase, so do think this would be safe.  No other obvious contraindications to prednisone noted at this time.  Discussed ice application and supportive care as well.  Patient to follow-up with provided contact information for orthopedist if pain persists or worsens.  Patient also has elevated blood pressure but this is most likely due to pain.  Patient reports that her blood pressure this morning was [id number] systolic and that is typically baseline.  Advised patient to monitor blood pressure at home with home blood pressure cuff and follow-up if remains elevated.  Advise strict ER precautions as well.  No signs of hypertensive urgency on exam as there are no signs of endorgan damage and neuro exam is normal.  Discussed return precautions.  Patient verbalized  understanding and was agreeable with plan. Final Clinical Impressions(s) / UC Diagnoses   Final diagnoses:  Acute pain of right knee  Elevated blood pressure reading     Discharge Instructions      I have prescribed you prednisone to take for inflammation in knee.  Recommend ice application as well.  Follow-up with orthopedist if pain persists or worsens.     ED Prescriptions     Medication Sig Dispense Auth. Provider   predniSONE (DELTASONE) 20 MG tablet Take 2 tablets (40 mg total) by mouth daily for 5 days. 10 tablet Teodora Medici, [place]      PDMP not reviewed this encounter.   Teodora Medici, [place] 11/06/21 2015

## 2021-11-06 NOTE — ED Triage Notes (Signed)
Pt c/o right knee pain x 1 week denies trauma/injury to the area.

## 2021-12-09 ENCOUNTER — Other Ambulatory Visit: Payer: Self-pay

## 2021-12-09 ENCOUNTER — Encounter (HOSPITAL_COMMUNITY): Payer: Self-pay | Admitting: *Deleted

## 2021-12-09 ENCOUNTER — Ambulatory Visit (HOSPITAL_COMMUNITY)
Admission: EM | Admit: 2021-12-09 | Discharge: 2021-12-09 | Disposition: A | Discharge status: Home / Self Care | Payer: PRIVATE HEALTH INSURANCE | Attending: Family Medicine | Admitting: Family Medicine

## 2021-12-09 DIAGNOSIS — I10 Essential (primary) hypertension: Secondary | ICD-10-CM | POA: Diagnosis not present

## 2021-12-09 DIAGNOSIS — J0100 Acute maxillary sinusitis, unspecified: Secondary | ICD-10-CM | POA: Diagnosis not present

## 2021-12-09 DIAGNOSIS — J029 Acute pharyngitis, unspecified: Secondary | ICD-10-CM | POA: Diagnosis not present

## 2021-12-09 LAB — POCT RAPID STREP A, ED / UC: Streptococcus, Group A Screen (Direct): NEGATIVE

## 2021-12-09 MED ORDER — AMOXICILLIN 875 MG PO TABS
875.0000 mg | ORAL_TABLET | Freq: Two times a day (BID) | ORAL | 0 refills | Status: AC
Start: 2021-12-09 — End: 2021-12-19

## 2021-12-09 NOTE — ED Triage Notes (Signed)
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 Pt reports having a sore throat,HA and sinus infection.

## 2021-12-09 NOTE — Discharge Instructions (Addendum)
You strep test was negative today. If not allergic, you may use over the counter ibuprofen or acetaminophen as needed.  Your blood pressure was noted to be elevated during your visit today. If you are currently taking medication for high blood pressure, please ensure you are taking this as directed. If you do not have a history of high blood pressure and your blood pressure remains persistently elevated, you may need to begin taking a medication at some point. You may return here within the next few days to recheck if unable to see your primary care provider or if you do not have a one.  BP (!) 155/101   Pulse (!) 108   Temp 98.3 F (36.8 C)   Resp 20   LMP 12/02/2021   SpO2 99%   BP Readings from Last 3 Encounters:  12/09/21 (!) 155/101  11/06/21 (!) 169/122  01/05/21 (!) 161/124

## 2021-12-11 NOTE — ED Provider Notes (Signed)
[facility]   [id number] 12/09/21 Arrival Time: 1545  ASSESSMENT & PLAN:  1. Sore throat   2. Acute non-recurrent maxillary sinusitis   3. Elevated blood pressure reading in office with diagnosis of hypertension    Will treat for sinusitis. Meds ordered this encounter  Medications   amoxicillin (AMOXIL) 875 MG tablet    Sig: Take 1 tablet (875 mg total) by mouth 2 (two) times daily for 10 days.    Dispense:  20 tablet    Refill:  0   Rapid strep negative. Throat culture pending. OTC analgesics as needed.    Discharge Instructions      You strep test was negative today. If not allergic, you may use over the counter ibuprofen or acetaminophen as needed.  Your blood pressure was noted to be elevated during your visit today. If you are currently taking medication for high blood pressure, please ensure you are taking this as directed. If you do not have a history of high blood pressure and your blood pressure remains persistently elevated, you may need to begin taking a medication at some point. You may return here within the next few days to recheck if unable to see your primary care provider or if you do not have a one.  BP (!) 155/101   Pulse (!) 108   Temp 98.3 F (36.8 C)   Resp 20   LMP 12/02/2021   SpO2 99%   BP Readings from Last 3 Encounters:  12/09/21 (!) 155/101  11/06/21 (!) 169/122  01/05/21 (!) 161/124       No s/s of HTN urgency.   Follow-up Information     [facility]   Specialty: Urgent Care Why: If worsening or failing to improve as anticipated. Contact information: [address] [place] [address] [phone]                Reviewed expectations re: course of current medical issues. Questions answered. Outlined signs and symptoms indicating need for more acute intervention. Patient verbalized understanding. After Visit Summary given.   SUBJECTIVE: History from:  patient. Darlene Rodriguez is a 41 y.o. female who presents with complaint of nasal congestion, post-nasal drainage, and sinus pain. Onset gradual, a week ago. Respiratory symptoms: none. Fever: absent. Overall normal PO intake without n/v. OTC treatment: none. C0-workers with strep; does reprot ST today. Seasonal allergies: no. History of frequent sinus infections: no. No specific aggravating or alleviating factors reported.  Social History   Tobacco Use  Smoking Status Never  Smokeless Tobacco Never    OBJECTIVE:  Vitals:   12/09/21 1619  BP: (!) 155/101  Pulse: (!) 108  Resp: 20  Temp: 98.3 F (36.8 C)  SpO2: 99%    Tachycardia noted; repeat: 98.  General appearance: alert; no distress HEENT: nasal congestion; clear runny nose; throat irritation secondary to post-nasal drainage; bilateral maxillary tenderness to palpation; turbinates boggy Neck: supple without LAD; trachea midline CV: RRR Lungs: unlabored respirations, symmetrical air entry; cough: absent; no respiratory distress Skin: warm and dry Psychological: alert and cooperative; normal mood and affect  Allergies  Allergen Reactions   Diphenhydramine Shortness Of Breath    Past Medical History:  Diagnosis Date   Diabetes ([facility]CC)    Hypertension    Family History  Problem Relation Age of Onset   Lung cancer Father    Heart disease Maternal Grandfather    Social History   Socioeconomic History   Marital status:  Single    Spouse name: Not on file   Number of children: 1   Years of education: 63   Highest education level: Not on file  Occupational History   Occupation: Child Care  Tobacco Use   Smoking status: Never   Smokeless tobacco: Never  Vaping Use   Vaping Use: Never used  Substance and Sexual Activity   Alcohol use: No   Drug use: No   Sexual activity: Not on file  Other Topics Concern   Not on file  Social History Narrative   Born and raised in [place], [place]. Currently resides in a  house with her daughter. No pets. Fun: Shopping, swimming, bowling, anything outdoors.   Denies religious beliefs that would effect health care.    Social Determinants of Health   Financial Resource Strain: Not on file  Food Insecurity: Not on file  Transportation Needs: Not on file  Physical Activity: Not on file  Stress: Not on file  Social Connections: Not on file  Intimate Partner Violence: Not on file             Vanessa Kick, MD 12/11/21 [phone]

## 2021-12-12 LAB — CULTURE, GROUP A STREP (THRC)

## 2022-01-18 ENCOUNTER — Encounter (HOSPITAL_COMMUNITY): Payer: Self-pay

## 2022-01-18 ENCOUNTER — Ambulatory Visit (HOSPITAL_COMMUNITY)
Admission: EM | Admit: 2022-01-18 | Discharge: 2022-01-18 | Disposition: A | Discharge status: Home / Self Care | Payer: Self-pay | Attending: Family Medicine | Admitting: Family Medicine

## 2022-01-18 DIAGNOSIS — A084 Viral intestinal infection, unspecified: Secondary | ICD-10-CM

## 2022-01-18 MED ORDER — ONDANSETRON 4 MG PO TBDP: 4.0000 mg | ORAL_TABLET | Freq: Three times a day (TID) | ORAL | 0 refills | Status: DC | PRN

## 2022-01-18 NOTE — ED Triage Notes (Signed)
Pt reports body aches,fever and diarrhea x 2 days. Pt reports exposure to the flu. Pt took tylenol for her fever this morning.

## 2022-01-18 NOTE — Discharge Instructions (Signed)
Ondansetron dissolved in the mouth every 8 hours as needed for nausea or vomiting. Clear liquids and bland things to eat.  You can also use Imodium as needed for diarrhea if you are having very frequent stools.  If you continue to vomit despite the medication, and cannot keep fluids down, please proceed to the emergency room for further evaluation and treatment

## 2022-01-18 NOTE — ED Provider Notes (Addendum)
[facility]    CSN: [id number] Arrival date & time: 01/18/22  1034      History   Chief Complaint Chief Complaint  Patient presents with   Generalized Body Aches   Fever   Diarrhea    HPI Darlene Rodriguez is a 41 y.o. female.    Fever Associated symptoms: diarrhea   Diarrhea Associated symptoms: fever    Here for nausea, vomiting, and diarrhea.  She has also had fever.  Symptoms began overnight.  She has thrown up about 10 times since this began last night.  She also has had several loose stools, about the same amount  No blood in the stool or emesis.  She has had no cough, congestion, or rhinorrhea.  No ear pain.  Her throat is sore, but she feels that is due to throwing up.  She did take 3 home COVID test that were negative.  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  She does not know the specific exposure to anyone with flu.   Past Medical History:  Diagnosis Date   Diabetes [facility])    Hypertension     Patient Active Problem List   Diagnosis Date Noted   Essential hypertension 02/09/2014   Pre-diabetes 02/05/2014   Acute parotitis 02/02/2014   Morbid obesity ([facility]) 02/02/2014    Past Surgical History:  Procedure Laterality Date   CESAREAN SECTION     TONSILLECTOMY      OB History   No obstetric history on file.      Home Medications    Prior to Admission medications   Medication Sig Start Date End Date Taking? Authorizing Provider  ondansetron (ZOFRAN-ODT) 4 MG disintegrating tablet Take 1 tablet (4 mg total) by mouth every 8 (eight) hours as needed for nausea or vomiting. 01/18/22  Yes Majorie Santee, Janace Aris, MD  lisinopril (ZESTRIL) 10 MG tablet Take 10 mg by mouth daily. 12/27/20   [provider]  meclizine (ANTIVERT) 25 MG tablet Take 1 tablet (25 mg total) by mouth 3 (three) times daily as needed for dizziness. 09/05/20   Merrilee Jansky, MD  metFORMIN  (GLUCOPHAGE-XR) 500 MG 24 hr tablet Take 500 mg by mouth 2 (two) times daily. 12/27/20   [provider]    Family History Family History  Problem Relation Age of Onset   Lung cancer Father    Heart disease Maternal Grandfather     Social History Social History   Tobacco Use   Smoking status: Never   Smokeless tobacco: Never  Vaping Use   Vaping Use: Never used  Substance Use Topics   Alcohol use: No   Drug use: No     Allergies   Diphenhydramine   Review of Systems Review of Systems  Constitutional:  Positive for fever.  Gastrointestinal:  Positive for diarrhea.     Physical Exam Triage Vital Signs ED Triage Vitals [01/18/22 1136]  Enc Vitals Group     BP (!) 177/125     Pulse Rate 89     Resp 16     Temp 98.2 F (36.8 C)     Temp Source Oral     SpO2 98 %     Weight      Height      Head Circumference      Peak Flow      Pain Score      Pain Loc  Pain Edu?      Excl. in [place]?    No data found.  Updated Vital Signs BP (!) 177/125 (BP Location: Left Arm)   Pulse 89   Temp 98.2 F (36.8 C) (Oral)   Resp 16   SpO2 98%   Visual Acuity Right Eye Distance:   Left Eye Distance:   Bilateral Distance:    Right Eye Near:   Left Eye Near:    Bilateral Near:     Physical Exam Vitals reviewed.  Constitutional:      General: She is not in acute distress.    Appearance: She is not toxic-appearing.  HENT:     Nose: Nose normal.     Mouth/Throat:     Mouth: Mucous membranes are moist.     Pharynx: No oropharyngeal exudate.     Comments: There is mild erythema of the posterior oropharynx Eyes:     Extraocular Movements: Extraocular movements intact.     Conjunctiva/sclera: Conjunctivae normal.     Pupils: Pupils are equal, round, and reactive to light.  Cardiovascular:     Rate and Rhythm: Normal rate and regular rhythm.     Heart sounds: No murmur heard. Pulmonary:     Effort: Pulmonary effort is normal. No respiratory distress.      Breath sounds: No stridor. No wheezing, rhonchi or rales.  Abdominal:     General: There is no distension.     Palpations: Abdomen is soft.     Tenderness: There is no abdominal tenderness. There is no guarding.  Musculoskeletal:     Cervical back: Neck supple.  Lymphadenopathy:     Cervical: No cervical adenopathy.  Skin:    Capillary Refill: Capillary refill takes less than 2 seconds.     Coloration: Skin is not jaundiced or pale.  Neurological:     General: No focal deficit present.     Mental Status: She is alert and oriented to person, place, and time.  Psychiatric:        Behavior: Behavior normal.      UC Treatments / Results  Labs (all labs ordered are listed, but only abnormal results are displayed) Labs Reviewed - No data to display  EKG   Radiology No results found.  Procedures Procedures (including critical care time)  Medications Ordered in UC Medications - No data to display  Initial Impression / Assessment and Plan / UC Course  I have reviewed the triage vital signs and the nursing notes.  Pertinent labs & imaging results that were available during my care of the patient were reviewed by me and considered in my medical decision making (see chart for details).        Discussed possibly doing a flu and COVID test.  Since she has absolutely no respiratory symptoms, we are going to just treat the symptoms of her current illness which may be a simple gastroenteritis.  She declined respiratory swabs.  If she continues to vomit despite the medication that is sent, she will proceed to the emergency room for further evaluation Final Clinical Impressions(s) / UC Diagnoses   Final diagnoses:  Viral gastroenteritis     Discharge Instructions      Ondansetron dissolved in the mouth every 8 hours as needed for nausea or vomiting. Clear liquids and bland things to eat.  You can also use Imodium as needed for diarrhea if you are having very frequent  stools.  If you continue to vomit despite the medication, and cannot keep  fluids down, please proceed to the emergency room for further evaluation and treatment     ED Prescriptions     Medication Sig Dispense Auth. Provider   ondansetron (ZOFRAN-ODT) 4 MG disintegrating tablet Take 1 tablet (4 mg total) by mouth every 8 (eight) hours as needed for nausea or vomiting. 10 tablet Marlinda Mike Janace Aris, MD      PDMP not reviewed this encounter.   Zenia Resides, MD 01/18/22 1201    Zenia Resides, MD 01/18/22 [phone]

## 2022-11-02 ENCOUNTER — Ambulatory Visit (HOSPITAL_COMMUNITY)
Admission: EM | Admit: 2022-11-02 | Discharge: 2022-11-02 | Disposition: A | Discharge status: Home / Self Care | Payer: Self-pay | Attending: Emergency Medicine | Admitting: Emergency Medicine

## 2022-11-02 ENCOUNTER — Encounter (HOSPITAL_COMMUNITY): Payer: Self-pay

## 2022-11-02 DIAGNOSIS — J0110 Acute frontal sinusitis, unspecified: Secondary | ICD-10-CM | POA: Insufficient documentation

## 2022-11-02 DIAGNOSIS — H6593 Unspecified nonsuppurative otitis media, bilateral: Secondary | ICD-10-CM | POA: Insufficient documentation

## 2022-11-02 DIAGNOSIS — N898 Other specified noninflammatory disorders of vagina: Secondary | ICD-10-CM | POA: Insufficient documentation

## 2022-11-02 MED ORDER — CETIRIZINE HCL 10 MG PO TABS: 10.0000 mg | ORAL_TABLET | Freq: Every day | ORAL | 0 refills | Status: DC

## 2022-11-02 MED ORDER — FLUTICASONE PROPIONATE 50 MCG/ACT NA SUSP: 1.0000 | Freq: Every day | NASAL | 0 refills | Status: DC

## 2022-11-02 MED ORDER — AMOXICILLIN-POT CLAVULANATE 875-125 MG PO TABS: 1.0000 | ORAL_TABLET | Freq: Two times a day (BID) | ORAL | 0 refills | Status: DC

## 2022-11-02 NOTE — ED Triage Notes (Signed)
Pt states that she has some facial pain, facial pressure, nasal congestion and bilateral ear pressure. X1 month  Pt states that she has had vaginal odor x2-3 weeks

## 2022-11-02 NOTE — ED Provider Notes (Addendum)
[facility]    CSN: [id number] Arrival date & time: 11/02/22  1407      History   Chief Complaint Chief Complaint  Patient presents with   Facial Pain    Facial pain, facial pressure, nasal congestion and bilateral ear pressure    vaginal odor    HPI Darlene Rodriguez is a 42 y.o. female.   Patient presents with sinus pain and pressure, nasal congestion, bilateral ear pressure x 3 to 4 days.  Patient also reports vaginal odor and itching x 2 to 3 weeks.  Denies abdominal pain, nausea, vomiting, diarrhea, fever, back pain, dysuria, hematuria, dizziness, and headaches.     Past Medical History:  Diagnosis Date   Diabetes [facility])    Hypertension     Patient Active Problem List   Diagnosis Date Noted   Essential hypertension 02/09/2014   Pre-diabetes 02/05/2014   Acute parotitis 02/02/2014   Morbid obesity (HCC) 02/02/2014    Past Surgical History:  Procedure Laterality Date   CESAREAN SECTION     TONSILLECTOMY      OB History   No obstetric history on file.      Home Medications    Prior to Admission medications   Medication Sig Start Date End Date Taking? Authorizing Provider  amoxicillin-clavulanate (AUGMENTIN) 875-125 MG tablet Take 1 tablet by mouth every 12 (twelve) hours. 11/02/22  Yes Wynonia Lawman A, NP  cetirizine (ZYRTEC ALLERGY) 10 MG tablet Take 1 tablet (10 mg total) by mouth daily. 11/02/22  Yes Susann Givens, Thedora Rings A, NP  fluticasone (FLONASE) 50 MCG/ACT nasal spray Place 1 spray into both nostrils daily. 11/02/22  Yes Susann Givens, Tiena Manansala A, NP  lisinopril (ZESTRIL) 10 MG tablet Take 10 mg by mouth daily. 12/27/20  Yes [provider]  metFORMIN (GLUCOPHAGE-XR) 500 MG 24 hr tablet Take 500 mg by mouth 2 (two) times daily. 12/27/20  Yes [provider]    Family History Family History  Problem Relation Age of Onset   Lung cancer Father    Heart disease Maternal Grandfather     Social History Social History    Tobacco Use   Smoking status: Never   Smokeless tobacco: Never  Vaping Use   Vaping status: Never Used  Substance Use Topics   Alcohol use: No   Drug use: No     Allergies   Diphenhydramine   Review of Systems Review of Systems  Constitutional:  Negative for chills, fatigue and fever.  HENT:  Positive for congestion, ear pain, rhinorrhea, sinus pressure and sinus pain. Negative for sore throat and trouble swallowing.   Respiratory:  Negative for cough, chest tightness and shortness of breath.   Cardiovascular:  Negative for chest pain.  Gastrointestinal:  Negative for abdominal pain, diarrhea, nausea and vomiting.  Genitourinary:  Positive for vaginal discharge. Negative for dysuria, hematuria, menstrual problem, pelvic pain, vaginal bleeding and vaginal pain.  Neurological:  Negative for dizziness, weakness, light-headedness and headaches.     Physical Exam Triage Vital Signs ED Triage Vitals  Encounter Vitals Group     BP 11/02/22 1437 (!) 166/114     Systolic BP Percentile --      Diastolic BP Percentile --      Pulse Rate 11/02/22 1437 (!) 125     Resp 11/02/22 1437 17     Temp 11/02/22 1437 98.2 F (36.8 C)     Temp Source 11/02/22 1437 Oral     SpO2 11/02/22 1437 96 %  Weight 11/02/22 1436 (!) 330 lb (149.7 kg)     Height 11/02/22 1436 5\' 4"  (1.626 m)     Head Circumference --      Peak Flow --      Pain Score 11/02/22 1436 6     Pain Loc --      Pain Education --      Exclude from Growth Chart --    No data found.  Updated Vital Signs BP (!) 166/114 (BP Location: Left Arm)   Pulse (!) 125   Temp 98.2 F (36.8 C) (Oral)   Resp 17   Ht 5\' 4"  (1.626 m)   Wt (!) 330 lb (149.7 kg)   LMP 10/18/2022   SpO2 96%   BMI 56.64 kg/m   Visual Acuity Right Eye Distance:   Left Eye Distance:   Bilateral Distance:    Right Eye Near:   Left Eye Near:    Bilateral Near:     Physical Exam Vitals and nursing note reviewed.  Constitutional:       General: She is awake. She is not in acute distress.    Appearance: Normal appearance. She is well-developed and well-groomed. She is not ill-appearing, toxic-appearing or diaphoretic.  HENT:     Right Ear: Ear canal and external ear normal. A middle ear effusion is present.     Left Ear: Ear canal and external ear normal. A middle ear effusion is present.     Nose: Congestion and rhinorrhea present.     Right Sinus: Frontal sinus tenderness present. No maxillary sinus tenderness.     Left Sinus: Frontal sinus tenderness present. No maxillary sinus tenderness.     Mouth/Throat:     Mouth: Mucous membranes are moist.     Pharynx: Posterior oropharyngeal erythema and postnasal drip present. No pharyngeal swelling or oropharyngeal exudate.     Tonsils: No tonsillar exudate.  Eyes:     Extraocular Movements: Extraocular movements intact.     Pupils: Pupils are equal, round, and reactive to light.  Cardiovascular:     Rate and Rhythm: Tachycardia present.     Heart sounds: Normal heart sounds.  Pulmonary:     Effort: Pulmonary effort is normal.     Breath sounds: Normal breath sounds.  Abdominal:     Tenderness: There is no abdominal tenderness.  Genitourinary:    Comments: Exam deferred. Musculoskeletal:        General: Normal range of motion.     Cervical back: Normal range of motion.  Skin:    General: Skin is warm and dry.  Neurological:     General: No focal deficit present.     Mental Status: She is alert and oriented to person, place, and time.     GCS: GCS eye subscore is 4. GCS verbal subscore is 5. GCS motor subscore is 6.  Psychiatric:        Behavior: Behavior is cooperative.      UC Treatments / Results  Labs (all labs ordered are listed, but only abnormal results are displayed) Labs Reviewed  CERVICOVAGINAL ANCILLARY ONLY    EKG   Radiology No results found.  Procedures Procedures (including critical care time)  Medications Ordered in UC Medications -  No data to display  Initial Impression / Assessment and Plan / UC Course  I have reviewed the triage vital signs and the nursing notes.  Pertinent labs & imaging results that were available during my care of the patient were reviewed by  me and considered in my medical decision making (see chart for details).     Patient presented with 3 to 4-day history of sinus pain and pressure, nasal congestion, and bilateral ear pressure. Patient also reports vaginal odor and itching x 2 to 3 weeks. Patient reports noticing green mucus when blowing nose.    Upon assessment patient is tender to frontal sinuses. Bilateral TMs have significant amount of fluid behind them. Mild erythema to oropharynx, congestion and rhinorrhea are present. Lungs clear bilaterally on auscultation. Patient is hypertensive and tachycardic in clinic, but patient reports that she gets anxious when she goes to the doctor and her blood pressure is not elevated when she is at home. She reports taking her blood pressure medication today.  Denies dizziness, headaches, blurred vision, shortness of breath, chest pain, confusion, weakness, and numbness.   GU exam deferred. Patient performed self swab for STD/STI.  Declined HIV and syphilis testing.  Denies dysuria, hematuria, abdominal pain, and back pain.  Prescribed Augmentin for sinusitis. Prescribed Zyrtec and Flonase for bilateral ear effusions.  Recommended following up with primary care provider if blood pressure consistently remains high at home.  Discussed follow-up, return, emergency department precautions. Final Clinical Impressions(s) / UC Diagnoses   Final diagnoses:  Acute non-recurrent frontal sinusitis  Fluid level behind tympanic membrane of both ears  Vaginal discharge     Discharge Instructions      Start taking Augmentin twice daily for 7 days. You can take Zyrtec and use Flonase daily to help with fluid behind ear drums and congestion. Return here if symptoms  persist. If you develop trouble breathing, chest pain, high fevers, weakness, numbness or confusion then seek immediate medical treatment in there ER. Please follow-up with primary care doctor if blood pressure continues to be elevated at home.     ED Prescriptions     Medication Sig Dispense Auth. Provider   amoxicillin-clavulanate (AUGMENTIN) 875-125 MG tablet Take 1 tablet by mouth every 12 (twelve) hours. 14 tablet Wynonia Lawman A, NP   cetirizine (ZYRTEC ALLERGY) 10 MG tablet Take 1 tablet (10 mg total) by mouth daily. 30 tablet Susann Givens, Taejah Ohalloran A, NP   fluticasone (FLONASE) 50 MCG/ACT nasal spray Place 1 spray into both nostrils daily. 9.9 mL Wynonia Lawman A, NP      PDMP not reviewed this encounter.   Letta Kocher, NP 11/02/22 1600    Wynonia Lawman A, NP 11/02/22 1601

## 2022-11-02 NOTE — Discharge Instructions (Signed)
Start taking Augmentin twice daily for 7 days. You can take Zyrtec and use Flonase daily to help with fluid behind ear drums and congestion. Return here if symptoms persist. If you develop trouble breathing, chest pain, high fevers, weakness, numbness or confusion then seek immediate medical treatment in there ER. Please follow-up with primary care doctor if blood pressure continues to be elevated at home.

## 2022-11-05 LAB — CERVICOVAGINAL ANCILLARY ONLY
Bacterial Vaginitis (gardnerella): NEGATIVE
Candida Glabrata: NEGATIVE
Candida Vaginitis: NEGATIVE
Chlamydia: NEGATIVE
Comment: NEGATIVE
Comment: NEGATIVE
Comment: NEGATIVE
Comment: NEGATIVE
Comment: NEGATIVE
Comment: NORMAL
Neisseria Gonorrhea: NEGATIVE
Trichomonas: NEGATIVE

## 2023-10-03 ENCOUNTER — Ambulatory Visit (HOSPITAL_COMMUNITY)
Admission: EM | Admit: 2023-10-03 | Discharge: 2023-10-03 | Disposition: A | Discharge status: Home / Self Care | Payer: Self-pay | Attending: Family Medicine | Admitting: Family Medicine

## 2023-10-03 ENCOUNTER — Other Ambulatory Visit: Payer: Self-pay

## 2023-10-03 ENCOUNTER — Encounter (HOSPITAL_COMMUNITY): Payer: Self-pay | Admitting: *Deleted

## 2023-10-03 DIAGNOSIS — R21 Rash and other nonspecific skin eruption: Secondary | ICD-10-CM

## 2023-10-03 MED ORDER — PREDNISONE 10 MG (48) PO TBPK: ORAL_TABLET | ORAL | 0 refills | Status: DC

## 2023-10-03 MED ORDER — AMOXICILLIN-POT CLAVULANATE 875-125 MG PO TABS: 1.0000 | ORAL_TABLET | Freq: Two times a day (BID) | ORAL | 0 refills | Status: DC

## 2023-10-03 NOTE — ED Provider Notes (Signed)
 [facility]   [id number] 10/03/23 Arrival Time: 1509  ASSESSMENT & PLAN:  1. Rash and nonspecific skin eruption    Looks like infected poison ivy. Distribution isn't consistent with shingles.  Begin: Meds ordered this encounter  Medications   predniSONE  (STERAPRED UNI-PAK 48 TAB) 10 MG (48) TBPK tablet    Sig: Take as directed.    Dispense:  48 tablet    Refill:  0   amoxicillin -clavulanate (AUGMENTIN ) 875-125 MG tablet    Sig: Take 1 tablet by mouth every 12 (twelve) hours.    Dispense:  14 tablet    Refill:  0     Will follow up with PCP or here if worsening or failing to improve as anticipated. Reviewed expectations re: course of current medical issues. Questions answered. Outlined signs and symptoms indicating need for more acute intervention. Patient verbalized understanding. After Visit Summary given.   SUBJECTIVE:  Darlene Rodriguez is a 43 y.o. female who presents with a skin complaint. C/O starting with few areas of rash to right upper back, mid-back, and left chest approx 3 days ago. States initially sites were pruritic, but now are painful, red. Back sites are noted to be crusty. Has been applying calamine lotion. Denies fever. Does walk dog in woods frequently.  OBJECTIVE: Vitals:   10/03/23 1551  BP: (!) 133/93  Pulse: (!) 104  Resp: (!) 24  Temp: 98.2 F (36.8 C)  TempSrc: Oral  SpO2: 95%    Recheck P: 92 Recheck RR: 16  General appearance: alert; no distress HEENT: [place]; AT Neck: supple with FROM Extremities: no edema; moves all extremities normally Skin: warm and dry; areas of linear papules and vesicles with surrounding erythema; some areas with confluence on lower mid and upper right back; upper right back area with honey-colored crusting Psychological: alert and cooperative; normal mood and affect  Allergies  Allergen Reactions   Diphenhydramine Shortness Of Breath    Past Medical History:  Diagnosis Date   Diabetes (HCC)     Hypertension    Social History   Socioeconomic History   Marital status: Single    Spouse name: Not on file   Number of children: 1   Years of education: 12   Highest education level: Not on file  Occupational History   Occupation: Child Care  Tobacco Use   Smoking status: Never   Smokeless tobacco: Never  Vaping Use   Vaping status: Never Used  Substance and Sexual Activity   Alcohol use: No   Drug use: No   Sexual activity: Not Currently  Other Topics Concern   Not on file  Social History Narrative   Born and raised in [place], [place]. Currently resides in a house with her daughter. No pets. Fun: Shopping, swimming, bowling, anything outdoors.   Denies religious beliefs that would effect health care.    Social Drivers of Health   Financial Resource Strain: Unknown (04/09/2021)   Received from [facility]   Overall Financial Resource Strain (CARDIA)    Difficulty of Paying Living Expenses: Patient declined  Food Insecurity: Unknown (04/09/2021)   Received from [facility]   Hunger Vital Sign    Within the past 12 months, you worried that your food would run out before you got the money to buy more.: Patient declined    Within the past 12 months, the food you bought just didn't last and you didn't have money to get more.: Patient declined  Transportation Needs: Unknown (04/09/2021)   Received  from [facility]   PRAPARE - Transportation    Lack of Transportation (Medical): Patient declined    Lack of Transportation (Non-Medical): Patient declined  Physical Activity: Sufficiently Active (04/09/2021)   Received from [facility]   Exercise Vital Sign    On average, how many days per week do you engage in moderate to strenuous exercise (like a brisk walk)?: 6 days    On average, how many minutes do you engage in exercise at this level?: 30 min  Stress: Unknown (04/09/2021)   Received from [facility]  Questionnaire    Feeling of Stress : Patient declined  Social Connections: Unknown (06/05/2021)   Received from [facility]   Social Network    Social Network: Not on file  Intimate Partner Violence: Unknown (05/08/2021)   Received from [facility]   HITS    Physically Hurt: Not on file    Insult or Talk Down To: Not on file    Threaten Physical Harm: Not on file    Scream or Curse: Not on file   Family History  Problem Relation Age of Onset   Lung cancer Father    Heart disease Maternal Grandfather    Past Surgical History:  Procedure Laterality Date   CESAREAN SECTION     TONSILLECTOMY        Rolinda Rogue, MD 10/03/23 [phone]

## 2023-10-03 NOTE — ED Triage Notes (Signed)
 C/O starting with few areas of rash to right upper back, mid-back, and left chest approx 3 days ago. States initially sites were pruritic, but now are painful, red. Back sites are noted to be crusty. Has been applying calamine lotion.

## 2023-10-11 ENCOUNTER — Encounter (HOSPITAL_COMMUNITY): Payer: Self-pay

## 2023-10-11 ENCOUNTER — Ambulatory Visit (HOSPITAL_COMMUNITY)
Admission: EM | Admit: 2023-10-11 | Discharge: 2023-10-11 | Disposition: A | Discharge status: Home / Self Care | Payer: Self-pay | Attending: Emergency Medicine | Admitting: Emergency Medicine

## 2023-10-11 DIAGNOSIS — L02212 Cutaneous abscess of back [any part, except buttock]: Secondary | ICD-10-CM

## 2023-10-11 MED ORDER — DOXYCYCLINE HYCLATE 100 MG PO CAPS: 100.0000 mg | ORAL_CAPSULE | Freq: Two times a day (BID) | ORAL | 0 refills | Status: DC

## 2023-10-11 MED ORDER — IBUPROFEN 800 MG PO TABS: 800.0000 mg | ORAL_TABLET | Freq: Three times a day (TID) | ORAL | 0 refills | Status: DC

## 2023-10-11 NOTE — Discharge Instructions (Signed)
 Please take medication as prescribed. Take with food to avoid upset stomach. You should not have any leftover  Ibuprofen  600 mg can be alternated with tylenol  650 mg every 4 hours for pain Apply hot pad or warm compress to the area to soften  Please drink lots of water!

## 2023-10-11 NOTE — ED Provider Notes (Signed)
 [facility]    CSN: [id number] Arrival date & time: 10/11/23  9141      History   Chief Complaint Chief Complaint  Patient presents with   Abscess    HPI Darlene Rodriguez is a 43 y.o. female.  2-day history of hard painful area on the left middle/low back.  Feels hot to touch.  She has tried Tylenol  and a hot shower.  Was seen 1 week ago for likely infected poison oak. Also was in this area of the back. Finished Augmentin  and prednisone .  This new concern started after finishing meds. The other rash has resolved.   No fever, chills, nausea/vomiting  History of hypertension.  She is taking her medicine as directed. Elevated BP today.  She is denying any headache, dizziness, vision changes, chest pain or tightness, shortness of breath, abdominal pain, weakness.  Past Medical History:  Diagnosis Date   Diabetes [facility])    Hypertension     Patient Active Problem List   Diagnosis Date Noted   Essential hypertension 02/09/2014   Pre-diabetes 02/05/2014   Acute parotitis 02/02/2014   Morbid obesity (HCC) 02/02/2014    Past Surgical History:  Procedure Laterality Date   CESAREAN SECTION     TONSILLECTOMY      OB History   No obstetric history on file.      Home Medications    Prior to Admission medications   Medication Sig Start Date End Date Taking? Authorizing Provider  doxycycline  (VIBRAMYCIN ) 100 MG capsule Take 1 capsule (100 mg total) by mouth 2 (two) times daily for 7 days. 10/11/23 10/18/23 Yes Kathy Wahid, [place], PA-C  ibuprofen  (ADVIL ) 800 MG tablet Take 1 tablet (800 mg total) by mouth 3 (three) times daily. 10/11/23  Yes Marlene Pfluger, [place], PA-C  lisinopril  (ZESTRIL ) 10 MG tablet Take 10 mg by mouth daily. 12/27/20   [provider]  predniSONE  (STERAPRED UNI-PAK 48 TAB) 10 MG (48) TBPK tablet Take as directed. 10/03/23   Rolinda Rogue, MD    Family History Family History  Problem Relation Age of Onset   Lung cancer Father    Heart disease  Maternal Grandfather     Social History Social History   Tobacco Use   Smoking status: Never   Smokeless tobacco: Never  Vaping Use   Vaping status: Never Used  Substance Use Topics   Alcohol use: No   Drug use: No     Allergies   Diphenhydramine   Review of Systems Review of Systems As per HPI  Physical Exam Triage Vital Signs ED Triage Vitals  Encounter Vitals Group     BP      Girls Systolic BP Percentile      Girls Diastolic BP Percentile      Boys Systolic BP Percentile      Boys Diastolic BP Percentile      Pulse      Resp      Temp      Temp src      SpO2      Weight      Height      Head Circumference      Peak Flow      Pain Score      Pain Loc      Pain Education      Exclude from Growth Chart    No data found.  Updated Vital Signs BP (!) 170/103 (BP Location: Left Wrist)   Pulse (!) 103   Temp 98.5  F (36.9 C) (Oral)   Resp 18   LMP 10/01/2023 (Exact Date)   SpO2 97%    Physical Exam Vitals and nursing note reviewed.  Constitutional:      General: She is not in acute distress.    Appearance: Normal appearance. She is obese.  HENT:     Mouth/Throat:     Pharynx: Oropharynx is clear.  Eyes:     Extraocular Movements: Extraocular movements intact.     Conjunctiva/sclera: Conjunctivae normal.     Pupils: Pupils are equal, round, and reactive to light.  Cardiovascular:     Rate and Rhythm: Normal rate and regular rhythm.     Pulses: Normal pulses.     Heart sounds: Normal heart sounds.     Comments: Initially tachycardic but resolved. Regular rhythm  Pulmonary:     Effort: Pulmonary effort is normal.     Breath sounds: Normal breath sounds.  Skin:    Findings: Abscess present.         Comments: Large area of induration on the left middle/low back. About 3 inch x 3 inch. Erythematous, tender, and hot to touch. No fluctuance. Dry skin surrounding.  Neurological:     General: No focal deficit present.     Mental Status: She is  alert and oriented to person, place, and time.     Cranial Nerves: No cranial nerve deficit.     Sensory: Sensation is intact.     Motor: Motor function is intact.     Coordination: Coordination is intact.     Gait: Gait is intact.      UC Treatments / Results  Labs (all labs ordered are listed, but only abnormal results are displayed) Labs Reviewed - No data to display  EKG  Radiology No results found.  Procedures Procedures (including critical care time)  Medications Ordered in UC Medications - No data to display  Initial Impression / Assessment and Plan / UC Course  I have reviewed the triage vital signs and the nursing notes.  Pertinent labs & imaging results that were available during my care of the patient were reviewed by me and considered in my medical decision making (see chart for details).  Elevated BP 170/103.  Taking antihypertensive as directed.  Attribute this to level of pain.  She was mildly tachycardic during my exam - normalized with slow deep breathing.  She denies feeling any discomfort in the chest or palpitations.  Neurologically intact  Advised to monitor at home. Drink lots of fluids  Abscess, area of induration. Cannot be drained at this time.  Declines offer of IM toradol  in clinic today. Doxycycline  BID x 7 days. Alternate ibu and tylenol  q4 hours prn, warm compresses. Advised reasons to return to clinic. Agrees to plan, all questions answered Note for work is provided   Final Clinical Impressions(s) / UC Diagnoses   Final diagnoses:  Abscess of back     Discharge Instructions      Please take medication as prescribed. Take with food to avoid upset stomach. You should not have any leftover  Ibuprofen  600 mg can be alternated with tylenol  650 mg every 4 hours for pain Apply hot pad or warm compress to the area to soften  Please drink lots of water!      ED Prescriptions     Medication Sig Dispense Auth. Provider    doxycycline  (VIBRAMYCIN ) 100 MG capsule Take 1 capsule (100 mg total) by mouth 2 (two) times daily for 7 days.  14 capsule Amunique Neyra, PA-C   ibuprofen  (ADVIL ) 800 MG tablet Take 1 tablet (800 mg total) by mouth 3 (three) times daily. 21 tablet Jailyne Chieffo, [place], PA-C      PDMP not reviewed this encounter.   Jeryl [place]berry, [place] 10/11/23 1105

## 2023-10-11 NOTE — ED Triage Notes (Signed)
 Pt c/o abscess to center/mid back x2 days. C/o tender, red, and hard. States used heating pad and hot showers with some relief.

## 2023-10-14 ENCOUNTER — Encounter (HOSPITAL_COMMUNITY): Payer: Self-pay

## 2023-10-14 ENCOUNTER — Emergency Department (HOSPITAL_COMMUNITY): Payer: Self-pay

## 2023-10-14 ENCOUNTER — Other Ambulatory Visit: Payer: Self-pay

## 2023-10-14 ENCOUNTER — Encounter (HOSPITAL_COMMUNITY): Admission: EM | Disposition: A | Discharge status: Home / Self Care | Payer: Self-pay | Attending: Family Medicine

## 2023-10-14 ENCOUNTER — Inpatient Hospital Stay (HOSPITAL_COMMUNITY)
Admission: EM | Admit: 2023-10-14 | Discharge: 2023-10-18 | DRG: 872 | Disposition: A | Discharge status: Home / Self Care | Payer: Self-pay | Source: Other Acute Inpatient Hospital | Attending: Family Medicine | Admitting: Family Medicine

## 2023-10-14 ENCOUNTER — Inpatient Hospital Stay (HOSPITAL_COMMUNITY): Payer: Self-pay | Admitting: Anesthesiology

## 2023-10-14 ENCOUNTER — Ambulatory Visit (HOSPITAL_COMMUNITY)
Admission: EM | Admit: 2023-10-14 | Discharge: 2023-10-14 | Disposition: A | Discharge status: Home / Self Care | Payer: Self-pay

## 2023-10-14 DIAGNOSIS — L03312 Cellulitis of back [any part except buttock]: Secondary | ICD-10-CM

## 2023-10-14 DIAGNOSIS — R0789 Other chest pain: Secondary | ICD-10-CM | POA: Diagnosis not present

## 2023-10-14 DIAGNOSIS — L03317 Cellulitis of buttock: Secondary | ICD-10-CM | POA: Diagnosis present

## 2023-10-14 DIAGNOSIS — M726 Necrotizing fasciitis: Secondary | ICD-10-CM

## 2023-10-14 DIAGNOSIS — L02212 Cutaneous abscess of back [any part, except buttock]: Secondary | ICD-10-CM | POA: Diagnosis present

## 2023-10-14 DIAGNOSIS — I10 Essential (primary) hypertension: Secondary | ICD-10-CM | POA: Diagnosis present

## 2023-10-14 DIAGNOSIS — Z79899 Other long term (current) drug therapy: Secondary | ICD-10-CM

## 2023-10-14 DIAGNOSIS — Z801 Family history of malignant neoplasm of trachea, bronchus and lung: Secondary | ICD-10-CM

## 2023-10-14 DIAGNOSIS — Z794 Long term (current) use of insulin: Secondary | ICD-10-CM

## 2023-10-14 DIAGNOSIS — Z888 Allergy status to other drugs, medicaments and biological substances status: Secondary | ICD-10-CM

## 2023-10-14 DIAGNOSIS — E871 Hypo-osmolality and hyponatremia: Secondary | ICD-10-CM | POA: Diagnosis present

## 2023-10-14 DIAGNOSIS — K469 Unspecified abdominal hernia without obstruction or gangrene: Secondary | ICD-10-CM | POA: Diagnosis present

## 2023-10-14 DIAGNOSIS — Z8249 Family history of ischemic heart disease and other diseases of the circulatory system: Secondary | ICD-10-CM

## 2023-10-14 DIAGNOSIS — E1152 Type 2 diabetes mellitus with diabetic peripheral angiopathy with gangrene: Secondary | ICD-10-CM | POA: Diagnosis present

## 2023-10-14 DIAGNOSIS — E1165 Type 2 diabetes mellitus with hyperglycemia: Secondary | ICD-10-CM | POA: Diagnosis present

## 2023-10-14 DIAGNOSIS — L03818 Cellulitis of other sites: Secondary | ICD-10-CM

## 2023-10-14 DIAGNOSIS — E119 Type 2 diabetes mellitus without complications: Secondary | ICD-10-CM

## 2023-10-14 DIAGNOSIS — Z6841 Body Mass Index (BMI) 40.0 and over, adult: Secondary | ICD-10-CM

## 2023-10-14 DIAGNOSIS — B9561 Methicillin susceptible Staphylococcus aureus infection as the cause of diseases classified elsewhere: Secondary | ICD-10-CM | POA: Diagnosis present

## 2023-10-14 DIAGNOSIS — A419 Sepsis, unspecified organism: Principal | ICD-10-CM | POA: Diagnosis present

## 2023-10-14 HISTORY — PX: INCISION AND DRAINAGE OF WOUND: SHX1803

## 2023-10-14 LAB — CBC WITH DIFFERENTIAL/PLATELET
Abs Immature Granulocytes: 0.21 K/uL — ABNORMAL HIGH (ref 0.00–0.07)
Basophils Absolute: 0.1 K/uL (ref 0.0–0.1)
Basophils Relative: 1 %
Eosinophils Absolute: 0.6 K/uL — ABNORMAL HIGH (ref 0.0–0.5)
Eosinophils Relative: 3 %
HCT: 43.7 % (ref 36.0–46.0)
Hemoglobin: 14.5 g/dL (ref 12.0–15.0)
Immature Granulocytes: 1 %
Lymphocytes Relative: 10 %
Lymphs Abs: 1.9 K/uL (ref 0.7–4.0)
MCH: 30 pg (ref 26.0–34.0)
MCHC: 33.2 g/dL (ref 30.0–36.0)
MCV: 90.3 fL (ref 80.0–100.0)
Monocytes Absolute: 1.2 K/uL — ABNORMAL HIGH (ref 0.1–1.0)
Monocytes Relative: 7 %
Neutro Abs: 14.8 K/uL — ABNORMAL HIGH (ref 1.7–7.7)
Neutrophils Relative %: 78 %
Platelets: 357 K/uL (ref 150–400)
RBC: 4.84 MIL/uL (ref 3.87–5.11)
RDW: 12.7 % (ref 11.5–15.5)
WBC: 18.9 K/uL — ABNORMAL HIGH (ref 4.0–10.5)
nRBC: 0 % (ref 0.0–0.2)

## 2023-10-14 LAB — PROTIME-INR
INR: 1.2 (ref 0.8–1.2)
Prothrombin Time: 15.5 s — ABNORMAL HIGH (ref 11.4–15.2)

## 2023-10-14 LAB — HIV ANTIBODY (ROUTINE TESTING W REFLEX): HIV Screen 4th Generation wRfx: NONREACTIVE

## 2023-10-14 LAB — COMPREHENSIVE METABOLIC PANEL WITH GFR
ALT: 12 U/L (ref 0–44)
AST: 11 U/L — ABNORMAL LOW (ref 15–41)
Albumin: 3 g/dL — ABNORMAL LOW (ref 3.5–5.0)
Alkaline Phosphatase: 98 U/L (ref 38–126)
Anion gap: 12 (ref 5–15)
BUN: 10 mg/dL (ref 6–20)
CO2: 23 mmol/L (ref 22–32)
Calcium: 9.3 mg/dL (ref 8.9–10.3)
Chloride: 101 mmol/L (ref 98–111)
Creatinine, Ser: 0.58 mg/dL (ref 0.44–1.00)
GFR, Estimated: >60 mL/min (ref >60)
Glucose, Bld: 285 mg/dL — ABNORMAL HIGH (ref 70–99)
Potassium: 3.6 mmol/L (ref 3.5–5.1)
Sodium: 136 mmol/L (ref 135–145)
Total Bilirubin: 0.9 mg/dL (ref 0.0–1.2)
Total Protein: 7.1 g/dL (ref 6.5–8.1)

## 2023-10-14 LAB — RESP PANEL BY RT-PCR (RSV, FLU A&B, COVID)  RVPGX2
Influenza A by PCR: NEGATIVE
Influenza B by PCR: NEGATIVE
Resp Syncytial Virus by PCR: NEGATIVE
SARS Coronavirus 2 by RT PCR: NEGATIVE

## 2023-10-14 LAB — HCG, SERUM, QUALITATIVE: Preg, Serum: NEGATIVE

## 2023-10-14 LAB — HEMOGLOBIN A1C
Hgb A1c MFr Bld: 7.7 % — ABNORMAL HIGH (ref 4.8–5.6)
Mean Plasma Glucose: 174.29 mg/dL

## 2023-10-14 LAB — GLUCOSE, CAPILLARY
Glucose-Capillary: 213 mg/dL — ABNORMAL HIGH (ref 70–99)
Glucose-Capillary: 184 mg/dL — ABNORMAL HIGH (ref 70–99)
Glucose-Capillary: 214 mg/dL — ABNORMAL HIGH (ref 70–99)

## 2023-10-14 LAB — I-STAT CG4 LACTIC ACID, ED: Lactic Acid, Venous: 1.9 mmol/L (ref 0.5–1.9)

## 2023-10-14 SURGERY — IRRIGATION AND DEBRIDEMENT WOUND
Anesthesia: General

## 2023-10-14 MED ORDER — SUCCINYLCHOLINE CHLORIDE 200 MG/10ML IV SOSY
PREFILLED_SYRINGE | INTRAVENOUS | Status: DC | PRN
  Administered 2023-10-14: 160 mg via INTRAVENOUS

## 2023-10-14 MED ORDER — AMISULPRIDE (ANTIEMETIC) 5 MG/2ML IV SOLN: 10.0000 mg | Freq: Once | INTRAVENOUS | Status: DC | PRN

## 2023-10-14 MED ORDER — LACTATED RINGERS IV SOLN: INTRAVENOUS | Status: DC

## 2023-10-14 MED ORDER — CHLORHEXIDINE GLUCONATE 0.12 % MT SOLN: 15.0000 mL | Freq: Once | OROMUCOSAL | Status: AC

## 2023-10-14 MED ORDER — FENTANYL CITRATE (PF) 250 MCG/5ML IJ SOLN
INTRAMUSCULAR | Status: DC | PRN
  Administered 2023-10-14: 50 ug via INTRAVENOUS

## 2023-10-14 MED ORDER — LACTATED RINGERS IV SOLN: INTRAVENOUS | Status: AC

## 2023-10-14 MED ORDER — LISINOPRIL 10 MG PO TABS
10.0000 mg | ORAL_TABLET | Freq: Every day | ORAL | Status: DC
  Administered 2023-10-15 – 2023-10-18 (×4): 10 mg via ORAL
  Filled 2023-10-14 (×4): qty 1

## 2023-10-14 MED ORDER — ONDANSETRON HCL 4 MG/2ML IJ SOLN
INTRAMUSCULAR | Status: AC
  Filled 2023-10-14: qty 2

## 2023-10-14 MED ORDER — HYDROMORPHONE HCL 1 MG/ML IJ SOLN
1.0000 mg | INTRAMUSCULAR | Status: DC | PRN
  Administered 2023-10-15 – 2023-10-16 (×3): 1 mg via INTRAVENOUS
  Filled 2023-10-14 (×3): qty 1

## 2023-10-14 MED ORDER — CEFTRIAXONE SODIUM 2 G IJ SOLR: 2.0000 g | INTRAMUSCULAR | Status: DC

## 2023-10-14 MED ORDER — FENTANYL CITRATE (PF) 100 MCG/2ML IJ SOLN
INTRAMUSCULAR | Status: AC
  Filled 2023-10-14: qty 2

## 2023-10-14 MED ORDER — SODIUM CHLORIDE 0.9 % IV SOLN: 12.5000 mg | INTRAVENOUS | Status: DC | PRN

## 2023-10-14 MED ORDER — CHLORHEXIDINE GLUCONATE 0.12 % MT SOLN
OROMUCOSAL | Status: AC
  Administered 2023-10-14: 15 mL via OROMUCOSAL
  Filled 2023-10-14: qty 15

## 2023-10-14 MED ORDER — FENTANYL CITRATE (PF) 100 MCG/2ML IJ SOLN
25.0000 ug | INTRAMUSCULAR | Status: DC | PRN
  Administered 2023-10-14 (×3): 50 ug via INTRAVENOUS

## 2023-10-14 MED ORDER — ROCURONIUM BROMIDE 10 MG/ML (PF) SYRINGE
PREFILLED_SYRINGE | INTRAVENOUS | Status: DC | PRN
  Administered 2023-10-14: 30 mg via INTRAVENOUS

## 2023-10-14 MED ORDER — OXYCODONE-ACETAMINOPHEN 5-325 MG PO TABS
2.0000 | ORAL_TABLET | Freq: Once | ORAL | Status: AC
  Administered 2023-10-14: 2 via ORAL
  Filled 2023-10-14: qty 2

## 2023-10-14 MED ORDER — OXYCODONE HCL 5 MG/5ML PO SOLN: 5.0000 mg | Freq: Once | ORAL | Status: DC | PRN

## 2023-10-14 MED ORDER — LACTATED RINGERS IV SOLN: INTRAVENOUS | Status: DC | PRN

## 2023-10-14 MED ORDER — ORAL CARE MOUTH RINSE: 15.0000 mL | Freq: Once | OROMUCOSAL | Status: AC

## 2023-10-14 MED ORDER — INSULIN ASPART 100 UNIT/ML IJ SOLN
0.0000 [IU] | Freq: Three times a day (TID) | INTRAMUSCULAR | Status: DC
  Administered 2023-10-15: 3 [IU] via SUBCUTANEOUS
  Administered 2023-10-15 – 2023-10-16 (×2): 5 [IU] via SUBCUTANEOUS
  Administered 2023-10-16 – 2023-10-18 (×7): 3 [IU] via SUBCUTANEOUS
  Administered 2023-10-18: 5 [IU] via SUBCUTANEOUS

## 2023-10-14 MED ORDER — VANCOMYCIN HCL IN DEXTROSE 1-5 GM/200ML-% IV SOLN
1000.0000 mg | Freq: Once | INTRAVENOUS | Status: AC
  Administered 2023-10-14: 1000 mg via INTRAVENOUS
  Filled 2023-10-14: qty 200

## 2023-10-14 MED ORDER — HYDROMORPHONE HCL 1 MG/ML IJ SOLN
0.2500 mg | INTRAMUSCULAR | Status: DC | PRN
  Administered 2023-10-14 (×3): 0.5 mg via INTRAVENOUS

## 2023-10-14 MED ORDER — LABETALOL HCL 5 MG/ML IV SOLN
10.0000 mg | INTRAVENOUS | Status: DC | PRN
  Administered 2023-10-16: 10 mg via INTRAVENOUS
  Filled 2023-10-14: qty 4

## 2023-10-14 MED ORDER — PHENOL 1.4 % MT LIQD
1.0000 | OROMUCOSAL | Status: DC | PRN
  Administered 2023-10-15: 1 via OROMUCOSAL
  Filled 2023-10-14: qty 177

## 2023-10-14 MED ORDER — HYDROMORPHONE HCL 1 MG/ML IJ SOLN
INTRAMUSCULAR | Status: AC
  Filled 2023-10-14: qty 1

## 2023-10-14 MED ORDER — PROPOFOL 10 MG/ML IV BOLUS
INTRAVENOUS | Status: AC
  Filled 2023-10-14: qty 20

## 2023-10-14 MED ORDER — SODIUM CHLORIDE 0.9 % IV SOLN
2.0000 g | INTRAVENOUS | Status: DC
  Administered 2023-10-15 – 2023-10-18 (×4): 2 g via INTRAVENOUS
  Filled 2023-10-14 (×4): qty 20

## 2023-10-14 MED ORDER — POLYETHYLENE GLYCOL 3350 17 G PO PACK: 17.0000 g | PACK | Freq: Two times a day (BID) | ORAL | Status: DC | PRN

## 2023-10-14 MED ORDER — INSULIN ASPART 100 UNIT/ML IJ SOLN: 0.0000 [IU] | Freq: Three times a day (TID) | INTRAMUSCULAR | Status: DC

## 2023-10-14 MED ORDER — VANCOMYCIN HCL 1500 MG/300ML IV SOLN
1500.0000 mg | Freq: Once | INTRAVENOUS | Status: AC
Start: 2023-10-14 — End: 2023-10-15
  Administered 2023-10-14: 1500 mg via INTRAVENOUS
  Filled 2023-10-14: qty 300

## 2023-10-14 MED ORDER — ONDANSETRON HCL 4 MG/2ML IJ SOLN
INTRAMUSCULAR | Status: DC | PRN
  Administered 2023-10-14: 4 mg via INTRAVENOUS

## 2023-10-14 MED ORDER — LIDOCAINE HCL 1 % IJ SOLN
INTRAMUSCULAR | Status: DC | PRN
  Administered 2023-10-14: 20 mL via INTRAMUSCULAR

## 2023-10-14 MED ORDER — INSULIN ASPART 100 UNIT/ML IJ SOLN
0.0000 [IU] | INTRAMUSCULAR | Status: DC | PRN
  Administered 2023-10-14: 4 [IU] via SUBCUTANEOUS

## 2023-10-14 MED ORDER — OXYCODONE HCL 5 MG PO TABS: 5.0000 mg | ORAL_TABLET | Freq: Once | ORAL | Status: DC | PRN

## 2023-10-14 MED ORDER — DEXAMETHASONE SODIUM PHOSPHATE 10 MG/ML IJ SOLN
INTRAMUSCULAR | Status: AC
  Filled 2023-10-14: qty 1

## 2023-10-14 MED ORDER — BUPIVACAINE-EPINEPHRINE (PF) 0.5% -1:200000 IJ SOLN
INTRAMUSCULAR | Status: AC
  Filled 2023-10-14: qty 30

## 2023-10-14 MED ORDER — SUGAMMADEX SODIUM 200 MG/2ML IV SOLN
INTRAVENOUS | Status: DC | PRN
Start: 2023-10-14 — End: 2023-10-14
  Administered 2023-10-14: 400 mg via INTRAVENOUS
  Administered 2023-10-14: 200 mg via INTRAVENOUS

## 2023-10-14 MED ORDER — PROPOFOL 10 MG/ML IV BOLUS
INTRAVENOUS | Status: DC | PRN
  Administered 2023-10-14: 200 mg via INTRAVENOUS

## 2023-10-14 MED ORDER — LIDOCAINE 2% (20 MG/ML) 5 ML SYRINGE
INTRAMUSCULAR | Status: DC | PRN
  Administered 2023-10-14: 60 mg via INTRAVENOUS

## 2023-10-14 MED ORDER — SENNOSIDES-DOCUSATE SODIUM 8.6-50 MG PO TABS: 1.0000 | ORAL_TABLET | Freq: Two times a day (BID) | ORAL | Status: DC | PRN

## 2023-10-14 MED ORDER — CLINDAMYCIN PHOSPHATE 600 MG/50ML IV SOLN
600.0000 mg | Freq: Once | INTRAVENOUS | Status: AC
  Administered 2023-10-14: 600 mg via INTRAVENOUS
  Filled 2023-10-14: qty 50

## 2023-10-14 MED ORDER — FENTANYL CITRATE (PF) 250 MCG/5ML IJ SOLN
INTRAMUSCULAR | Status: AC
  Filled 2023-10-14: qty 5

## 2023-10-14 MED ORDER — BUPIVACAINE-EPINEPHRINE (PF) 0.25% -1:200000 IJ SOLN
INTRAMUSCULAR | Status: AC
  Filled 2023-10-14: qty 30

## 2023-10-14 MED ORDER — IOHEXOL 350 MG/ML SOLN
75.0000 mL | Freq: Once | INTRAVENOUS | Status: AC | PRN
  Administered 2023-10-14: 75 mL via INTRAVENOUS

## 2023-10-14 MED ORDER — SODIUM CHLORIDE 0.9 % IV SOLN
2.0000 g | Freq: Once | INTRAVENOUS | Status: AC
  Administered 2023-10-14: 2 g via INTRAVENOUS
  Filled 2023-10-14: qty 20

## 2023-10-14 MED ORDER — OXYCODONE HCL 5 MG PO TABS
5.0000 mg | ORAL_TABLET | ORAL | Status: DC | PRN
  Administered 2023-10-14 – 2023-10-15 (×3): 5 mg via ORAL
  Administered 2023-10-15 – 2023-10-17 (×5): 10 mg via ORAL
  Filled 2023-10-14: qty 1
  Filled 2023-10-14: qty 2
  Filled 2023-10-14: qty 1
  Filled 2023-10-14 (×4): qty 2
  Filled 2023-10-14: qty 1

## 2023-10-14 MED ORDER — DEXAMETHASONE SODIUM PHOSPHATE 10 MG/ML IJ SOLN
INTRAMUSCULAR | Status: DC | PRN
  Administered 2023-10-14: 5 mg via INTRAVENOUS

## 2023-10-14 MED ORDER — INSULIN GLARGINE 100 UNIT/ML ~~LOC~~ SOLN
15.0000 [IU] | Freq: Every day | SUBCUTANEOUS | Status: DC
  Filled 2023-10-14 (×2): qty 0.15

## 2023-10-14 MED ORDER — INSULIN ASPART 100 UNIT/ML IJ SOLN
INTRAMUSCULAR | Status: AC
  Administered 2023-10-15: 5 [IU] via SUBCUTANEOUS
  Filled 2023-10-14: qty 1

## 2023-10-14 MED ORDER — INSULIN ASPART 100 UNIT/ML IJ SOLN
0.0000 [IU] | Freq: Every day | INTRAMUSCULAR | Status: DC
  Administered 2023-10-15: 2 [IU] via SUBCUTANEOUS

## 2023-10-14 MED ORDER — VANCOMYCIN HCL 1250 MG/250ML IV SOLN
1250.0000 mg | Freq: Two times a day (BID) | INTRAVENOUS | Status: DC
  Administered 2023-10-15 – 2023-10-18 (×7): 1250 mg via INTRAVENOUS
  Filled 2023-10-14 (×8): qty 250

## 2023-10-14 MED ORDER — LIDOCAINE HCL (PF) 2 % IJ SOLN
INTRAMUSCULAR | Status: AC
  Filled 2023-10-14: qty 5

## 2023-10-14 MED ORDER — LACTATED RINGERS IV BOLUS
1000.0000 mL | Freq: Once | INTRAVENOUS | Status: AC
Start: 2023-10-14 — End: 2023-10-14
  Administered 2023-10-14: 1000 mL via INTRAVENOUS

## 2023-10-14 MED ORDER — MIDAZOLAM HCL 2 MG/2ML IJ SOLN
INTRAMUSCULAR | Status: AC
  Filled 2023-10-14: qty 2

## 2023-10-14 MED ORDER — MIDAZOLAM HCL 2 MG/2ML IJ SOLN
INTRAMUSCULAR | Status: DC | PRN
Start: 2023-10-14 — End: 2023-10-14
  Administered 2023-10-14: 2 mg via INTRAVENOUS

## 2023-10-14 MED ORDER — SUCCINYLCHOLINE CHLORIDE 200 MG/10ML IV SOSY
PREFILLED_SYRINGE | INTRAVENOUS | Status: AC
  Filled 2023-10-14: qty 10

## 2023-10-14 MED ORDER — INSULIN GLARGINE-YFGN 100 UNIT/ML ~~LOC~~ SOLN: 15.0000 [IU] | Freq: Every day | SUBCUTANEOUS | Status: DC

## 2023-10-14 SURGICAL SUPPLY — 27 items
BAG COUNTER SPONGE SURGICOUNT (BAG) ×1 IMPLANT
BNDG GAUZE DERMACEA FLUFF 4 (GAUZE/BANDAGES/DRESSINGS) IMPLANT
BNDG STRETCH 4X75 NS LF (GAUZE/BANDAGES/DRESSINGS) IMPLANT
CANISTER SUCTION 3000ML PPV (SUCTIONS) ×1 IMPLANT
COVER SURGICAL LIGHT HANDLE (MISCELLANEOUS) ×1 IMPLANT
DRAPE LAPAROSCOPIC ABDOMINAL (DRAPES) IMPLANT
DRAPE LAPAROTOMY 100X72 PEDS (DRAPES) IMPLANT
ELECTRODE REM PT RTRN 9FT ADLT (ELECTROSURGICAL) ×1 IMPLANT
GAUZE PAD ABD 7.5X8 STRL (GAUZE/BANDAGES/DRESSINGS) IMPLANT
GAUZE PAD ABD 8X10 STRL (GAUZE/BANDAGES/DRESSINGS) IMPLANT
GAUZE SPONGE 4X4 12PLY STRL (GAUZE/BANDAGES/DRESSINGS) IMPLANT
GAUZE SPONGE 4X4 12PLY STRL LF (GAUZE/BANDAGES/DRESSINGS) IMPLANT
GAUZE STRETCH 2X75IN STRL (MISCELLANEOUS) IMPLANT
GLOVE BIOGEL PI IND STRL 8 (GLOVE) ×1 IMPLANT
GLOVE ECLIPSE 8.0 STRL XLNG CF (GLOVE) ×1 IMPLANT
GOWN STRL REUS W/ TWL LRG LVL3 (GOWN DISPOSABLE) ×2 IMPLANT
KIT BASIN OR (CUSTOM PROCEDURE TRAY) ×1 IMPLANT
KIT TURNOVER KIT B (KITS) ×1 IMPLANT
NS IRRIG 1000ML POUR BTL (IV SOLUTION) ×1 IMPLANT
PACK GENERAL/GYN (CUSTOM PROCEDURE TRAY) ×1 IMPLANT
PAD ARMBOARD POSITIONER FOAM (MISCELLANEOUS) ×1 IMPLANT
PENCIL SMOKE EVACUATOR (MISCELLANEOUS) ×1 IMPLANT
SWAB COLLECTION DEVICE MRSA (MISCELLANEOUS) IMPLANT
SWAB CULTURE ESWAB REG 1ML (MISCELLANEOUS) IMPLANT
TAPE PAPER 3X10 WHT MICROPORE (GAUZE/BANDAGES/DRESSINGS) IMPLANT
TOWEL GREEN STERILE (TOWEL DISPOSABLE) ×1 IMPLANT
TOWEL GREEN STERILE FF (TOWEL DISPOSABLE) ×1 IMPLANT

## 2023-10-14 NOTE — Discharge Instructions (Signed)
 Go to the ED for evaluation.

## 2023-10-14 NOTE — Op Note (Signed)
   Darlene Rodriguez 10/14/2023   Pre-op Diagnosis:LEFT BACK ABSCESS     Post-op Diagnosis: same  Procedure(s): INCISION AND DRAINAGE LEFT BACK ABSCESS  Surgeon(s): Vernetta Berg, MD  Anesthesia: General  Staff:  Circulator: Lawernce Suzen LABOR, RN; Jones Cornwall, RN Scrub Person: Primus Leita CHRISTELLA, RN  Estimated Blood Loss: Minimal               Specimens:  cultures sent  Findings: The patient had a large, simple right back abscess.  There was no evidence of a necrotizing soft tissue infection.  Procedure: The patient was brought to the operating and identified the correct patient.  She was placed upon the operating room table and general anesthesia was induced.  The patient was then turned to the right lateral decubitus position.  Her left back was then prepped and draped in the usual sterile fashion.  Using #15 blade I made a transverse incision across the area of the abscess with a scalpel.  I entered an abscess cavity and purulence was drained and cultures were sent.  It was a moderately large abscess cavity.  I probed in multiple directions and did not extend further.  There was no evidence of necrosis.  I irrigated the wound with normal saline.  Hemostasis was achieved with the cautery.  I injected Marcaine  circumferentially into the wound.  I then packed it with a 3 inch Kling gauze soaked in saline.  Dry gauze and ABDs were then placed over this.  The patient tolerated the procedure well.  All the counts were correct at the end of the procedure.  The patient was then turned back into the supine position.  She was then extubated in the operating room and taken in a stable condition to the recovery room.          Berg Vernetta   Date: 10/14/2023  Time: 5:35 PM

## 2023-10-14 NOTE — Anesthesia Preprocedure Evaluation (Addendum)
 Anesthesia Evaluation  Patient identified by MRN, date of birth, ID band Patient awake    Reviewed: Allergy & Precautions, NPO status , Patient's Chart, lab work & pertinent test results  History of Anesthesia Complications Negative for: history of anesthetic complications  Airway Mallampati: II  TM Distance: >3 FB Neck ROM: Full    Dental  (+) Dental Advisory Given   Pulmonary neg pulmonary ROS   Pulmonary exam normal        Cardiovascular hypertension, Pt. on medications Normal cardiovascular exam     Neuro/Psych negative neurological ROS  negative psych ROS   GI/Hepatic negative GI ROS, Neg liver ROS,,,  Endo/Other  diabetes, Type 2  Class 4 obesity Pre-DM   Renal/GU negative Renal ROS     Musculoskeletal negative musculoskeletal ROS (+)    Abdominal  (+) + obese  Peds  Hematology negative hematology ROS (+)   Anesthesia Other Findings   Reproductive/Obstetrics                              Anesthesia Physical Anesthesia Plan  ASA: 3  Anesthesia Plan: General   Post-op Pain Management: Tylenol  PO (pre-op)* and Toradol  IV (intra-op)*   Induction: Intravenous  PONV Risk Score and Plan: 3 and Treatment may vary due to age or medical condition, Ondansetron , Dexamethasone  and Midazolam   Airway Management Planned: Oral ETT  Additional Equipment: None  Intra-op Plan:   Post-operative Plan: Extubation in OR  Informed Consent: I have reviewed the patients History and Physical, chart, labs and discussed the procedure including the risks, benefits and alternatives for the proposed anesthesia with the patient or authorized representative who has indicated his/her understanding and acceptance.     Dental advisory given  Plan Discussed with: CRNA and Anesthesiologist  Anesthesia Plan Comments:          Anesthesia Quick Evaluation

## 2023-10-14 NOTE — H&P (Signed)
 History and Physical    Patient: Darlene Rodriguez FMW:[id number] DOB: November 25, 1980 DOA: 10/14/2023 DOS: the patient was seen and examined on 10/14/2023 PCP: Patient, No Pcp Per  Patient coming from: Home  Chief Complaint:  Chief Complaint  Patient presents with   Abscess   HPI: Darlene Rodriguez is a 43 y.o. female with PMH of HTN, morbid obesity and DM-2 not on meds sent to ED from urgent care due to did not respond to p.o. doxycycline  outpatient.  Patient reports poison ivy for which she was treated with a steroid on 10/03/2023.  She was also prescribed Augmentin  due to concern for infected poison oak.  She finished 7 days of Augmentin  and prednisone .  Then she developed hard painful area on the left medial and lower back that it was hot and tender to touch.  She returned to urgent care on 9/5, and was prescribed doxycycline .  Her abscess on her back gotten worse despite taking antibiotics, and she returned to urgent care for evaluation.  After attempted and unsuccessful needle aspiration in urgent care, patient was sent to ED for further evaluation.  In ED, tachycardic to 110s.  BP elevated to 167/121 but improved to 138/95.  WBC 18.9 with left shift.  Lactic acid 1.9.  Glucose 285.  Not in DKA or HHS.  Pregnancy test negative.  RVP negative.  CT abdomen and pelvis concerning for cellulitis and neck fasciitis and left middle back.  CT also showed large infraumbilical hernia without incarceration.  Blood cultures obtained.  Received 1 L LR bolus, ceftriaxone , clindamycin  and vancomycin .  General surgery consulted and she had I&D.   Evaluated patient in PACU after I&D.  She is complaining of severe pain at surgical site.  She is somewhat tearful.  Denies ever having fever.  Confirms completing 7 days of Augmentin  and taking doxycycline  for 3 days.   She is not taking any medication for diabetes.  Previously took metformin  but stopped due to low glucose levels.  Confirms taking lisinopril  10 mg  daily. Denies history of heart disease, OSA and lung disease.  Reports hives with Benadryl.  Patient lives with husband.  Independently ambulates at baseline.  Denies smoking cigarettes, drinking alcohol recreational drug use.  She is interested in cardiopulmonary resuscitation in the event of sudden cardiopulmonary arrest.     Review of Systems: As mentioned in the history of present illness. All other systems reviewed and are negative. Past Medical History:  Diagnosis Date   Diabetes (HCC)    Hypertension    Past Surgical History:  Procedure Laterality Date   CESAREAN SECTION     TONSILLECTOMY     Social History:  reports that she has never smoked. She has never used smokeless tobacco. She reports that she does not drink alcohol and does not use drugs.  Allergies  Allergen Reactions   Diphenhydramine Shortness Of Breath    Family History  Problem Relation Age of Onset   Lung cancer Father    Heart disease Maternal Grandfather     Prior to Admission medications   Medication Sig Start Date End Date Taking? Authorizing Provider  doxycycline  (VIBRAMYCIN ) 100 MG capsule Take 1 capsule (100 mg total) by mouth 2 (two) times daily for 7 days. 10/11/23 10/18/23  Rising, Asberry, PA-C  ibuprofen  (ADVIL ) 800 MG tablet Take 1 tablet (800 mg total) by mouth 3 (three) times daily. 10/11/23   Rising, Asberry, PA-C  lisinopril  (ZESTRIL ) 10 MG tablet Take 10 mg by mouth daily. 12/27/20  [provider]  predniSONE  (STERAPRED UNI-PAK 48 TAB) 10 MG (48) TBPK tablet Take as directed. 10/03/23   Rolinda Rogue, MD    Physical Exam: Vitals:   10/14/23 1800 10/14/23 1815 10/14/23 1830 10/14/23 1845  BP: (!) 114/100 116/84 (!) 141/78 114/87  Pulse: 100 99 (!) 101 (!) 107  Resp: 15 14 13 19   Temp:      TempSrc:      SpO2: 92% 94% 93% 100%  Weight:      Height:       GENERAL: Seems to have significant pain at surgical site.  Tearful. HEENT: MMM.  Vision and hearing grossly intact.   NECK: Supple.  No apparent JVD.  RESP:  No IWOB.  Fair aeration bilaterally. CVS: HR 100s.  Heart sounds normal.  ABD/GI/GU: BS+. Abd soft, NTND.  MSK/EXT:   No apparent deformity. Moves extremities. No edema.  SKIN: Dressing over surgical site NEURO: Awake and alert. Oriented appropriately.  No apparent focal neuro deficit. PSYCH: Somewhat tearful  Data Reviewed: See HPI  Assessment and Plan: Sepsis due to back cellulitis with abscess: Present on arrival.  Failed outpatient antibiotic treatment with 7 days of Augmentin  and 3 days of doxycycline .  CT abdomen and pelvis raises concern for cellulitis and neck fasciitis. -S/p I&D by general surgery.  Neck fasciitis ruled out. -Continue vancomycin , ceftriaxone  and Flagyl -Follow-up blood cultures  Uncontrolled DM-2 with hyperglycemia: A1c 8.2% in 2022. Recent Labs  Lab 10/14/23 1641 10/14/23 1757  GLUCAP 213* 184*  -Recheck hemoglobin A1c -SSI-moderate -Semglee  15 units daily  Essential hypertension: BP elevated. - Continue home lisinopril  after med rec - IV labetalol  as needed  Large infraumbilical abdominal hernia: No incarceration.  Leukocytosis/bandemia: Likely due to #1 - Antibiotics as above - Continue monitoring  Morbid obesity Body mass index is 54.93 kg/m.        Advance Care Planning:   Code Status: Full Code -discussed with patient.  Consults: General Surgery  Family Communication: None at the bedside.  Severity of Illness: The appropriate patient status for this patient is INPATIENT. Inpatient status is judged to be reasonable and necessary in order to provide the required intensity of service to ensure the patient's safety. The patient's presenting symptoms, physical exam findings, and initial radiographic and laboratory data in the context of their chronic comorbidities is felt to place them at high risk for further clinical deterioration. Furthermore, it is not anticipated that the patient will be  medically stable for discharge from the hospital within 2 midnights of admission.   * I certify that at the point of admission it is my clinical judgment that the patient will require inpatient hospital care spanning beyond 2 midnights from the point of admission due to high intensity of service, high risk for further deterioration and high frequency of surveillance required.*  Author: Jamesrobert Ohanesian T Lamona Eimer, MD 10/14/2023 6:48 PM  For on call review [email or web address].

## 2023-10-14 NOTE — Transfer of Care (Signed)
 Immediate Anesthesia Transfer of Care Note  Patient: Darlene Rodriguez  Procedure(s) Performed: IRRIGATION AND DEBRIDEMENT WOUND  Patient Location: PACU  Anesthesia Type:General  Level of Consciousness: awake, alert , and oriented  Airway & Oxygen Therapy: Patient Spontanous Breathing and Patient connected to nasal cannula oxygen  Post-op Assessment: Report given to RN and Post -op Vital signs reviewed and stable  Post vital signs: Reviewed and stable  Last Vitals:  Vitals Value Taken Time  BP 114/100 10/14/23 18:00  Temp 98.6   Pulse 106 10/14/23 18:02  Resp 11 10/14/23 18:02  SpO2 95 % 10/14/23 18:02  Vitals shown include unfiled device data.  Last Pain:  Vitals:   10/14/23 1800  TempSrc:   PainSc: 10-Worst pain ever         Complications: No notable events documented.

## 2023-10-14 NOTE — ED Triage Notes (Signed)
 Patient  states she was at [facility] earlier and was seen to ED for further eval, questionable cellulitis vis abscess on her back

## 2023-10-14 NOTE — Anesthesia Procedure Notes (Signed)
 Procedure Name: Intubation Date/Time: 10/14/2023 5:26 PM  Performed by: Scherrie Mast, CRNAPre-anesthesia Checklist: Patient identified, Emergency Drugs available, Suction available and Patient being monitored Patient Re-evaluated:Patient Re-evaluated prior to induction Oxygen Delivery Method: Circle System Utilized Preoxygenation: Pre-oxygenation with 100% oxygen Induction Type: IV induction Ventilation: Mask ventilation without difficulty Laryngoscope Size: Mac, Glidescope and 4 Grade View: Grade I Tube type: Oral Tube size: 7.0 mm Number of attempts: 1 Airway Equipment and Method: Stylet and Oral airway Placement Confirmation: ETT inserted through vocal cords under direct vision, positive ETCO2 and breath sounds checked- equal and bilateral Secured at: 21 cm Tube secured with: Tape Dental Injury: Teeth and Oropharynx as per pre-operative assessment

## 2023-10-14 NOTE — Progress Notes (Signed)
 Pharmacy Antibiotic Note  Darlene Rodriguez is a 43 y.o. female admitted on 10/14/2023 with cellulitis.  Pharmacy has been consulted for Vancomycin  dosing.  Plan: Vancomycin  1500 mg iv x 1 now (to equal 2500 mg for today) then 1250 Q 12 hours (AUC of 502 using Scr =0.8)  Follow cultures, progress, Scr  Height: 5' 4 (162.6 cm) Weight: (!) 145.2 kg (320 lb) IBW/kg (Calculated) : 54.7  Temp (24hrs), Avg:98.2 F (36.8 C), Min:97.5 F (36.4 C), Max:98.6 F (37 C)  Recent Labs  Lab 10/14/23 1124 10/14/23 1132  WBC 18.9*  --   CREATININE 0.58  --   LATICACIDVEN  --  1.9    Estimated Creatinine Clearance: 128.8 mL/min (by C-G formula based on SCr of 0.58 mg/dL).    Allergies  Allergen Reactions   Diphenhydramine Shortness Of Breath      Thank you for allowing pharmacy to be a part of this patient's care.  Perri Olam Murray 10/14/2023 7:51 PM

## 2023-10-14 NOTE — ED Provider Notes (Signed)
 [place] EMERGENCY DEPARTMENT AT [facility] Provider Note   CSN: [id number] Arrival date & time: 10/14/23  1113     Patient presents with: Abscess   Darlene Rodriguez is a 43 y.o. female.    Abscess  Patient is a 43 year old female with past medical history significant for hypertension, diabetes  Patient presents emergency room today with complaints of redness swelling and pain to her low back since August 28.  She states that initially she suspect that this was due to poison ivy.  She states that she was initially given Augmentin  and prednisone  to cover for secondary infection/cellulitis as well as poison ivy dermatitis.  She states she had some improvement in her rash and pain however states that on September 3 she started having worsening pain and return of rash and redness went to urgent care and was started on doxycycline  she has taken this twice daily as prescribed until today when she returned to urgent care because of worsening pain and swelling.      Prior to Admission medications   Medication Sig Start Date End Date Taking? Authorizing Provider  doxycycline  (VIBRAMYCIN ) 100 MG capsule Take 1 capsule (100 mg total) by mouth 2 (two) times daily for 7 days. 10/11/23 10/18/23  Rising, Asberry, PA-C  ibuprofen  (ADVIL ) 800 MG tablet Take 1 tablet (800 mg total) by mouth 3 (three) times daily. 10/11/23   Rising, Asberry, PA-C  lisinopril  (ZESTRIL ) 10 MG tablet Take 10 mg by mouth daily. 12/27/20   [provider]  predniSONE  (STERAPRED UNI-PAK 48 TAB) 10 MG (48) TBPK tablet Take as directed. 10/03/23   Rolinda Rogue, MD    Allergies: Diphenhydramine    Review of Systems  Updated Vital Signs BP (!) 138/95   Pulse (!) 105   Temp 98.2 F (36.8 C) (Oral)   Resp 13   Ht 5' 4 (1.626 m)   LMP 10/01/2023 (Exact Date)   SpO2 100%   BMI 56.64 kg/m   Physical Exam Vitals and nursing note reviewed.  Constitutional:      General: She is not in acute  distress. HENT:     Head: Normocephalic and atraumatic.     Nose: Nose normal.  Eyes:     General: No scleral icterus. Cardiovascular:     Rate and Rhythm: Normal rate and regular rhythm.     Pulses: Normal pulses.     Heart sounds: Normal heart sounds.  Pulmonary:     Effort: Pulmonary effort is normal. No respiratory distress.     Breath sounds: No wheezing.  Abdominal:     Palpations: Abdomen is soft.     Tenderness: There is no abdominal tenderness.  Musculoskeletal:     Cervical back: Normal range of motion.     Right lower leg: No edema.     Left lower leg: No edema.  Skin:    General: Skin is warm and dry.     Capillary Refill: Capillary refill takes less than 2 seconds.     Findings: Erythema present.     Comments: Large band of erythematous skin to the low back with some indurated and very tender skin.  Warm to touch with erythema wrapping left towards umbilicus.  Neurological:     Mental Status: She is alert. Mental status is at baseline.  Psychiatric:        Mood and Affect: Mood normal.        Behavior: Behavior normal.     (all labs ordered are  listed, but only abnormal results are displayed) Labs Reviewed  COMPREHENSIVE METABOLIC PANEL WITH GFR - Abnormal; Notable for the following components:      Result Value   Glucose, Bld 285 (*)    Albumin 3.0 (*)    AST 11 (*)    All other components within normal limits  CBC WITH DIFFERENTIAL/PLATELET - Abnormal; Notable for the following components:   WBC 18.9 (*)    Neutro Abs 14.8 (*)    Monocytes Absolute 1.2 (*)    Eosinophils Absolute 0.6 (*)    Abs Immature Granulocytes 0.21 (*)    All other components within normal limits  PROTIME-INR - Abnormal; Notable for the following components:   Prothrombin Time 15.5 (*)    All other components within normal limits  RESP PANEL BY RT-PCR (RSV, FLU A&B, COVID)  RVPGX2  CULTURE, BLOOD (ROUTINE X 2)  CULTURE, BLOOD (ROUTINE X 2)  HCG, SERUM, QUALITATIVE  I-STAT  CG4 LACTIC ACID, ED    EKG: None  Radiology: CT ABDOMEN PELVIS W CONTRAST Result Date: 10/14/2023 CLINICAL DATA:  Back pain, cellulitis EXAM: CT ABDOMEN AND PELVIS WITH CONTRAST TECHNIQUE: Multidetector CT imaging of the abdomen and pelvis was performed using the standard protocol following bolus administration of intravenous contrast. RADIATION DOSE REDUCTION: This exam was performed according to the departmental dose-optimization program which includes automated exposure control, adjustment of the mA and/or kV according to patient size and/or use of iterative reconstruction technique. CONTRAST:  75mL OMNIPAQUE  IOHEXOL  350 MG/ML SOLN COMPARISON:  04/03/2020 FINDINGS: Lower chest: No acute pleural or parenchymal lung disease. Hepatobiliary: No focal liver abnormality is seen. Status post cholecystectomy. No biliary dilatation. Pancreas: Unremarkable. No pancreatic ductal dilatation or surrounding inflammatory changes. Spleen: Normal in size without focal abnormality. Adrenals/Urinary Tract: Adrenal glands are unremarkable. Kidneys are normal, without renal calculi, focal lesion, or hydronephrosis. Bladder is unremarkable. Stomach/Bowel: No bowel obstruction or ileus. Normal appendix right lower quadrant. No bowel wall thickening or inflammatory change. Large infraumbilical ventral hernia containing mid transverse colon and distal small bowel. No incarceration or obstruction. Vascular/Lymphatic: No significant vascular findings are present. No enlarged abdominal or pelvic lymph nodes. Reproductive: Uterus and bilateral adnexa are unremarkable. Other: No free fluid or free intraperitoneal gas. Large infraumbilical midline ventral hernia containing portions of the mid transverse colon and distal small bowel. No incarceration, obstruction, or ischemia. Musculoskeletal: There is extensive subcutaneous fat stranding within the soft tissues of the lower back, greatest to the left of midline at the L2 level. Within  the subcutaneous fat stranding in this region there are multiple punctate foci of gas, concerning for infection with gas forming organism. No fluid collection or drainable abscess. No acute or destructive bony abnormalities. Reconstructed images demonstrate no additional findings. IMPRESSION: 1. Extensive cellulitis within the lower back, with punctate foci of gas within the region of inflammation in the left lower back consistent with infection by gas-forming organism or necrotizing fasciitis. 2. Large infraumbilical midline ventral hernia containing portions of the transverse colon and small bowel. No incarceration, obstruction, or ischemia. Electronically Signed   By: Ozell Daring M.D.   On: 10/14/2023 15:25     .Critical Care  Performed by: Neldon Hamp RAMAN, PA Authorized by: Neldon Hamp RAMAN, PA   Critical care provider statement:    Critical care time (minutes):  35   Critical care time was exclusive of:  Separately billable procedures and treating other patients and teaching time   Critical care was necessary to treat or prevent  imminent or life-threatening deterioration of the following conditions:  Sepsis   Critical care was time spent personally by me on the following activities:  Development of treatment plan with patient or surrogate, review of old charts, re-evaluation of patient's condition, pulse oximetry, ordering and review of radiographic studies, ordering and review of laboratory studies, ordering and performing treatments and interventions, obtaining history from patient or surrogate, examination of patient and evaluation of patient's response to treatment   Care discussed with: admitting provider      Medications Ordered in the ED  lactated ringers  infusion ( Intravenous New Bag/Given 10/14/23 1301)  cefTRIAXone  (ROCEPHIN ) 2 g in sodium chloride  0.9 % 100 mL IVPB (0 g Intravenous Stopped 10/14/23 1303)  vancomycin  (VANCOCIN ) IVPB 1000 mg/200 mL premix (0 mg Intravenous Stopped  10/14/23 1329)  lactated ringers  bolus 1,000 mL (1,000 mLs Intravenous New Bag/Given 10/14/23 1302)  oxyCODONE -acetaminophen  (PERCOCET/ROXICET) 5-325 MG per tablet 2 tablet (2 tablets Oral Given 10/14/23 1211)  iohexol  (OMNIPAQUE ) 350 MG/ML injection 75 mL (75 mLs Intravenous Contrast Given 10/14/23 1508)    Clinical Course as of 10/14/23 1612  Mon Oct 14, 2023  1155 28th redness and swelling and went to UC and prednisone  and augmentin  (covering poison ivy and celluitis).  Improved and then Wednesday started having bumps and more pain. Friday more pain -- put on doxycycline . [WF]  1549 Presenting with c/f necrotizing infection with soft tissue gas. LRNIC score of 2. Low back [AF]  1558 Dr. Kathrin - secure chat if surgery doesn't want to admit [WF]    Clinical Course User Index [AF] Dionisio Blunt, MD [WF] Neldon Hamp RAMAN, [place]                                 Medical Decision Making Amount and/or Complexity of Data Reviewed Labs: ordered. Radiology: ordered.  Risk Prescription drug management.   This patient presents to the ED for concern of wound, this involves a number of treatment options, and is a complaint that carries with it a moderate to high risk of complications and morbidity. A differential diagnosis was considered for the patient's symptoms which is discussed below:   Cellulitis, abscess, necrotizing fasciitis   Co morbidities: Discussed in HPI   Brief History:  Patient is a 43 year old female with past medical history significant for hypertension, diabetes  Patient presents emergency room today with complaints of redness swelling and pain to her low back since August 28.  She states that initially she suspect that this was due to poison ivy.  She states that she was initially given Augmentin  and prednisone  to cover for secondary infection/cellulitis as well as poison ivy dermatitis.  She states she had some improvement in her rash and pain however states that on  September 3 she started having worsening pain and return of rash and redness went to urgent care and was started on doxycycline  she has taken this twice daily as prescribed until today when she returned to urgent care because of worsening pain and swelling.        EMR reviewed including pt PMHx, past surgical history and past visits to ER.   See HPI for more details   Lab Tests:   Leukocytosis of 18.9, left shift present, hyperglycemia, blood cultures obtained initial lactate 1.9   Imaging Studies:  Abnormal findings. I personally reviewed all imaging studies. Imaging notable for Concerning for necrotizing fasciitis versus residual gas from needle aspiration  attempt earlier   Cardiac Monitoring:  The patient was maintained on a cardiac monitor.  I personally viewed and interpreted the cardiac monitored which showed an underlying rhythm of: As tachycardia EKG non-ischemic   Medicines ordered:  I ordered medication including lactated Ringer 's, Rocephin , vancomycin  for antibiosis Reevaluation of the patient after these medicines showed that the patient improved I have reviewed the patients home medicines and have made adjustments as needed   Critical Interventions:   fluids and antibiotics   Consults/Attending Physician   I discussed this case with my attending physician who cosigned this note including patient's presenting symptoms, physical exam, and planned diagnostics and interventions. Attending physician stated agreement with plan or made changes to plan which were implemented.   Reevaluation:  After the interventions noted above I re-evaluated patient and found that they have :improved   Social Determinants of Health:      Problem List / ED Course:  Patient with septic presentation of tachycardia leukocytosis and large wound to low back.  I specifically did not provide 30 mL/kg dose of fluids that this would be nearly 5 L of fluid for this patient.  She  received broad-spectrum antibiotics, general surgery has evaluated patient and will take 2 OR for debridement.  Admitted to hospitalist service under Dr. Kathrin.   Dispostion:  After consideration of the diagnostic results and the patients response to treatment, I feel that the patent would benefit from admission.    Final diagnoses:  Cellulitis of back except buttock  Sepsis, due to unspecified organism, unspecified whether acute organ dysfunction present [facility])    ED Discharge Orders     None          Neldon Hamp RAMAN, [place] 10/14/23 1616    Armenta Canning, MD 10/14/23 1649

## 2023-10-14 NOTE — Consult Note (Signed)
 Consult Note  Darlene Rodriguez 1981/01/09  [id number].    Requesting MD: Ludivina Shines, MD Chief Complaint/Reason for Consult: NSTI of back   HPI:  Patient is a 43 year old female with PMH significant for HTN and T2DM. She was seen initially in urgent care 8/28 with concern for infected poison oak on her back and prescribed Augmentin  and prednisone . She returned to urgent care 9/5 with 2 days of pain in same area that felt firm and hot to the touch. The rash had resolved at that time. She was noted to have a 3 in x 3 in area of fluctuance with induration and erythema but this was not drained. She was prescribed 7 days of doxycycline  and recommended to do warm compresses. Returned to urgent care today, where she says they numbed the area and attempted aspiration without success, and was referred to ED for evaluation of worsening cellulitis. Pain and swelling have worsened but she denies fever or chills. Allergy listed to benadryl with reaction of SOB. Not on blood thinners. She is not on medications for her diabetes. 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   ROS Negative other than HPI  Family History  Problem Relation Age of Onset   Lung cancer Father    Heart disease Maternal Grandfather     Past Medical History:  Diagnosis Date   Diabetes (HCC)    Hypertension     Past Surgical History:  Procedure Laterality Date   CESAREAN SECTION     TONSILLECTOMY      Social History:  reports that she has never smoked. She has never used smokeless tobacco. She reports that she does not drink alcohol and does not use drugs.  Allergies:  Allergies  Allergen Reactions   Diphenhydramine Shortness Of Breath    (Not in a hospital admission)   Blood pressure (!) 138/95, pulse (!) 105, temperature 98.2 F (36.8 C), temperature source Oral, resp. rate 13, height 5' 4 (1.626 m), last menstrual period 10/01/2023, SpO2 100%. Physical Exam:  General:  pleasant, WD, morbidly obese female who is laying in bed in NAD HEENT: head is normocephalic, atraumatic.  Sclera are noninjected.  PERRL.  Ears and nose without any masses or lesions.  Mouth is pink and moist Heart: regular, rate, and rhythm.  Normal s1,s2. No obvious murmurs, gallops, or rubs noted.  Palpable radial and pedal pulses bilaterally Lungs: CTAB, no wheezes, rhonchi, or rales noted.  Respiratory effort nonlabored Abd: soft, NT, ND, +BS, no masses, hernias, or organomegaly Back: large area of induration and erythema with fullness and central fluctuance; exquisitely tender. No crepitus  MS: all 4 extremities are symmetrical with no cyanosis, clubbing, or edema. Skin: warm and dry with no masses, lesions, or rashes Neuro: Cranial nerves 2-12 grossly intact, sensation is normal throughout Psych: A&Ox3 with an appropriate affect.   Results for orders placed or performed during the hospital encounter of 10/14/23 (from the past 48 hours)  Comprehensive metabolic panel     Status: Abnormal   Collection Time: 10/14/23 11:24 AM  Result Value Ref Range   Sodium 136 135 - 145 mmol/L   Potassium 3.6 3.5 - 5.1 mmol/L   Chloride 101 98 - 111 mmol/L   CO2 23 22 - 32 mmol/L   Glucose, Bld 285 (H) 70 - 99 mg/dL    Comment: Glucose reference range applies only to samples taken after fasting for at least 8 hours.  BUN 10 6 - 20 mg/dL   Creatinine, Ser 9.41 0.44 - 1.00 mg/dL   Calcium 9.3 8.9 - 89.6 mg/dL   Total Protein 7.1 6.5 - 8.1 g/dL   Albumin 3.0 (L) 3.5 - 5.0 g/dL   AST 11 (L) 15 - 41 U/L   ALT 12 0 - 44 U/L   Alkaline Phosphatase 98 38 - 126 U/L   Total Bilirubin 0.9 0.0 - 1.2 mg/dL   GFR, Estimated >39 >39 mL/min    Comment: (NOTE) Calculated using the CKD-EPI Creatinine Equation (2021)    Anion gap 12 5 - 15    Comment: Performed at [facility], [address], [place], [place] [address]  CBC with Differential     Status: Abnormal   Collection Time: 10/14/23 11:24 AM   Result Value Ref Range   WBC 18.9 (H) 4.0 - 10.5 K/uL   RBC 4.84 3.87 - 5.11 MIL/uL   Hemoglobin 14.5 12.0 - 15.0 g/dL   HCT 56.2 63.9 - 53.9 %   MCV 90.3 80.0 - 100.0 fL   MCH 30.0 26.0 - 34.0 pg   MCHC 33.2 30.0 - 36.0 g/dL   RDW 87.2 88.4 - 84.4 %   Platelets 357 150 - 400 K/uL   nRBC 0.0 0.0 - 0.2 %   Neutrophils Relative % 78 %   Neutro Abs 14.8 (H) 1.7 - 7.7 K/uL   Lymphocytes Relative 10 %   Lymphs Abs 1.9 0.7 - 4.0 K/uL   Monocytes Relative 7 %   Monocytes Absolute 1.2 (H) 0.1 - 1.0 K/uL   Eosinophils Relative 3 %   Eosinophils Absolute 0.6 (H) 0.0 - 0.5 K/uL   Basophils Relative 1 %   Basophils Absolute 0.1 0.0 - 0.1 K/uL   Immature Granulocytes 1 %   Abs Immature Granulocytes 0.21 (H) 0.00 - 0.07 K/uL    Comment: Performed at [facility], [address] [place], [place] [address]  hCG, serum, qualitative     Status: None   Collection Time: 10/14/23 11:24 AM  Result Value Ref Range   Preg, Serum NEGATIVE NEGATIVE    Comment:        THE SENSITIVITY OF THIS METHODOLOGY IS >10 mIU/mL. Performed at [facility], [address], [place], [place] [address]   I-Stat Lactic Acid, ED     Status: None   Collection Time: 10/14/23 11:32 AM  Result Value Ref Range   Lactic Acid, Venous 1.9 0.5 - 1.9 mmol/L  Protime-INR     Status: Abnormal   Collection Time: 10/14/23 12:15 PM  Result Value Ref Range   Prothrombin Time 15.5 (H) 11.4 - 15.2 seconds   INR 1.2 0.8 - 1.2    Comment: (NOTE) INR goal varies based on device and disease states. Performed at [facility], [address], [place], [place] [address]   Resp panel by RT-PCR (RSV, Flu A&B, Covid) Anterior Nasal Swab     Status: None   Collection Time: 10/14/23 12:21 PM   Specimen: Anterior Nasal Swab  Result Value Ref Range   SARS Coronavirus 2 by RT PCR NEGATIVE NEGATIVE   Influenza A by PCR NEGATIVE NEGATIVE   Influenza B by PCR NEGATIVE NEGATIVE    Comment: (NOTE) The Xpert Xpress  SARS-CoV-2/FLU/RSV plus assay is intended as an aid in the diagnosis of influenza from Nasopharyngeal swab specimens and should not be used as a sole basis for treatment. Nasal washings and aspirates  are unacceptable for Xpert Xpress SARS-CoV-2/FLU/RSV testing.  Fact Sheet for Patients: BloggerCourse.com  Fact Sheet for Healthcare Providers: SeriousBroker.it  This test is not yet approved or cleared by the [place]  FDA and has been authorized for detection and/or diagnosis of SARS-CoV-2 by FDA under an Emergency Use Authorization (EUA). This EUA will remain in effect (meaning this test can be used) for the duration of the COVID-19 declaration under Section 564(b)(1) of the Act, 21 U.S.C. section 360bbb-3(b)(1), unless the authorization is terminated or revoked.     Resp Syncytial Virus by PCR NEGATIVE NEGATIVE    Comment: (NOTE) Fact Sheet for Patients: BloggerCourse.com  Fact Sheet for Healthcare Providers: SeriousBroker.it  This test is not yet approved or cleared by the [place]  FDA and has been authorized for detection and/or diagnosis of SARS-CoV-2 by FDA under an Emergency Use Authorization (EUA). This EUA will remain in effect (meaning this test can be used) for the duration of the COVID-19 declaration under Section 564(b)(1) of the Act, 21 U.S.C. section 360bbb-3(b)(1), unless the authorization is terminated or revoked.  Performed at [facility], [address] [place], [place] [address]    CT ABDOMEN PELVIS W CONTRAST Result Date: 10/14/2023 CLINICAL DATA:  Back pain, cellulitis EXAM: CT ABDOMEN AND PELVIS WITH CONTRAST TECHNIQUE: Multidetector CT imaging of the abdomen and pelvis was performed using the standard protocol following bolus administration of intravenous contrast. RADIATION DOSE REDUCTION: This exam was performed according to the  departmental dose-optimization program which includes automated exposure control, adjustment of the mA and/or kV according to patient size and/or use of iterative reconstruction technique. CONTRAST:  75mL OMNIPAQUE  IOHEXOL  350 MG/ML SOLN COMPARISON:  04/03/2020 FINDINGS: Lower chest: No acute pleural or parenchymal lung disease. Hepatobiliary: No focal liver abnormality is seen. Status post cholecystectomy. No biliary dilatation. Pancreas: Unremarkable. No pancreatic ductal dilatation or surrounding inflammatory changes. Spleen: Normal in size without focal abnormality. Adrenals/Urinary Tract: Adrenal glands are unremarkable. Kidneys are normal, without renal calculi, focal lesion, or hydronephrosis. Bladder is unremarkable. Stomach/Bowel: No bowel obstruction or ileus. Normal appendix right lower quadrant. No bowel wall thickening or inflammatory change. Large infraumbilical ventral hernia containing mid transverse colon and distal small bowel. No incarceration or obstruction. Vascular/Lymphatic: No significant vascular findings are present. No enlarged abdominal or pelvic lymph nodes. Reproductive: Uterus and bilateral adnexa are unremarkable. Other: No free fluid or free intraperitoneal gas. Large infraumbilical midline ventral hernia containing portions of the mid transverse colon and distal small bowel. No incarceration, obstruction, or ischemia. Musculoskeletal: There is extensive subcutaneous fat stranding within the soft tissues of the lower back, greatest to the left of midline at the L2 level. Within the subcutaneous fat stranding in this region there are multiple punctate foci of gas, concerning for infection with gas forming organism. No fluid collection or drainable abscess. No acute or destructive bony abnormalities. Reconstructed images demonstrate no additional findings. IMPRESSION: 1. Extensive cellulitis within the lower back, with punctate foci of gas within the region of inflammation in the left  lower back consistent with infection by gas-forming organism or necrotizing fasciitis. 2. Large infraumbilical midline ventral hernia containing portions of the transverse colon and small bowel. No incarceration, obstruction, or ischemia. Electronically Signed   By: Ozell Daring M.D.   On: 10/14/2023 15:25      Assessment/Plan Back abscess, cannot completely exclude NSTI but low suspicion based on history - CT today with extensive cellulitis of the lower back with punctate foci of gas concerning for NSTI,  left infraumbilical midline hernia without bowel incarceration or obstruction  - WBC 18.9, afebrile, tachycardic up to 120's during my exam, not hypotensive - recommend proceeding to OR for debridement tonight. Discussed with patient risk of need for return trips to OR, pain, wound care issues, poor cosmetic healing  - recommend medical admission   FEN: NPO for OR VTE: ok to start SQH or LMWH tomorrow if hgb stable  ID: rocephin /vanc  - per TRH -  HTN T2DM - A1c 8.2 in 2022 and 6.8 in 2023  I reviewed ED provider notes, last 24 h vitals and pain scores, last 48 h intake and output, last 24 h labs and trends, and last 24 h imaging results.  This care required high  level of medical decision making.   Darlene Rodriguez, [facility] 10/14/2023, 3:44 PM Please see Amion for pager number during day hours 7:00am-4:30pm

## 2023-10-14 NOTE — ED Triage Notes (Signed)
 Patient here today with c/o an abscess on the mid part of her low back since Wednesday of last week. Patient was here Friday and has started on Doxycycline  with no relief. Patient states that it feels like it has worsened.

## 2023-10-14 NOTE — ED Notes (Addendum)
 Patient is being discharged from the Urgent Care and sent to the Emergency Department via POV. Per Darice Showers, PA, patient is in need of higher level of care due to cellulitis of back. Patient is aware and verbalizes understanding of plan of care.  Vitals:   10/14/23 1006  BP: (!) 155/99  Pulse: (!) 114  Resp: 16  Temp: (!) 97.5 F (36.4 C)  SpO2: 97%

## 2023-10-14 NOTE — ED Triage Notes (Signed)
 Pt reports red, painful knot came up on her lower back last week, went to UC and was started on doxycycline . Pt also being treated for poison ivy with prednisone . Pt sent here for further evaluation of possible abscess.

## 2023-10-14 NOTE — ED Provider Notes (Signed)
 [facility]    CSN: [id number] Arrival date & time: 10/14/23  9046      History   Chief Complaint Chief Complaint  Patient presents with   Abscess    HPI Darlene Rodriguez is a 43 y.o. female.   Pt reports she has an infection her back.  Pt reports it started with poison ivy.  Pt states she was treated with steroids on 8/28.  Pt given doxycycline  for infection 3 days ago.  Pt reports infection is getting worse.  Pt reports area is red hot and painful.  Pt denies fever   The history is provided by the patient. No language interpreter was used.  Abscess Location:  Torso Torso abscess location:  Lower back Size:  30 Abscess quality: painful, redness and warmth   Progression:  Worsening Pain details:    Severity:  Moderate Context: diabetes     Past Medical History:  Diagnosis Date   Diabetes (HCC)    Hypertension     Patient Active Problem List   Diagnosis Date Noted   Essential hypertension 02/09/2014   Pre-diabetes 02/05/2014   Acute parotitis 02/02/2014   Morbid obesity (HCC) 02/02/2014    Past Surgical History:  Procedure Laterality Date   CESAREAN SECTION     TONSILLECTOMY      OB History   No obstetric history on file.      Home Medications    Prior to Admission medications   Medication Sig Start Date End Date Taking? Authorizing Provider  doxycycline  (VIBRAMYCIN ) 100 MG capsule Take 1 capsule (100 mg total) by mouth 2 (two) times daily for 7 days. 10/11/23 10/18/23  Rising, Asberry, PA-C  ibuprofen  (ADVIL ) 800 MG tablet Take 1 tablet (800 mg total) by mouth 3 (three) times daily. 10/11/23   Rising, Asberry, PA-C  lisinopril  (ZESTRIL ) 10 MG tablet Take 10 mg by mouth daily. 12/27/20   [provider]  predniSONE  (STERAPRED UNI-PAK 48 TAB) 10 MG (48) TBPK tablet Take as directed. 10/03/23   Rolinda Rogue, MD    Family History Family History  Problem Relation Age of Onset   Lung cancer Father    Heart disease Maternal Grandfather      Social History Social History   Tobacco Use   Smoking status: Never   Smokeless tobacco: Never  Vaping Use   Vaping status: Never Used  Substance Use Topics   Alcohol use: No   Drug use: No     Allergies   Diphenhydramine   Review of Systems Review of Systems  Skin:  Positive for wound.  All other systems reviewed and are negative.    Physical Exam Triage Vital Signs ED Triage Vitals [10/14/23 1006]  Encounter Vitals Group     BP (!) 155/99     Girls Systolic BP Percentile      Girls Diastolic BP Percentile      Boys Systolic BP Percentile      Boys Diastolic BP Percentile      Pulse Rate (!) 114     Resp 16     Temp (!) 97.5 F (36.4 C)     Temp Source Oral     SpO2 97 %     Weight      Height      Head Circumference      Peak Flow      Pain Score 6     Pain Loc      Pain Education  Exclude from Growth Chart    No data found.  Updated Vital Signs BP (!) 155/99 (BP Location: Left Arm)   Pulse (!) 114   Temp (!) 97.5 F (36.4 C) (Oral)   Resp 16   LMP 10/01/2023 (Exact Date)   SpO2 97%   Visual Acuity Right Eye Distance:   Left Eye Distance:   Bilateral Distance:    Right Eye Near:   Left Eye Near:    Bilateral Near:     Physical Exam Vitals and nursing note reviewed.  Constitutional:      Appearance: She is well-developed.  HENT:     Head: Normocephalic.  Cardiovascular:     Rate and Rhythm: Normal rate.  Pulmonary:     Effort: Pulmonary effort is normal.  Abdominal:     General: There is no distension.  Musculoskeletal:        General: Normal range of motion.     Cervical back: Normal range of motion.     Comments: Large area of redness, mid low back.  Redness around to side,  hard to touch, hot,  several blisters,  one area that looks like center pustule  Skin:    General: Skin is warm.  Neurological:     General: No focal deficit present.     Mental Status: She is alert and oriented to person, place, and time.       UC Treatments / Results  Labs (all labs ordered are listed, but only abnormal results are displayed) Labs Reviewed - No data to display  EKG   Radiology No results found.  Procedures Procedures (including critical care time)  Medications Ordered in UC Medications - No data to display  Initial Impression / Assessment and Plan / UC Course  I have reviewed the triage vital signs and the nursing notes.  Pertinent labs & imaging results that were available during my care of the patient were reviewed by me and considered in my medical decision making (see chart for details).     Pt may have a deep abscess,  I attempted needle aspiration, no drainage.  Pt advised to go to Emergency department for evaltuion.  Pt would benefit from labs and possible ultrasound to look for abscess,  concern for increasing cellulitis from previous visti Final Clinical Impressions(s) / UC Diagnoses   Final diagnoses:  Cellulitis of lower back     Discharge Instructions      Go to the ED for evaluation   ED Prescriptions   None    PDMP not reviewed this encounter. SABRAavs   Flint Sonny POUR, PA-C 10/14/23 1051

## 2023-10-14 NOTE — Sepsis Progress Note (Signed)
 Sepsis protocol is being followed by [facility].

## 2023-10-15 ENCOUNTER — Encounter (HOSPITAL_COMMUNITY): Payer: Self-pay | Admitting: Surgery

## 2023-10-15 DIAGNOSIS — I10 Essential (primary) hypertension: Secondary | ICD-10-CM

## 2023-10-15 DIAGNOSIS — E1165 Type 2 diabetes mellitus with hyperglycemia: Secondary | ICD-10-CM

## 2023-10-15 DIAGNOSIS — L0390 Cellulitis, unspecified: Secondary | ICD-10-CM

## 2023-10-15 LAB — BASIC METABOLIC PANEL WITH GFR
Anion gap: 10 (ref 5–15)
BUN: 6 mg/dL (ref 6–20)
CO2: 23 mmol/L (ref 22–32)
Calcium: 8.4 mg/dL — ABNORMAL LOW (ref 8.9–10.3)
Chloride: 99 mmol/L (ref 98–111)
Creatinine, Ser: 0.63 mg/dL (ref 0.44–1.00)
GFR, Estimated: >60 mL/min (ref >60)
Glucose, Bld: 279 mg/dL — ABNORMAL HIGH (ref 70–99)
Potassium: 3.9 mmol/L (ref 3.5–5.1)
Sodium: 132 mmol/L — ABNORMAL LOW (ref 135–145)

## 2023-10-15 LAB — CBC
HCT: 36 % (ref 36.0–46.0)
Hemoglobin: 11.9 g/dL — ABNORMAL LOW (ref 12.0–15.0)
MCH: 30.1 pg (ref 26.0–34.0)
MCHC: 33.1 g/dL (ref 30.0–36.0)
MCV: 90.9 fL (ref 80.0–100.0)
Platelets: 324 K/uL (ref 150–400)
RBC: 3.96 MIL/uL (ref 3.87–5.11)
RDW: 12.6 % (ref 11.5–15.5)
WBC: 17.5 K/uL — ABNORMAL HIGH (ref 4.0–10.5)
nRBC: 0 % (ref 0.0–0.2)

## 2023-10-15 LAB — GLUCOSE, CAPILLARY
Glucose-Capillary: 225 mg/dL — ABNORMAL HIGH (ref 70–99)
Glucose-Capillary: 197 mg/dL — ABNORMAL HIGH (ref 70–99)
Glucose-Capillary: 212 mg/dL — ABNORMAL HIGH (ref 70–99)
Glucose-Capillary: 215 mg/dL — ABNORMAL HIGH (ref 70–99)

## 2023-10-15 MED ORDER — INSULIN GLARGINE 100 UNIT/ML ~~LOC~~ SOLN
20.0000 [IU] | Freq: Every day | SUBCUTANEOUS | Status: DC
  Administered 2023-10-15 – 2023-10-18 (×4): 20 [IU] via SUBCUTANEOUS
  Filled 2023-10-15 (×4): qty 0.2

## 2023-10-15 MED ORDER — INSULIN ASPART 100 UNIT/ML IJ SOLN
5.0000 [IU] | Freq: Three times a day (TID) | INTRAMUSCULAR | Status: DC
  Administered 2023-10-15 – 2023-10-18 (×11): 5 [IU] via SUBCUTANEOUS

## 2023-10-15 NOTE — Progress Notes (Signed)
 PROGRESS NOTE  LUCEAL HOLLIBAUGH FMW:[id number] DOB: [birth year]   PCP: Patient, No Pcp Per  Patient is from: Home.  DOA: 10/14/2023 LOS: 1  Chief complaints Chief Complaint  Patient presents with   Abscess     Brief Narrative / Interim history: 43 y.o. female with PMH of HTN, morbid obesity and DM-2 not on meds sent to ED from urgent care due to left middle back abscess/cellulitis that did not improve with outpatient antibiotics including a week of Augmentin  and 4 days of doxycycline .  She was admitted with sepsis due to left buttock cellulitis/abscess.  CT abdomen and pelvis raised concern for cellulitis with possible necrotizing fasciitis/.  She was taken to the OR and underwent I&D with no significant output.  Necrotizing fasciitis ruled out.  Started on broad-spectrum antibiotics pending abscess culture.  Subjective: Seen and examined earlier this morning.  No major events overnight or this morning.  No major events overnight of this morning.  Continues to endorse significant pain at surgical site.  No other complaints.  Objective: Vitals:   10/14/23 2007 10/15/23 0016 10/15/23 0420 10/15/23 0914  BP: 139/89 133/83 137/77 (!) 153/96  Pulse: (!) 110 (!) 102 100 96  Resp: 18 18 18 16   Temp: 98.1 F (36.7 C) 98.5 F (36.9 C) 97.9 F (36.6 C) 98.4 F (36.9 C)  TempSrc: Oral Oral Oral Oral  SpO2: 97% 98% 98% 95%  Weight:      Height:        Examination:  GENERAL: No apparent distress.  Nontoxic. HEENT: MMM.  Vision and hearing grossly intact.  NECK: Supple.  No apparent JVD.  RESP:  No IWOB.  Fair aeration bilaterally. CVS:  RRR. Heart sounds normal.  ABD/GI/GU: BS+. Abd soft, NTND.  MSK/EXT:  Moves extremities. No apparent deformity. No edema.  SKIN: Dressing over left buttock DCI NEURO: AA.  Oriented appropriately.  No apparent focal neuro deficit. PSYCH: Calm. Normal affect.   Consultants:  General Surgery  Procedures: 9/8-I&D of left back abscess by Dr.  Vernetta  Microbiology summarized: 9/8-COVID-19, influenza and RSV PCR nonreactive 9/8-blood cultures NGTD 9/8-surgical tissue culture pending  Assessment and plan: Sepsis due to left back cellulitis with abscess: Present on arrival.  Failed outpatient antibiotic treatment with 7 days of Augmentin  and 3 days of doxycycline .  CT abdomen and pelvis raised concern for cellulitis and necrotizing fasciitis but the lady was ruled out after I&D.  Blood cultures NGTD.  Tissue culture pending. -S/p I&D by general surgery by Dr. Vernetta.  Necrotizing fasciitis ruled out. -Continue vancomycin  and ceftriaxone  pending abscess culture -Pain control per surgery. -SCD for VTE prophylaxis.  Will start subcu Lovenox when okay from surgical standpoint -Glycemic control.   Uncontrolled DM-2 with hyperglycemia: A1c 7.7% (was 8.2% in 2022).  Not on medication at home.  She says she stopped metformin  about a year ago due to low blood glucoses and 60s Recent Labs  Lab 10/14/23 1641 10/14/23 1757 10/14/23 2009 10/15/23 0643  GLUCAP 213* 184* 214* 225*  -Increase Semglee  from 15 to 20 units daily -Add NovoLog  5 units 3 times daily with meals -Continue SSI-moderate   Essential hypertension: Normotensive for most part. - Continue home lisinopril   - IV labetalol  as needed   Large infraumbilical abdominal hernia: No incarceration.   Leukocytosis/bandemia: Likely due to #1.  Improved. - Antibiotics as above - Continue monitoring  Hyponatremia: Mild - Continue monitoring   Morbid obesity Body mass index is 57.1 kg/m. -May benefit from GLP-1 agonist  and discussion of bariatric surgery          DVT prophylaxis:  SCDs Start: 10/14/23 1951  Code Status: Full code Family Communication: None at bedside Level of care: Telemetry Medical Status is: Inpatient Remains inpatient appropriate because: Left back abscess   Final disposition: Home   55 minutes with more than 50% spent in reviewing  records, counseling patient/family and coordinating care.   Sch Meds:  Scheduled Meds:  insulin  aspart  0-15 Units Subcutaneous TID WC   insulin  aspart  0-5 Units Subcutaneous QHS   insulin  aspart  5 Units Subcutaneous TID WC   insulin  glargine  20 Units Subcutaneous Daily   lisinopril   10 mg Oral Daily   Continuous Infusions:  cefTRIAXone  (ROCEPHIN )  IV     vancomycin  1,250 mg (10/15/23 0832)   PRN Meds:.HYDROmorphone  (DILAUDID ) injection, labetalol , oxyCODONE , phenol, polyethylene glycol, senna-docusate  Antimicrobials: Anti-infectives (From admission, onward)    Start     Dose/Rate Route Frequency Ordered Stop   10/15/23 1200  cefTRIAXone  (ROCEPHIN ) 2 g in sodium chloride  0.9 % 100 mL IVPB        2 g 200 mL/hr over 30 Minutes Intravenous Every 24 hours 10/14/23 1849     10/15/23 0900  vancomycin  (VANCOREADY) IVPB 1250 mg/250 mL        1,250 mg 166.7 mL/hr over 90 Minutes Intravenous Every 12 hours 10/14/23 1950     10/14/23 2045  vancomycin  (VANCOREADY) IVPB 1500 mg/300 mL        1,500 mg 150 mL/hr over 120 Minutes Intravenous  Once 10/14/23 1947 10/15/23 0047   10/14/23 1900  cefTRIAXone  (ROCEPHIN ) 2 g in sodium chloride  0.9 % 100 mL IVPB  Status:  Discontinued        2 g 200 mL/hr over 30 Minutes Intravenous Every 24 hours 10/14/23 1849 10/14/23 1849   10/14/23 1600  clindamycin  (CLEOCIN ) IVPB 600 mg        600 mg 100 mL/hr over 30 Minutes Intravenous  Once 10/14/23 1551 10/14/23 1720   10/14/23 1215  cefTRIAXone  (ROCEPHIN ) 2 g in sodium chloride  0.9 % 100 mL IVPB        2 g 200 mL/hr over 30 Minutes Intravenous Once 10/14/23 1200 10/14/23 1303   10/14/23 1215  vancomycin  (VANCOCIN ) IVPB 1000 mg/200 mL premix        1,000 mg 200 mL/hr over 60 Minutes Intravenous  Once 10/14/23 1200 10/14/23 1329        I have personally reviewed the following labs and images: CBC: Recent Labs  Lab 10/14/23 1124 10/15/23 0355  WBC 18.9* 17.5*  NEUTROABS 14.8*  --   HGB 14.5  11.9*  HCT 43.7 36.0  MCV 90.3 90.9  PLT 357 324   BMP &GFR Recent Labs  Lab 10/14/23 1124 10/15/23 0355  NA 136 132*  K 3.6 3.9  CL 101 99  CO2 23 23  GLUCOSE 285* 279*  BUN 10 6  CREATININE 0.58 0.63  CALCIUM 9.3 8.4*   Estimated Creatinine Clearance: 132 mL/min (by C-G formula based on SCr of 0.63 mg/dL). Liver & Pancreas: Recent Labs  Lab 10/14/23 1124  AST 11*  ALT 12  ALKPHOS 98  BILITOT 0.9  PROT 7.1  ALBUMIN 3.0*   No results for input(s): LIPASE, AMYLASE in the last 168 hours. No results for input(s): AMMONIA in the last 168 hours. Diabetic: Recent Labs    10/14/23 2049  HGBA1C 7.7*   Recent Labs  Lab 10/14/23 1641 10/14/23  1757 10/14/23 2009 10/15/23 0643  GLUCAP 213* 184* 214* 225*   Cardiac Enzymes: No results for input(s): CKTOTAL, CKMB, CKMBINDEX, TROPONINI in the last 168 hours. No results for input(s): PROBNP in the last 8760 hours. Coagulation Profile: Recent Labs  Lab 10/14/23 1215  INR 1.2   Thyroid Function Tests: No results for input(s): TSH, T4TOTAL, FREET4, T3FREE, THYROIDAB in the last 72 hours. Lipid Profile: No results for input(s): CHOL, HDL, LDLCALC, TRIG, CHOLHDL, LDLDIRECT in the last 72 hours. Anemia Panel: No results for input(s): VITAMINB12, FOLATE, FERRITIN, TIBC, IRON, RETICCTPCT in the last 72 hours. Urine analysis:    Component Value Date/Time   COLORURINE YELLOW 04/03/2020 1710   APPEARANCEUR CLEAR 04/03/2020 1710   LABSPEC 1.022 04/03/2020 1710   PHURINE 6.0 04/03/2020 1710   GLUCOSEU >=500 (A) 04/03/2020 1710   HGBUR NEGATIVE 04/03/2020 1710   BILIRUBINUR NEGATIVE 04/03/2020 1710   KETONESUR NEGATIVE 04/03/2020 1710   PROTEINUR NEGATIVE 04/03/2020 1710   UROBILINOGEN 1.0 11/19/2008 2308   NITRITE NEGATIVE 04/03/2020 1710   LEUKOCYTESUR NEGATIVE 04/03/2020 1710   Sepsis Labs: Invalid input(s): PROCALCITONIN, LACTICIDVEN  Microbiology: Recent  Results (from the past 240 hours)  Blood Culture (routine x 2)     Status: None (Preliminary result)   Collection Time: 10/14/23 12:15 PM   Specimen: BLOOD  Result Value Ref Range Status   Specimen Description BLOOD LEFT ANTECUBITAL  Final   Special Requests   Final    BOTTLES DRAWN AEROBIC AND ANAEROBIC Blood Culture results may not be optimal due to an inadequate volume of blood received in culture bottles   Culture   Final    NO GROWTH < 24 HOURS Performed at [facility], [address] [place], [place] [address]    Report Status PENDING  Incomplete  Blood Culture (routine x 2)     Status: None (Preliminary result)   Collection Time: 10/14/23 12:19 PM   Specimen: BLOOD RIGHT FOREARM  Result Value Ref Range Status   Specimen Description BLOOD RIGHT FOREARM  Final   Special Requests   Final    BOTTLES DRAWN AEROBIC ONLY Blood Culture results may not be optimal due to an inadequate volume of blood received in culture bottles   Culture   Final    NO GROWTH < 24 HOURS Performed at [facility], [address], [place], [place] [address]    Report Status PENDING  Incomplete  Resp panel by RT-PCR (RSV, Flu A&B, Covid) Anterior Nasal Swab     Status: None   Collection Time: 10/14/23 12:21 PM   Specimen: Anterior Nasal Swab  Result Value Ref Range Status   SARS Coronavirus 2 by RT PCR NEGATIVE NEGATIVE Final   Influenza A by PCR NEGATIVE NEGATIVE Final   Influenza B by PCR NEGATIVE NEGATIVE Final    Comment: (NOTE) The Xpert Xpress SARS-CoV-2/FLU/RSV plus assay is intended as an aid in the diagnosis of influenza from Nasopharyngeal swab specimens and should not be used as a sole basis for treatment. Nasal washings and aspirates are unacceptable for Xpert Xpress SARS-CoV-2/FLU/RSV testing.  Fact Sheet for Patients: BloggerCourse.com  Fact Sheet for Healthcare Providers: SeriousBroker.it  This test is not yet approved  or cleared by the [place]  FDA and has been authorized for detection and/or diagnosis of SARS-CoV-2 by FDA under an Emergency Use Authorization (EUA). This EUA will remain in effect (meaning this test can be used) for the duration of the COVID-19 declaration under Section 564(b)(1) of  the Act, 21 U.S.C. section 360bbb-3(b)(1), unless the authorization is terminated or revoked.     Resp Syncytial Virus by PCR NEGATIVE NEGATIVE Final    Comment: (NOTE) Fact Sheet for Patients: BloggerCourse.com  Fact Sheet for Healthcare Providers: SeriousBroker.it  This test is not yet approved or cleared by the [place]  FDA and has been authorized for detection and/or diagnosis of SARS-CoV-2 by FDA under an Emergency Use Authorization (EUA). This EUA will remain in effect (meaning this test can be used) for the duration of the COVID-19 declaration under Section 564(b)(1) of the Act, 21 U.S.C. section 360bbb-3(b)(1), unless the authorization is terminated or revoked.  Performed at [facility], [address] [place], [place] [address]   Aerobic/Anaerobic Culture w Gram Stain (surgical/deep wound)     Status: None (Preliminary result)   Collection Time: 10/14/23  5:30 PM   Specimen: Wound  Result Value Ref Range Status   Specimen Description ABSCESS BACK  Final   Special Requests NONE  Final   Gram Stain PENDING  Incomplete   Culture   Final    TOO YOUNG TO READ Performed at [facility], [address] [place], [place] [address]    Report Status PENDING  Incomplete    Radiology Studies: CT ABDOMEN PELVIS W CONTRAST Result Date: 10/14/2023 CLINICAL DATA:  Back pain, cellulitis EXAM: CT ABDOMEN AND PELVIS WITH CONTRAST TECHNIQUE: Multidetector CT imaging of the abdomen and pelvis was performed using the standard protocol following bolus administration of intravenous contrast. RADIATION DOSE REDUCTION: This exam was  performed according to the departmental dose-optimization program which includes automated exposure control, adjustment of the mA and/or kV according to patient size and/or use of iterative reconstruction technique. CONTRAST:  75mL OMNIPAQUE  IOHEXOL  350 MG/ML SOLN COMPARISON:  04/03/2020 FINDINGS: Lower chest: No acute pleural or parenchymal lung disease. Hepatobiliary: No focal liver abnormality is seen. Status post cholecystectomy. No biliary dilatation. Pancreas: Unremarkable. No pancreatic ductal dilatation or surrounding inflammatory changes. Spleen: Normal in size without focal abnormality. Adrenals/Urinary Tract: Adrenal glands are unremarkable. Kidneys are normal, without renal calculi, focal lesion, or hydronephrosis. Bladder is unremarkable. Stomach/Bowel: No bowel obstruction or ileus. Normal appendix right lower quadrant. No bowel wall thickening or inflammatory change. Large infraumbilical ventral hernia containing mid transverse colon and distal small bowel. No incarceration or obstruction. Vascular/Lymphatic: No significant vascular findings are present. No enlarged abdominal or pelvic lymph nodes. Reproductive: Uterus and bilateral adnexa are unremarkable. Other: No free fluid or free intraperitoneal gas. Large infraumbilical midline ventral hernia containing portions of the mid transverse colon and distal small bowel. No incarceration, obstruction, or ischemia. Musculoskeletal: There is extensive subcutaneous fat stranding within the soft tissues of the lower back, greatest to the left of midline at the L2 level. Within the subcutaneous fat stranding in this region there are multiple punctate foci of gas, concerning for infection with gas forming organism. No fluid collection or drainable abscess. No acute or destructive bony abnormalities. Reconstructed images demonstrate no additional findings. IMPRESSION: 1. Extensive cellulitis within the lower back, with punctate foci of gas within the region  of inflammation in the left lower back consistent with infection by gas-forming organism or necrotizing fasciitis. 2. Large infraumbilical midline ventral hernia containing portions of the transverse colon and small bowel. No incarceration, obstruction, or ischemia. Electronically Signed   By: Ozell Daring M.D.   On: 10/14/2023 15:25      Darvis Croft T. Maelee Hoot [facility]  If 7PM-7AM, please contact night-coverage [email or web address]  10/15/2023, 10:52 AM

## 2023-10-15 NOTE — Progress Notes (Signed)
 Progress Note  1 Day Post-Op  Subjective: Patient still having pain around incision site. Has been tolerating diet and mobilizing.  ROS  All negative with the exception of above.  Objective: Vital signs in last 24 hours: Temp:  [97.8 F (36.6 C)-98.6 F (37 C)] 98.4 F (36.9 C) (09/09 0914) Pulse Rate:  [96-123] 96 (09/09 0914) Resp:  [11-20] 16 (09/09 0914) BP: (114-172)/(76-121) 153/96 (09/09 0914) SpO2:  [92 %-100 %] 95 % (09/09 0914) Weight:  [145.2 kg-150.9 kg] 150.9 kg (09/08 1945) Last BM Date : 10/14/23  Intake/Output from previous day: 09/08 0701 - 09/09 0700 In: 1297.6 [IV Piggyback:1297.6] Out: -  Intake/Output this shift: Total I/O In: 240 [P.O.:240] Out: -   PE: General: Pleasant female who is laying in bed in NAD. Lungs: Respiratory effort nonlabored MS: all 4 extremities are symmetrical. Skin: Warm and dry. Wound incision of left back with packing. Some erythema noted surrounding incision. Minimal induration. No flatulence.  No purulent discharge expressed. Some minimal bleeding during dressing change.  Psych: A&Ox3 with an appropriate affect.    Lab Results:  Recent Labs    10/14/23 1124 10/15/23 0355  WBC 18.9* 17.5*  HGB 14.5 11.9*  HCT 43.7 36.0  PLT 357 324   BMET Recent Labs    10/14/23 1124 10/15/23 0355  NA 136 132*  K 3.6 3.9  CL 101 99  CO2 23 23  GLUCOSE 285* 279*  BUN 10 6  CREATININE 0.58 0.63  CALCIUM 9.3 8.4*   PT/INR Recent Labs    10/14/23 1215  LABPROT 15.5*  INR 1.2   CMP     Component Value Date/Time   NA 132 (L) 10/15/2023 0355   K 3.9 10/15/2023 0355   CL 99 10/15/2023 0355   CO2 23 10/15/2023 0355   GLUCOSE 279 (H) 10/15/2023 0355   BUN 6 10/15/2023 0355   CREATININE 0.63 10/15/2023 0355   CALCIUM 8.4 (L) 10/15/2023 0355   PROT 7.1 10/14/2023 1124   ALBUMIN 3.0 (L) 10/14/2023 1124   AST 11 (L) 10/14/2023 1124   ALT 12 10/14/2023 1124   ALKPHOS 98 10/14/2023 1124   BILITOT 0.9 10/14/2023  1124   GFRNONAA >60 10/15/2023 0355   GFRAA >60 02/08/2016 1225   Lipase     Component Value Date/Time   LIPASE 27 04/03/2020 1645       Studies/Results: CT ABDOMEN PELVIS W CONTRAST Result Date: 10/14/2023 CLINICAL DATA:  Back pain, cellulitis EXAM: CT ABDOMEN AND PELVIS WITH CONTRAST TECHNIQUE: Multidetector CT imaging of the abdomen and pelvis was performed using the standard protocol following bolus administration of intravenous contrast. RADIATION DOSE REDUCTION: This exam was performed according to the departmental dose-optimization program which includes automated exposure control, adjustment of the mA and/or kV according to patient size and/or use of iterative reconstruction technique. CONTRAST:  75mL OMNIPAQUE  IOHEXOL  350 MG/ML SOLN COMPARISON:  04/03/2020 FINDINGS: Lower chest: No acute pleural or parenchymal lung disease. Hepatobiliary: No focal liver abnormality is seen. Status post cholecystectomy. No biliary dilatation. Pancreas: Unremarkable. No pancreatic ductal dilatation or surrounding inflammatory changes. Spleen: Normal in size without focal abnormality. Adrenals/Urinary Tract: Adrenal glands are unremarkable. Kidneys are normal, without renal calculi, focal lesion, or hydronephrosis. Bladder is unremarkable. Stomach/Bowel: No bowel obstruction or ileus. Normal appendix right lower quadrant. No bowel wall thickening or inflammatory change. Large infraumbilical ventral hernia containing mid transverse colon and distal small bowel. No incarceration or obstruction. Vascular/Lymphatic: No significant vascular findings are present. No enlarged abdominal  or pelvic lymph nodes. Reproductive: Uterus and bilateral adnexa are unremarkable. Other: No free fluid or free intraperitoneal gas. Large infraumbilical midline ventral hernia containing portions of the mid transverse colon and distal small bowel. No incarceration, obstruction, or ischemia. Musculoskeletal: There is extensive  subcutaneous fat stranding within the soft tissues of the lower back, greatest to the left of midline at the L2 level. Within the subcutaneous fat stranding in this region there are multiple punctate foci of gas, concerning for infection with gas forming organism. No fluid collection or drainable abscess. No acute or destructive bony abnormalities. Reconstructed images demonstrate no additional findings. IMPRESSION: 1. Extensive cellulitis within the lower back, with punctate foci of gas within the region of inflammation in the left lower back consistent with infection by gas-forming organism or necrotizing fasciitis. 2. Large infraumbilical midline ventral hernia containing portions of the transverse colon and small bowel. No incarceration, obstruction, or ischemia. Electronically Signed   By: Ozell Daring M.D.   On: 10/14/2023 15:25    Anti-infectives: Anti-infectives (From admission, onward)    Start     Dose/Rate Route Frequency Ordered Stop   10/15/23 1200  cefTRIAXone  (ROCEPHIN ) 2 g in sodium chloride  0.9 % 100 mL IVPB        2 g 200 mL/hr over 30 Minutes Intravenous Every 24 hours 10/14/23 1849     10/15/23 0900  vancomycin  (VANCOREADY) IVPB 1250 mg/250 mL        1,250 mg 166.7 mL/hr over 90 Minutes Intravenous Every 12 hours 10/14/23 1950     10/14/23 2045  vancomycin  (VANCOREADY) IVPB 1500 mg/300 mL        1,500 mg 150 mL/hr over 120 Minutes Intravenous  Once 10/14/23 1947 10/15/23 0047   10/14/23 1900  cefTRIAXone  (ROCEPHIN ) 2 g in sodium chloride  0.9 % 100 mL IVPB  Status:  Discontinued        2 g 200 mL/hr over 30 Minutes Intravenous Every 24 hours 10/14/23 1849 10/14/23 1849   10/14/23 1600  clindamycin  (CLEOCIN ) IVPB 600 mg        600 mg 100 mL/hr over 30 Minutes Intravenous  Once 10/14/23 1551 10/14/23 1720   10/14/23 1215  cefTRIAXone  (ROCEPHIN ) 2 g in sodium chloride  0.9 % 100 mL IVPB        2 g 200 mL/hr over 30 Minutes Intravenous Once 10/14/23 1200 10/14/23 1303    10/14/23 1215  vancomycin  (VANCOCIN ) IVPB 1000 mg/200 mL premix        1,000 mg 200 mL/hr over 60 Minutes Intravenous  Once 10/14/23 1200 10/14/23 1329        Assessment/Plan POD1: S/P incision and drainage of left back abscess by Dr. Vicenta Poli on 10/14/2023 -Afebrile. Some hypertension during encounter. Hemodynamically stable. -WBC 17.5 from 18.9 -HGB 11.9 from 14.5 -Culture w Gram stain from 9/8 pending -Continue IV antibiotics -Multi-modal pain management -Dressing exchanged during encounter; Recommend wet to dry dressing changes BID. Okay to shower. Can arrange dressing changes around showers.  -Will continue to monitor  FEN: Carb modified; IVF per primary team VTE: SCDs ID: Ceftriaxone /Clindamycin  (complete)/Vancomycin     LOS: 1 day   I reviewed specialist notes, nursing notes, last 24 h vitals and pain scores, last 48 h intake and output, last 24 h labs and trends, and last 24 h imaging results.   Marjorie Carlyon Favre, [facility] Surgery 10/15/2023, 10:38 AM Please see Amion for pager number during day hours 7:00am-4:30pm

## 2023-10-15 NOTE — Anesthesia Postprocedure Evaluation (Signed)
 Anesthesia Post Note  Patient: Darlene Rodriguez  Procedure(s) Performed: IRRIGATION AND DEBRIDEMENT WOUND     Patient location during evaluation: PACU Anesthesia Type: General Level of consciousness: awake and alert Pain management: pain level controlled Vital Signs Assessment: post-procedure vital signs reviewed and stable Respiratory status: spontaneous breathing, nonlabored ventilation, respiratory function stable and patient connected to nasal cannula oxygen Cardiovascular status: blood pressure returned to baseline and stable Postop Assessment: no apparent nausea or vomiting Anesthetic complications: no   No notable events documented.  Last Vitals:  Vitals:   10/15/23 0016 10/15/23 0420  BP: 133/83 137/77  Pulse: (!) 102 100  Resp: 18 18  Temp: 36.9 C 36.6 C  SpO2: 98% 98%    Last Pain:  Vitals:   10/15/23 0508  TempSrc:   PainSc: 6                  Debby FORBES Like

## 2023-10-16 LAB — RENAL FUNCTION PANEL
Albumin: 2.9 g/dL — ABNORMAL LOW (ref 3.5–5.0)
Anion gap: 14 (ref 5–15)
BUN: 9 mg/dL (ref 6–20)
CO2: 25 mmol/L (ref 22–32)
Calcium: 9 mg/dL (ref 8.9–10.3)
Chloride: 99 mmol/L (ref 98–111)
Creatinine, Ser: 0.63 mg/dL (ref 0.44–1.00)
GFR, Estimated: >60 mL/min (ref >60)
Glucose, Bld: 178 mg/dL — ABNORMAL HIGH (ref 70–99)
Phosphorus: 4.9 mg/dL — ABNORMAL HIGH (ref 2.5–4.6)
Potassium: 3.5 mmol/L (ref 3.5–5.1)
Sodium: 138 mmol/L (ref 135–145)

## 2023-10-16 LAB — CBC
HCT: 38.3 % (ref 36.0–46.0)
Hemoglobin: 12.7 g/dL (ref 12.0–15.0)
MCH: 30.3 pg (ref 26.0–34.0)
MCHC: 33.2 g/dL (ref 30.0–36.0)
MCV: 91.4 fL (ref 80.0–100.0)
Platelets: 326 K/uL (ref 150–400)
RBC: 4.19 MIL/uL (ref 3.87–5.11)
RDW: 12.9 % (ref 11.5–15.5)
WBC: 11.3 K/uL — ABNORMAL HIGH (ref 4.0–10.5)
nRBC: 0 % (ref 0.0–0.2)

## 2023-10-16 LAB — GLUCOSE, CAPILLARY
Glucose-Capillary: 162 mg/dL — ABNORMAL HIGH (ref 70–99)
Glucose-Capillary: 217 mg/dL — ABNORMAL HIGH (ref 70–99)
Glucose-Capillary: 161 mg/dL — ABNORMAL HIGH (ref 70–99)
Glucose-Capillary: 180 mg/dL — ABNORMAL HIGH (ref 70–99)

## 2023-10-16 LAB — MAGNESIUM: Magnesium: 1.8 mg/dL (ref 1.7–2.4)

## 2023-10-16 LAB — TROPONIN I (HIGH SENSITIVITY): Troponin I (High Sensitivity): 4 ng/L (ref <18)

## 2023-10-16 MED ORDER — METHOCARBAMOL 1000 MG/10ML IJ SOLN: 500.0000 mg | Freq: Four times a day (QID) | INTRAMUSCULAR | Status: DC | PRN

## 2023-10-16 NOTE — Significant Event (Signed)
 Rapid Response Event Note   Reason for Call :  Chest tightness  Initial Focused Assessment:  Patient is lying on her side in the bed.  She is alert and oriented.  She is a little tearful.  She describes pain across her chest 7/10. She states that the pain on her back is about a 3/10 and she has a mild headache and is dizzy.  She is warm and dry. Lung sounds clear  BP 184/94  HR 108  RR 20  O2 sat 100% on RA.  Interventions:  12 lead EKG done Troponin ordered Labetalol  given for high BP,  no improvement in symptoms. 1mg  Dilaudid  given IV for pain 7/10  improved to 1/10.      Plan of Care:     Event Summary:   MD Notified: Dr Leotis Client Time: 1350 End Time: 1445  Elvin [place], RN

## 2023-10-16 NOTE — Progress Notes (Signed)
 Patient complaining of chest pain, feeling tight, and being dizzy Dr. Leotis notified. Orders placed, Vitals signs obtained. Pt is lying in bed on her right side. Rapid called and EKG tech called. 1350 rapid RN arrived to room.

## 2023-10-16 NOTE — Progress Notes (Signed)
 Progress Note  2 Days Post-Op  Subjective: Having pain around incision site. Tolerating diet and mobilizing. Patient states that wound dressing was recently changed.  ROS  All negative with the exception of above.  Objective: Vital signs in last 24 hours: Temp:  [97.3 F (36.3 C)-97.9 F (36.6 C)] 97.9 F (36.6 C) (09/10 0822) Pulse Rate:  [101-109] 103 (09/10 0822) Resp:  [16-18] 16 (09/10 0822) BP: (123-174)/(71-89) 174/89 (09/10 0822) SpO2:  [97 %-100 %] 100 % (09/10 0822) Last BM Date : 10/14/23  Intake/Output from previous day: 09/09 0701 - 09/10 0700 In: 1080 [P.O.:480; IV Piggyback:600] Out: -  Intake/Output this shift: No intake/output data recorded.  PE: General: Pleasant female who is laying in bed in NAD. Lungs: Respiratory effort nonlabored MS: all 4 extremities are symmetrical. Skin: Warm and dry. Wound incision of left back with packing. Some erythema noted surrounding incision. Minimal induration. No flatulence. No purulent discharge expressed. Some dried blood around incision. Psych: A&Ox3 with an appropriate affect.    Lab Results:  Recent Labs    10/15/23 0355 10/16/23 0523  WBC 17.5* 11.3*  HGB 11.9* 12.7  HCT 36.0 38.3  PLT 324 326   BMET Recent Labs    10/15/23 0355 10/16/23 0523  NA 132* 138  K 3.9 3.5  CL 99 99  CO2 23 25  GLUCOSE 279* 178*  BUN 6 9  CREATININE 0.63 0.63  CALCIUM 8.4* 9.0   PT/INR Recent Labs    10/14/23 1215  LABPROT 15.5*  INR 1.2   CMP     Component Value Date/Time   NA 138 10/16/2023 0523   K 3.5 10/16/2023 0523   CL 99 10/16/2023 0523   CO2 25 10/16/2023 0523   GLUCOSE 178 (H) 10/16/2023 0523   BUN 9 10/16/2023 0523   CREATININE 0.63 10/16/2023 0523   CALCIUM 9.0 10/16/2023 0523   PROT 7.1 10/14/2023 1124   ALBUMIN 2.9 (L) 10/16/2023 0523   AST 11 (L) 10/14/2023 1124   ALT 12 10/14/2023 1124   ALKPHOS 98 10/14/2023 1124   BILITOT 0.9 10/14/2023 1124   GFRNONAA >60 10/16/2023 0523    GFRAA >60 02/08/2016 1225   Lipase     Component Value Date/Time   LIPASE 27 04/03/2020 1645       Studies/Results: CT ABDOMEN PELVIS W CONTRAST Result Date: 10/14/2023 CLINICAL DATA:  Back pain, cellulitis EXAM: CT ABDOMEN AND PELVIS WITH CONTRAST TECHNIQUE: Multidetector CT imaging of the abdomen and pelvis was performed using the standard protocol following bolus administration of intravenous contrast. RADIATION DOSE REDUCTION: This exam was performed according to the departmental dose-optimization program which includes automated exposure control, adjustment of the mA and/or kV according to patient size and/or use of iterative reconstruction technique. CONTRAST:  75mL OMNIPAQUE  IOHEXOL  350 MG/ML SOLN COMPARISON:  04/03/2020 FINDINGS: Lower chest: No acute pleural or parenchymal lung disease. Hepatobiliary: No focal liver abnormality is seen. Status post cholecystectomy. No biliary dilatation. Pancreas: Unremarkable. No pancreatic ductal dilatation or surrounding inflammatory changes. Spleen: Normal in size without focal abnormality. Adrenals/Urinary Tract: Adrenal glands are unremarkable. Kidneys are normal, without renal calculi, focal lesion, or hydronephrosis. Bladder is unremarkable. Stomach/Bowel: No bowel obstruction or ileus. Normal appendix right lower quadrant. No bowel wall thickening or inflammatory change. Large infraumbilical ventral hernia containing mid transverse colon and distal small bowel. No incarceration or obstruction. Vascular/Lymphatic: No significant vascular findings are present. No enlarged abdominal or pelvic lymph nodes. Reproductive: Uterus and bilateral adnexa are unremarkable. Other: No  free fluid or free intraperitoneal gas. Large infraumbilical midline ventral hernia containing portions of the mid transverse colon and distal small bowel. No incarceration, obstruction, or ischemia. Musculoskeletal: There is extensive subcutaneous fat stranding within the soft tissues  of the lower back, greatest to the left of midline at the L2 level. Within the subcutaneous fat stranding in this region there are multiple punctate foci of gas, concerning for infection with gas forming organism. No fluid collection or drainable abscess. No acute or destructive bony abnormalities. Reconstructed images demonstrate no additional findings. IMPRESSION: 1. Extensive cellulitis within the lower back, with punctate foci of gas within the region of inflammation in the left lower back consistent with infection by gas-forming organism or necrotizing fasciitis. 2. Large infraumbilical midline ventral hernia containing portions of the transverse colon and small bowel. No incarceration, obstruction, or ischemia. Electronically Signed   By: Ozell Daring M.D.   On: 10/14/2023 15:25    Anti-infectives: Anti-infectives (From admission, onward)    Start     Dose/Rate Route Frequency Ordered Stop   10/15/23 1200  cefTRIAXone  (ROCEPHIN ) 2 g in sodium chloride  0.9 % 100 mL IVPB        2 g 200 mL/hr over 30 Minutes Intravenous Every 24 hours 10/14/23 1849     10/15/23 0900  vancomycin  (VANCOREADY) IVPB 1250 mg/250 mL        1,250 mg 166.7 mL/hr over 90 Minutes Intravenous Every 12 hours 10/14/23 1950     10/14/23 2045  vancomycin  (VANCOREADY) IVPB 1500 mg/300 mL        1,500 mg 150 mL/hr over 120 Minutes Intravenous  Once 10/14/23 1947 10/15/23 0047   10/14/23 1900  cefTRIAXone  (ROCEPHIN ) 2 g in sodium chloride  0.9 % 100 mL IVPB  Status:  Discontinued        2 g 200 mL/hr over 30 Minutes Intravenous Every 24 hours 10/14/23 1849 10/14/23 1849   10/14/23 1600  clindamycin  (CLEOCIN ) IVPB 600 mg        600 mg 100 mL/hr over 30 Minutes Intravenous  Once 10/14/23 1551 10/14/23 1720   10/14/23 1215  cefTRIAXone  (ROCEPHIN ) 2 g in sodium chloride  0.9 % 100 mL IVPB        2 g 200 mL/hr over 30 Minutes Intravenous Once 10/14/23 1200 10/14/23 1303   10/14/23 1215  vancomycin  (VANCOCIN ) IVPB 1000 mg/200 mL  premix        1,000 mg 200 mL/hr over 60 Minutes Intravenous  Once 10/14/23 1200 10/14/23 1329        Assessment/Plan POD2: S/P incision and drainage of left back abscess by Dr. Vicenta Poli on 10/14/2023 -Afebrile. Some hypertension and mild tachycardia. Hemodynamically stable. -WBC 11.3 from 17.5 -HGB 12.7 from 11.9 -Culture w Gram stain from 9/8 pending -Continue IV antibiotics -Multi-modal pain management; Will provide robaxin  to try and help with pain. -Continue wet to dry dressing changes BID. Okay to shower. Can arrange dressing changes around showers.  -Will continue to monitor   FEN: Carb modified; IVF per primary team VTE: SCDs ID: Ceftriaxone /Clindamycin  (complete)/Vancomycin    LOS: 2 days   I reviewed specialist notes, hospitalist notes, last 24 h vitals and pain scores, last 48 h intake and output, last 24 h labs and trends, and last 24 h imaging results.   Marjorie Carlyon Favre, [facility] 10/16/2023, 9:48 AM Please see Amion for pager number during day hours 7:00am-4:30pm

## 2023-10-16 NOTE — Progress Notes (Signed)
 PROGRESS NOTE    Darlene Rodriguez  FMW:[id number] DOB: 02-19-1980 DOA: 10/14/2023 PCP: Patient, No Pcp Per   Brief Narrative: This 43 yrs old female with PMH significant of HTN, Morbid obesity and DM-2 not on meds sent to ED from urgent care due to left middle back abscess /cellulitis that did not improve with outpatient antibiotics including a week of Augmentin  and 4 days of doxycycline .  She was admitted with sepsis due to left buttock cellulitis/abscess.  CT abdomen and pelvis raised concern for cellulitis with possible necrotizing fasciitis.  She was taken to the OR and underwent I&D with no significant output.  Necrotizing fasciitis ruled out.  Started on broad-spectrum antibiotics,  pending abscess culture.   Assessment & Plan:   Principal Problem:   Sepsis due to cellulitis [facility]) Active Problems:   Morbid obesity (HCC)   Essential hypertension   Uncontrolled type 2 diabetes mellitus with hyperglycemia, without long-term current use of insulin  (HCC)   Sepsis due to left back cellulitis with abscess: POA: Failed outpatient antibiotics treatment with 7 days of Augmentin  and 3 days of doxycycline .   CT abdomen and pelvis raised concern for cellulitis and necrotizing fasciitis. Necrotizing fasciitis was ruled out after incision and drainage. Blood cultures NGTD.  Tissue culture pending. S/p I&D by general surgery by Dr. Vernetta.  Necrotizing fasciitis ruled out. Continue vancomycin  and ceftriaxone  pending abscess culture Continue Pain control per surgery. SCD for VTE prophylaxis.  Will start subcu Lovenox when okay from surgical standpoint   Uncontrolled DM-2 with hyperglycemia:  Hb A1c 7.7% (was 8.2% in 2022).  Not on medication at home.   She says she stopped metformin  about a year ago due to low blood glucoses and 60s. Continue Semglee  20 units daily -Add NovoLog  5 units 3 times daily with meals -Continue SSI-moderate.   Essential hypertension: - Continue home lisinopril    - IV labetalol  as needed   Large infraumbilical abdominal hernia:  No incarceration.   Leukocytosis/bandemia:  Likely due to #1.  Improved. - Antibiotics as above. - Continue monitoring   Hyponatremia: Mild - Continue monitoring   Morbid obesity: Body mass index is 57.1 kg/m. -May benefit from GLP-1 agonist and discussion of bariatric surgery   Chest pain, atypical; Patient reports having chest pain, seems atypical. EKG shows sinus tach normal sinus rhythm, troponin sent. Pain improved after Dilaudid .  DVT prophylaxis: SCDs Code Status: Full code Family Communication: No family at bedside. Disposition Plan:    Status is: Inpatient Remains inpatient appropriate because: Severity of illness.   Consultants:  Orthopedics  Procedures: I and D  Antimicrobials: Anti-infectives (From admission, onward)    Start     Dose/Rate Route Frequency Ordered Stop   10/15/23 1200  cefTRIAXone  (ROCEPHIN ) 2 g in sodium chloride  0.9 % 100 mL IVPB        2 g 200 mL/hr over 30 Minutes Intravenous Every 24 hours 10/14/23 1849     10/15/23 0900  vancomycin  (VANCOREADY) IVPB 1250 mg/250 mL        1,250 mg 166.7 mL/hr over 90 Minutes Intravenous Every 12 hours 10/14/23 1950     10/14/23 2045  vancomycin  (VANCOREADY) IVPB 1500 mg/300 mL        1,500 mg 150 mL/hr over 120 Minutes Intravenous  Once 10/14/23 1947 10/15/23 0047   10/14/23 1900  cefTRIAXone  (ROCEPHIN ) 2 g in sodium chloride  0.9 % 100 mL IVPB  Status:  Discontinued        2 g 200 mL/hr over  30 Minutes Intravenous Every 24 hours 10/14/23 1849 10/14/23 1849   10/14/23 1600  clindamycin  (CLEOCIN ) IVPB 600 mg        600 mg 100 mL/hr over 30 Minutes Intravenous  Once 10/14/23 1551 10/14/23 1720   10/14/23 1215  cefTRIAXone  (ROCEPHIN ) 2 g in sodium chloride  0.9 % 100 mL IVPB        2 g 200 mL/hr over 30 Minutes Intravenous Once 10/14/23 1200 10/14/23 1303   10/14/23 1215  vancomycin  (VANCOCIN ) IVPB 1000 mg/200 mL premix         1,000 mg 200 mL/hr over 60 Minutes Intravenous  Once 10/14/23 1200 10/14/23 1329       Subjective: Patient was seen and examined at bedside.  Overnight events noted. Patient reports feeling better,  she has complained about left-sided chest pain which improves after some time after Dilaudid .  Objective: Vitals:   10/15/23 2017 10/16/23 0822 10/16/23 1339 10/16/23 1428  BP: (!) 144/89 (!) 174/89 (!) 184/94 113/65  Pulse: (!) 109 (!) 103 (!) 108 100  Resp: 18 16 20    Temp: (!) 97.3 F (36.3 C) 97.9 F (36.6 C) 97.8 F (36.6 C)   TempSrc: Oral Oral Oral   SpO2: 97% 100% 100% 95%  Weight:      Height:        Intake/Output Summary (Last 24 hours) at 10/16/2023 1521 Last data filed at 10/16/2023 0534 Gross per 24 hour  Intake 600 ml  Output --  Net 600 ml   Filed Weights   10/14/23 1636 10/14/23 1945  Weight: (!) 145.2 kg (!) 150.9 kg    Examination:  General exam: Appears calm and comfortable, not in any acute distress. Respiratory system: Clear to auscultation. Respiratory effort normal.  RR 14 Cardiovascular system: S1 & S2 heard, RRR. No JVD, murmurs, rubs, gallops or clicks.  Gastrointestinal system: Abdomen is non distended, soft and non tender.  Normal bowel sounds heard. Back:  wound incision on back,  mild erythema noted.  Central nervous system: Alert and oriented x 3. No focal neurological deficits. Extremities: No edema, no cyanosis, no clubbing. Skin: No rashes, lesions or ulcers Psychiatry: Judgement and insight appear normal. Mood & affect appropriate.     Data Reviewed: I have personally reviewed following labs and imaging studies  CBC: Recent Labs  Lab 10/14/23 1124 10/15/23 0355 10/16/23 0523  WBC 18.9* 17.5* 11.3*  NEUTROABS 14.8*  --   --   HGB 14.5 11.9* 12.7  HCT 43.7 36.0 38.3  MCV 90.3 90.9 91.4  PLT 357 324 326   Basic Metabolic Panel: Recent Labs  Lab 10/14/23 1124 10/15/23 0355 10/16/23 0523  NA 136 132* 138  K 3.6 3.9 3.5   CL 101 99 99  CO2 23 23 25   GLUCOSE 285* 279* 178*  BUN 10 6 9   CREATININE 0.58 0.63 0.63  CALCIUM 9.3 8.4* 9.0  MG  --   --  1.8  PHOS  --   --  4.9*   GFR: Estimated Creatinine Clearance: 132 mL/min (by C-G formula based on SCr of 0.63 mg/dL). Liver Function Tests: Recent Labs  Lab 10/14/23 1124 10/16/23 0523  AST 11*  --   ALT 12  --   ALKPHOS 98  --   BILITOT 0.9  --   PROT 7.1  --   ALBUMIN 3.0* 2.9*   No results for input(s): LIPASE, AMYLASE in the last 168 hours. No results for input(s): AMMONIA in the last 168 hours. Coagulation  Profile: Recent Labs  Lab 10/14/23 1215  INR 1.2   Cardiac Enzymes: No results for input(s): CKTOTAL, CKMB, CKMBINDEX, TROPONINI in the last 168 hours. BNP (last 3 results) No results for input(s): PROBNP in the last 8760 hours. HbA1C: Recent Labs    10/14/23 2049  HGBA1C 7.7*   CBG: Recent Labs  Lab 10/15/23 1108 10/15/23 1627 10/15/23 2155 10/16/23 0548 10/16/23 1112  GLUCAP 197* 212* 215* 162* 217*   Lipid Profile: No results for input(s): CHOL, HDL, LDLCALC, TRIG, CHOLHDL, LDLDIRECT in the last 72 hours. Thyroid Function Tests: No results for input(s): TSH, T4TOTAL, FREET4, T3FREE, THYROIDAB in the last 72 hours. Anemia Panel: No results for input(s): VITAMINB12, FOLATE, FERRITIN, TIBC, IRON, RETICCTPCT in the last 72 hours. Sepsis Labs: Recent Labs  Lab 10/14/23 1132  LATICACIDVEN 1.9    Recent Results (from the past 240 hours)  Blood Culture (routine x 2)     Status: None (Preliminary result)   Collection Time: 10/14/23 12:15 PM   Specimen: BLOOD  Result Value Ref Range Status   Specimen Description BLOOD LEFT ANTECUBITAL  Final   Special Requests   Final    BOTTLES DRAWN AEROBIC AND ANAEROBIC Blood Culture results may not be optimal due to an inadequate volume of blood received in culture bottles   Culture   Final    NO GROWTH 2 DAYS Performed at [facility], [address] [place], [place] [address]    Report Status PENDING  Incomplete  Blood Culture (routine x 2)     Status: None (Preliminary result)   Collection Time: 10/14/23 12:19 PM   Specimen: BLOOD RIGHT FOREARM  Result Value Ref Range Status   Specimen Description BLOOD RIGHT FOREARM  Final   Special Requests   Final    BOTTLES DRAWN AEROBIC ONLY Blood Culture results may not be optimal due to an inadequate volume of blood received in culture bottles   Culture   Final    NO GROWTH 2 DAYS Performed at [facility], [address] [place], [place] [address]    Report Status PENDING  Incomplete  Resp panel by RT-PCR (RSV, Flu A&B, Covid) Anterior Nasal Swab     Status: None   Collection Time: 10/14/23 12:21 PM   Specimen: Anterior Nasal Swab  Result Value Ref Range Status   SARS Coronavirus 2 by RT PCR NEGATIVE NEGATIVE Final   Influenza A by PCR NEGATIVE NEGATIVE Final   Influenza B by PCR NEGATIVE NEGATIVE Final    Comment: (NOTE) The Xpert Xpress SARS-CoV-2/FLU/RSV plus assay is intended as an aid in the diagnosis of influenza from Nasopharyngeal swab specimens and should not be used as a sole basis for treatment. Nasal washings and aspirates are unacceptable for Xpert Xpress SARS-CoV-2/FLU/RSV testing.  Fact Sheet for Patients: BloggerCourse.com  Fact Sheet for Healthcare Providers: SeriousBroker.it  This test is not yet approved or cleared by the [place]  FDA and has been authorized for detection and/or diagnosis of SARS-CoV-2 by FDA under an Emergency Use Authorization (EUA). This EUA will remain in effect (meaning this test can be used) for the duration of the COVID-19 declaration under Section 564(b)(1) of the Act, 21 U.S.C. section 360bbb-3(b)(1), unless the authorization is terminated or revoked.     Resp Syncytial Virus by PCR NEGATIVE NEGATIVE Final    Comment: (NOTE) Fact Sheet for  Patients: BloggerCourse.com  Fact Sheet for Healthcare Providers: SeriousBroker.it  This test is not yet approved or cleared by  the [place]  FDA and has been authorized for detection and/or diagnosis of SARS-CoV-2 by FDA under an Emergency Use Authorization (EUA). This EUA will remain in effect (meaning this test can be used) for the duration of the COVID-19 declaration under Section 564(b)(1) of the Act, 21 U.S.C. section 360bbb-3(b)(1), unless the authorization is terminated or revoked.  Performed at [facility], [address] [place], [place] [address]   Aerobic/Anaerobic Culture w Gram Stain (surgical/deep wound)     Status: None (Preliminary result)   Collection Time: 10/14/23  5:30 PM   Specimen: Wound  Result Value Ref Range Status   Specimen Description ABSCESS BACK  Final   Special Requests NONE  Final   Gram Stain PENDING  Incomplete   Culture   Final    MODERATE STAPHYLOCOCCUS AUREUS SUSCEPTIBILITIES TO FOLLOW Performed at [facility], [address] [place], [place] [address]    Report Status PENDING  Incomplete    Radiology Studies: No results found.  Scheduled Meds:  insulin  aspart  0-15 Units Subcutaneous TID WC   insulin  aspart  0-5 Units Subcutaneous QHS   insulin  aspart  5 Units Subcutaneous TID WC   insulin  glargine  20 Units Subcutaneous Daily   lisinopril   10 mg Oral Daily   Continuous Infusions:  cefTRIAXone  (ROCEPHIN )  IV 2 g (10/16/23 1230)   vancomycin  1,250 mg (10/16/23 1006)     LOS: 2 days    Time spent: 50 mins    Darcel Dawley, MD [facility]   If 7PM-7AM, please contact night-coverage

## 2023-10-16 NOTE — Plan of Care (Signed)

## 2023-10-17 LAB — CBC
HCT: 35.7 % — ABNORMAL LOW (ref 36.0–46.0)
Hemoglobin: 11.8 g/dL — ABNORMAL LOW (ref 12.0–15.0)
MCH: 30.1 pg (ref 26.0–34.0)
MCHC: 33.1 g/dL (ref 30.0–36.0)
MCV: 91.1 fL (ref 80.0–100.0)
Platelets: 232 K/uL (ref 150–400)
RBC: 3.92 MIL/uL (ref 3.87–5.11)
RDW: 13 % (ref 11.5–15.5)
WBC: 7.5 K/uL (ref 4.0–10.5)
nRBC: 0 % (ref 0.0–0.2)

## 2023-10-17 LAB — GLUCOSE, CAPILLARY
Glucose-Capillary: 152 mg/dL — ABNORMAL HIGH (ref 70–99)
Glucose-Capillary: 195 mg/dL — ABNORMAL HIGH (ref 70–99)
Glucose-Capillary: 168 mg/dL — ABNORMAL HIGH (ref 70–99)
Glucose-Capillary: 172 mg/dL — ABNORMAL HIGH (ref 70–99)

## 2023-10-17 NOTE — Plan of Care (Signed)
  Problem: Education: Goal: Knowledge of General Education information will improve Description: Including pain rating scale, medication(s)/side effects and non-pharmacologic comfort measures Outcome: Progressing   Problem: Education: Goal: Knowledge of General Education information will improve Description: Including pain rating scale, medication(s)/side effects and non-pharmacologic comfort measures Outcome: Progressing   Problem: Clinical Measurements: Goal: Ability to maintain clinical measurements within normal limits will improve Outcome: Progressing   

## 2023-10-17 NOTE — Plan of Care (Signed)
 Problem: Education: Goal: Knowledge of General Education information will improve Description: Including pain rating scale, medication(s)/side effects and non-pharmacologic comfort measures 10/17/2023 0424 by Billee Marybelle Ragena SHAUNNA, RN Outcome: Progressing 10/17/2023 0405 by Billee Marybelle Ragena SHAUNNA, RN Outcome: Progressing   Problem: Health Behavior/Discharge Planning: Goal: Ability to manage health-related needs will improve 10/17/2023 0424 by Billee Marybelle Ragena SHAUNNA, RN Outcome: Progressing 10/17/2023 0405 by Billee Marybelle Ragena SHAUNNA, RN Outcome: Progressing   Problem: Clinical Measurements: Goal: Ability to maintain clinical measurements within normal limits will improve 10/17/2023 0424 by Billee Marybelle Ragena SHAUNNA, RN Outcome: Progressing 10/17/2023 0405 by Billee Marybelle Ragena SHAUNNA, RN Outcome: Progressing Goal: Will remain free from infection 10/17/2023 0424 by Billee Marybelle Ragena SHAUNNA, RN Outcome: Progressing 10/17/2023 0405 by Billee Marybelle Ragena SHAUNNA, RN Outcome: Progressing Goal: Diagnostic test results will improve 10/17/2023 0424 by Billee Marybelle Ragena SHAUNNA, RN Outcome: Progressing 10/17/2023 0405 by Billee Marybelle Ragena SHAUNNA, RN Outcome: Progressing Goal: Respiratory complications will improve 10/17/2023 0424 by Billee Marybelle Ragena SHAUNNA, RN Outcome: Progressing 10/17/2023 0405 by Billee Marybelle Ragena SHAUNNA, RN Outcome: Progressing Goal: Cardiovascular complication will be avoided 10/17/2023 0424 by Billee Marybelle Ragena SHAUNNA, RN Outcome: Progressing 10/17/2023 0405 by Billee Marybelle Ragena SHAUNNA, RN Outcome: Progressing   Problem: Activity: Goal: Risk for activity intolerance will decrease 10/17/2023 0424 by Billee Marybelle Ragena SHAUNNA, RN Outcome: Progressing 10/17/2023 0405 by Billee Marybelle Ragena SHAUNNA, RN Outcome: Progressing   Problem: Nutrition: Goal: Adequate nutrition will be maintained 10/17/2023 0424 by Billee Marybelle Ragena SHAUNNA, RN Outcome:  Progressing 10/17/2023 0405 by Billee Marybelle Ragena SHAUNNA, RN Outcome: Progressing   Problem: Coping: Goal: Level of anxiety will decrease 10/17/2023 0424 by Billee Marybelle Ragena SHAUNNA, RN Outcome: Progressing 10/17/2023 0405 by Billee Marybelle Ragena SHAUNNA, RN Outcome: Progressing   Problem: Elimination: Goal: Will not experience complications related to bowel motility 10/17/2023 0424 by Billee Marybelle Ragena SHAUNNA, RN Outcome: Progressing 10/17/2023 0405 by Billee Marybelle Ragena SHAUNNA, RN Outcome: Progressing Goal: Will not experience complications related to urinary retention 10/17/2023 0424 by Billee Marybelle Ragena SHAUNNA, RN Outcome: Progressing 10/17/2023 0405 by Billee Marybelle Ragena SHAUNNA, RN Outcome: Progressing   Problem: Pain Managment: Goal: General experience of comfort will improve and/or be controlled 10/17/2023 0424 by Billee Marybelle Ragena SHAUNNA, RN Outcome: Progressing 10/17/2023 0405 by Billee Marybelle Ragena SHAUNNA, RN Outcome: Progressing   Problem: Safety: Goal: Ability to remain free from injury will improve 10/17/2023 0424 by Billee Marybelle Ragena SHAUNNA, RN Outcome: Progressing 10/17/2023 0405 by Billee Marybelle Ragena SHAUNNA, RN Outcome: Progressing   Problem: Skin Integrity: Goal: Risk for impaired skin integrity will decrease 10/17/2023 0424 by Billee Marybelle Ragena SHAUNNA, RN Outcome: Progressing 10/17/2023 0405 by Billee Marybelle Ragena SHAUNNA, RN Outcome: Progressing   Problem: Education: Goal: Ability to describe self-care measures that may prevent or decrease complications (Diabetes Survival Skills Education) will improve 10/17/2023 0424 by Billee Marybelle Ragena SHAUNNA, RN Outcome: Progressing 10/17/2023 0405 by Billee Marybelle Ragena SHAUNNA, RN Outcome: Progressing Goal: Individualized Educational Video(s) 10/17/2023 0424 by Billee Marybelle Ragena SHAUNNA, RN Outcome: Progressing 10/17/2023 0405 by Billee Marybelle Ragena SHAUNNA, RN Outcome: Progressing   Problem: Coping: Goal: Ability to  adjust to condition or change in health will improve 10/17/2023 0424 by Billee Marybelle Ragena SHAUNNA, RN Outcome: Progressing 10/17/2023 0405 by Billee Marybelle Ragena SHAUNNA, RN Outcome: Progressing   Problem: Fluid Volume: Goal: Ability to maintain a balanced intake and output will improve 10/17/2023 0424 by Billee Marybelle Ragena SHAUNNA, RN Outcome: Progressing 10/17/2023 0405  by Billee Marybelle Ragena SHAUNNA, RN Outcome: Progressing   Problem: Health Behavior/Discharge Planning: Goal: Ability to identify and utilize available resources and services will improve 10/17/2023 0424 by Billee Marybelle Ragena SHAUNNA, RN Outcome: Progressing 10/17/2023 0405 by Billee Marybelle Ragena SHAUNNA, RN Outcome: Progressing Goal: Ability to manage health-related needs will improve 10/17/2023 0424 by Billee Marybelle Ragena SHAUNNA, RN Outcome: Progressing 10/17/2023 0405 by Billee Marybelle Ragena SHAUNNA, RN Outcome: Progressing   Problem: Metabolic: Goal: Ability to maintain appropriate glucose levels will improve 10/17/2023 0424 by Billee Marybelle Ragena SHAUNNA, RN Outcome: Progressing 10/17/2023 0405 by Billee Marybelle Ragena SHAUNNA, RN Outcome: Progressing   Problem: Nutritional: Goal: Maintenance of adequate nutrition will improve 10/17/2023 0424 by Billee Marybelle Ragena SHAUNNA, RN Outcome: Progressing 10/17/2023 0405 by Billee Marybelle Ragena SHAUNNA, RN Outcome: Progressing Goal: Progress toward achieving an optimal weight will improve 10/17/2023 0424 by Billee Marybelle Ragena SHAUNNA, RN Outcome: Progressing 10/17/2023 0405 by Billee Marybelle Ragena SHAUNNA, RN Outcome: Progressing   Problem: Skin Integrity: Goal: Risk for impaired skin integrity will decrease 10/17/2023 0424 by Billee Marybelle Ragena SHAUNNA, RN Outcome: Progressing 10/17/2023 0405 by Billee Marybelle Ragena SHAUNNA, RN Outcome: Progressing   Problem: Tissue Perfusion: Goal: Adequacy of tissue perfusion will improve 10/17/2023 0424 by Billee Marybelle Ragena SHAUNNA, RN Outcome:  Progressing 10/17/2023 0405 by Billee Marybelle Ragena SHAUNNA, RN Outcome: Progressing

## 2023-10-17 NOTE — Progress Notes (Signed)
 PROGRESS NOTE    Darlene Rodriguez  FMW:[id number] DOB: [birth year] DOA: 10/14/2023 PCP: Patient, No Pcp Per   Brief Narrative: This 43 yrs old female with PMH significant of HTN, Morbid obesity and DM-2 not on meds sent to ED from urgent care due to left middle back abscess /cellulitis that did not improve with outpatient antibiotics including a week of Augmentin  and 4 days of doxycycline .  She was admitted with sepsis due to left buttock cellulitis/abscess.  CT abdomen and pelvis raised concern for cellulitis with possible necrotizing fasciitis.  She was taken to the OR and underwent I&D with no significant output.  Necrotizing fasciitis ruled out.  Started on broad-spectrum antibiotics,  pending abscess culture.   Assessment & Plan:   Principal Problem:   Sepsis due to cellulitis [facility]) Active Problems:   Morbid obesity (HCC)   Essential hypertension   Uncontrolled type 2 diabetes mellitus with hyperglycemia, without long-term current use of insulin  (HCC)   Sepsis due to left back cellulitis with abscess: POA: Failed outpatient antibiotics treatment with 7 days of Augmentin  and 3 days of doxycycline .   CT abdomen and pelvis raised concern for cellulitis and necrotizing fasciitis. Necrotizing fasciitis was ruled out after incision and drainage. Blood cultures NGTD.  Tissue culture pending. S/p I&D by general surgery by Dr. Vernetta.  Necrotizing fasciitis ruled out. Continue vancomycin  and ceftriaxone  pending abscess culture. Continue Pain control per surgery. SCD for VTE prophylaxis.  Will start subcu Lovenox when okay from surgical standpoint   Uncontrolled DM-2 with hyperglycemia:  Hb A1c 7.7% (was 8.2% in 2022).  Not on medication at home.   She says she stopped metformin  about a year ago due to low blood glucoses and 60s. Continue Semglee  20 units daily -Add NovoLog  5 units 3 times daily with meals -Continue SSI-moderate.   Essential hypertension: - Continue home lisinopril    - IV labetalol  as needed   Large infraumbilical abdominal hernia:  No incarceration.   Leukocytosis/bandemia:  Likely due to #1.  Improved. - Antibiotics as above. - Continue monitoring   Hyponatremia: Mild - Continue monitoring   Morbid obesity: Body mass index is 57.1 kg/m. -May benefit from GLP-1 agonist and discussion of bariatric surgery   Chest pain, atypical; Patient reports having chest pain, seems atypical. EKG shows sinus tach normal sinus rhythm, troponin sent. Pain improved after Dilaudid .  DVT prophylaxis: SCDs Code Status: Full code Family Communication: No family at bedside. Disposition Plan:    Status is: Inpatient Remains inpatient appropriate because: Severity of illness.   Consultants:  Orthopedics  Procedures: I and D  Antimicrobials: Anti-infectives (From admission, onward)    Start     Dose/Rate Route Frequency Ordered Stop   10/15/23 1200  cefTRIAXone  (ROCEPHIN ) 2 g in sodium chloride  0.9 % 100 mL IVPB        2 g 200 mL/hr over 30 Minutes Intravenous Every 24 hours 10/14/23 1849     10/15/23 0900  vancomycin  (VANCOREADY) IVPB 1250 mg/250 mL        1,250 mg 166.7 mL/hr over 90 Minutes Intravenous Every 12 hours 10/14/23 1950     10/14/23 2045  vancomycin  (VANCOREADY) IVPB 1500 mg/300 mL        1,500 mg 150 mL/hr over 120 Minutes Intravenous  Once 10/14/23 1947 10/15/23 0047   10/14/23 1900  cefTRIAXone  (ROCEPHIN ) 2 g in sodium chloride  0.9 % 100 mL IVPB  Status:  Discontinued        2 g 200 mL/hr over  30 Minutes Intravenous Every 24 hours 10/14/23 1849 10/14/23 1849   10/14/23 1600  clindamycin  (CLEOCIN ) IVPB 600 mg        600 mg 100 mL/hr over 30 Minutes Intravenous  Once 10/14/23 1551 10/14/23 1720   10/14/23 1215  cefTRIAXone  (ROCEPHIN ) 2 g in sodium chloride  0.9 % 100 mL IVPB        2 g 200 mL/hr over 30 Minutes Intravenous Once 10/14/23 1200 10/14/23 1303   10/14/23 1215  vancomycin  (VANCOCIN ) IVPB 1000 mg/200 mL premix         1,000 mg 200 mL/hr over 60 Minutes Intravenous  Once 10/14/23 1200 10/14/23 1329       Subjective: Patient was seen and examined at bedside.  Overnight events noted. Patient reports feeling better,  She was sitting comfortably on the chair, denies any pain,  reports feeling much improved  Objective: Vitals:   10/16/23 1537 10/17/23 0010 10/17/23 0827 10/17/23 1531  BP: 123/82 132/82 (!) 150/89 (!) 144/93  Pulse: (!) 107 (!) 107 97 (!) 109  Resp: 16 20 16 16   Temp: 98.3 F (36.8 C) 98.4 F (36.9 C)  98.5 F (36.9 C)  TempSrc: Oral   Oral  SpO2: 99% 100% 98% 97%  Weight:      Height:        Intake/Output Summary (Last 24 hours) at 10/17/2023 1542 Last data filed at 10/16/2023 1654 Gross per 24 hour  Intake 240 ml  Output --  Net 240 ml   Filed Weights   10/14/23 1636 10/14/23 1945  Weight: (!) 145.2 kg (!) 150.9 kg    Examination:  General exam: Appears calm and comfortable, not in any acute distress. Respiratory system: CTA Bilaterally. Respiratory effort normal.  RR 14 Cardiovascular system: S1 & S2 heard, RRR. No JVD, murmurs, rubs, gallops or clicks.  Gastrointestinal system: Abdomen is non distended, soft and non tender.  Normal bowel sounds heard. Back:  wound incision on back,  mild erythema noted.  Central nervous system: Alert and oriented x 3. No focal neurological deficits. Extremities: No edema, no cyanosis, no clubbing. Skin: No rashes, lesions or ulcers Psychiatry: Judgement and insight appear normal. Mood & affect appropriate.   Data Reviewed: I have personally reviewed following labs and imaging studies  CBC: Recent Labs  Lab 10/14/23 1124 10/15/23 0355 10/16/23 0523 10/17/23 0750  WBC 18.9* 17.5* 11.3* 7.5  NEUTROABS 14.8*  --   --   --   HGB 14.5 11.9* 12.7 11.8*  HCT 43.7 36.0 38.3 35.7*  MCV 90.3 90.9 91.4 91.1  PLT 357 324 326 232   Basic Metabolic Panel: Recent Labs  Lab 10/14/23 1124 10/15/23 0355 10/16/23 0523  NA 136 132* 138   K 3.6 3.9 3.5  CL 101 99 99  CO2 23 23 25   GLUCOSE 285* 279* 178*  BUN 10 6 9   CREATININE 0.58 0.63 0.63  CALCIUM 9.3 8.4* 9.0  MG  --   --  1.8  PHOS  --   --  4.9*   GFR: Estimated Creatinine Clearance: 132 mL/min (by C-G formula based on SCr of 0.63 mg/dL). Liver Function Tests: Recent Labs  Lab 10/14/23 1124 10/16/23 0523  AST 11*  --   ALT 12  --   ALKPHOS 98  --   BILITOT 0.9  --   PROT 7.1  --   ALBUMIN 3.0* 2.9*   No results for input(s): LIPASE, AMYLASE in the last 168 hours. No results for input(s): AMMONIA  in the last 168 hours. Coagulation Profile: Recent Labs  Lab 10/14/23 1215  INR 1.2   Cardiac Enzymes: No results for input(s): CKTOTAL, CKMB, CKMBINDEX, TROPONINI in the last 168 hours. BNP (last 3 results) No results for input(s): PROBNP in the last 8760 hours. HbA1C: Recent Labs    10/14/23 2049  HGBA1C 7.7*   CBG: Recent Labs  Lab 10/16/23 1112 10/16/23 1635 10/16/23 2144 10/17/23 0648 10/17/23 1141  GLUCAP 217* 161* 180* 152* 195*   Lipid Profile: No results for input(s): CHOL, HDL, LDLCALC, TRIG, CHOLHDL, LDLDIRECT in the last 72 hours. Thyroid Function Tests: No results for input(s): TSH, T4TOTAL, FREET4, T3FREE, THYROIDAB in the last 72 hours. Anemia Panel: No results for input(s): VITAMINB12, FOLATE, FERRITIN, TIBC, IRON, RETICCTPCT in the last 72 hours. Sepsis Labs: Recent Labs  Lab 10/14/23 1132  LATICACIDVEN 1.9    Recent Results (from the past 240 hours)  Blood Culture (routine x 2)     Status: None (Preliminary result)   Collection Time: 10/14/23 12:15 PM   Specimen: BLOOD  Result Value Ref Range Status   Specimen Description BLOOD LEFT ANTECUBITAL  Final   Special Requests   Final    BOTTLES DRAWN AEROBIC AND ANAEROBIC Blood Culture results may not be optimal due to an inadequate volume of blood received in culture bottles   Culture   Final    NO GROWTH 3  DAYS Performed at [facility], [address] [place], [place] [address]    Report Status PENDING  Incomplete  Blood Culture (routine x 2)     Status: None (Preliminary result)   Collection Time: 10/14/23 12:19 PM   Specimen: BLOOD RIGHT FOREARM  Result Value Ref Range Status   Specimen Description BLOOD RIGHT FOREARM  Final   Special Requests   Final    BOTTLES DRAWN AEROBIC ONLY Blood Culture results may not be optimal due to an inadequate volume of blood received in culture bottles   Culture   Final    NO GROWTH 3 DAYS Performed at [facility], [address] [place], [place] [address]    Report Status PENDING  Incomplete  Resp panel by RT-PCR (RSV, Flu A&B, Covid) Anterior Nasal Swab     Status: None   Collection Time: 10/14/23 12:21 PM   Specimen: Anterior Nasal Swab  Result Value Ref Range Status   SARS Coronavirus 2 by RT PCR NEGATIVE NEGATIVE Final   Influenza A by PCR NEGATIVE NEGATIVE Final   Influenza B by PCR NEGATIVE NEGATIVE Final    Comment: (NOTE) The Xpert Xpress SARS-CoV-2/FLU/RSV plus assay is intended as an aid in the diagnosis of influenza from Nasopharyngeal swab specimens and should not be used as a sole basis for treatment. Nasal washings and aspirates are unacceptable for Xpert Xpress SARS-CoV-2/FLU/RSV testing.  Fact Sheet for Patients: BloggerCourse.com  Fact Sheet for Healthcare Providers: SeriousBroker.it  This test is not yet approved or cleared by the [place]  FDA and has been authorized for detection and/or diagnosis of SARS-CoV-2 by FDA under an Emergency Use Authorization (EUA). This EUA will remain in effect (meaning this test can be used) for the duration of the COVID-19 declaration under Section 564(b)(1) of the Act, 21 U.S.C. section 360bbb-3(b)(1), unless the authorization is terminated or revoked.     Resp Syncytial Virus by PCR NEGATIVE NEGATIVE Final     Comment: (NOTE) Fact Sheet for Patients: BloggerCourse.com  Fact Sheet for Healthcare Providers: SeriousBroker.it  This test is  not yet approved or cleared by the [place]  FDA and has been authorized for detection and/or diagnosis of SARS-CoV-2 by FDA under an Emergency Use Authorization (EUA). This EUA will remain in effect (meaning this test can be used) for the duration of the COVID-19 declaration under Section 564(b)(1) of the Act, 21 U.S.C. section 360bbb-3(b)(1), unless the authorization is terminated or revoked.  Performed at [facility], [address], [place], [place] [address]   Aerobic/Anaerobic Culture w Gram Stain (surgical/deep wound)     Status: None (Preliminary result)   Collection Time: 10/14/23  5:30 PM   Specimen: Wound  Result Value Ref Range Status   Specimen Description ABSCESS BACK  Final   Special Requests NONE  Final   Gram Stain NO WBC SEEN RARE GRAM POSITIVE COCCI   Final   Culture   Final    MODERATE STAPHYLOCOCCUS AUREUS MIC REPEAT (VITEK) Performed at [facility], [address] [place], [place] [address]    Report Status PENDING  Incomplete    Radiology Studies: No results found.  Scheduled Meds:  insulin  aspart  0-15 Units Subcutaneous TID WC   insulin  aspart  0-5 Units Subcutaneous QHS   insulin  aspart  5 Units Subcutaneous TID WC   insulin  glargine  20 Units Subcutaneous Daily   lisinopril   10 mg Oral Daily   Continuous Infusions:  cefTRIAXone  (ROCEPHIN )  IV 2 g (10/17/23 1301)   vancomycin  1,250 mg (10/17/23 0822)     LOS: 3 days    Time spent: 35 mins    Darcel Dawley, MD [facility]   If 7PM-7AM, please contact night-coverage

## 2023-10-17 NOTE — Progress Notes (Signed)
 Progress Note  3 Days Post-Op  Subjective: Still having pain around incision site. Has not tried robaxin  yet. Tolerating diet and mobilizing. Denies BM. Had wound dressing changed prior to encounter.  ROS  All negative with the exception of above.  Objective: Vital signs in last 24 hours: Temp:  [97.8 F (36.6 C)-98.4 F (36.9 C)] 98.4 F (36.9 C) (09/11 0010) Pulse Rate:  [97-108] 97 (09/11 0827) Resp:  [16-20] 16 (09/11 0827) BP: (113-184)/(65-94) 150/89 (09/11 0827) SpO2:  [95 %-100 %] 98 % (09/11 0827) Last BM Date : 10/14/23  Intake/Output from previous day: 09/10 0701 - 09/11 0700 In: 240 [P.O.:240] Out: -  Intake/Output this shift: No intake/output data recorded.  PE: General: Pleasant female who is laying in bed in NAD. Lungs: Respiratory effort nonlabored MS: all 4 extremities are symmetrical. Skin: Warm and dry. Wound incision of left back with packing. Some erythema noted surrounding incision. Minimal induration. No flatulence. No purulent discharge expressed. Some dried blood around incision.  Psych: A&Ox3 with an appropriate affect.    Lab Results:  Recent Labs    10/16/23 0523 10/17/23 0750  WBC 11.3* 7.5  HGB 12.7 11.8*  HCT 38.3 35.7*  PLT 326 232   BMET Recent Labs    10/15/23 0355 10/16/23 0523  NA 132* 138  K 3.9 3.5  CL 99 99  CO2 23 25  GLUCOSE 279* 178*  BUN 6 9  CREATININE 0.63 0.63  CALCIUM 8.4* 9.0   PT/INR Recent Labs    10/14/23 1215  LABPROT 15.5*  INR 1.2   CMP     Component Value Date/Time   NA 138 10/16/2023 0523   K 3.5 10/16/2023 0523   CL 99 10/16/2023 0523   CO2 25 10/16/2023 0523   GLUCOSE 178 (H) 10/16/2023 0523   BUN 9 10/16/2023 0523   CREATININE 0.63 10/16/2023 0523   CALCIUM 9.0 10/16/2023 0523   PROT 7.1 10/14/2023 1124   ALBUMIN 2.9 (L) 10/16/2023 0523   AST 11 (L) 10/14/2023 1124   ALT 12 10/14/2023 1124   ALKPHOS 98 10/14/2023 1124   BILITOT 0.9 10/14/2023 1124   GFRNONAA >60  10/16/2023 0523   GFRAA >60 02/08/2016 1225   Lipase     Component Value Date/Time   LIPASE 27 04/03/2020 1645       Studies/Results: No results found.  Anti-infectives: Anti-infectives (From admission, onward)    Start     Dose/Rate Route Frequency Ordered Stop   10/15/23 1200  cefTRIAXone  (ROCEPHIN ) 2 g in sodium chloride  0.9 % 100 mL IVPB        2 g 200 mL/hr over 30 Minutes Intravenous Every 24 hours 10/14/23 1849     10/15/23 0900  vancomycin  (VANCOREADY) IVPB 1250 mg/250 mL        1,250 mg 166.7 mL/hr over 90 Minutes Intravenous Every 12 hours 10/14/23 1950     10/14/23 2045  vancomycin  (VANCOREADY) IVPB 1500 mg/300 mL        1,500 mg 150 mL/hr over 120 Minutes Intravenous  Once 10/14/23 1947 10/15/23 0047   10/14/23 1900  cefTRIAXone  (ROCEPHIN ) 2 g in sodium chloride  0.9 % 100 mL IVPB  Status:  Discontinued        2 g 200 mL/hr over 30 Minutes Intravenous Every 24 hours 10/14/23 1849 10/14/23 1849   10/14/23 1600  clindamycin  (CLEOCIN ) IVPB 600 mg        600 mg 100 mL/hr over 30 Minutes Intravenous  Once 10/14/23  1551 10/14/23 1720   10/14/23 1215  cefTRIAXone  (ROCEPHIN ) 2 g in sodium chloride  0.9 % 100 mL IVPB        2 g 200 mL/hr over 30 Minutes Intravenous Once 10/14/23 1200 10/14/23 1303   10/14/23 1215  vancomycin  (VANCOCIN ) IVPB 1000 mg/200 mL premix        1,000 mg 200 mL/hr over 60 Minutes Intravenous  Once 10/14/23 1200 10/14/23 1329        Assessment/Plan POD3: S/P incision and drainage of left back abscess by Dr. Vicenta Poli on 10/14/2023 -Afebrile. Mild tachycardia. Hemodynamically stable. -Labs pending for today. -Culture w Gram stain from 9/8 pending: no WBC seen, rare gram positive cocci; Moderate staphylococcus aureus -Continue IV antibiotics -Multi-modal pain management -Continue wet to dry dressing changes BID. Okay to shower. Can arrange dressing changes around showers.  -Will continue to monitor   FEN: Carb modified; IVF per primary  team VTE: SCDs ID: Ceftriaxone /Clindamycin  (complete)/Vancomycin     LOS: 3 days   I reviewed specialist notes, hospitalist notes, last 24 h vitals and pain scores, last 48 h intake and output, last 24 h labs and trends, and last 24 h imaging results.   Marjorie Carlyon Favre, [facility] Surgery 10/17/2023, 8:44 AM Please see Amion for pager number during day hours 7:00am-4:30pm

## 2023-10-17 NOTE — Discharge Instructions (Signed)
WOUND CARE: - dressing to be changed twice daily - supplies: sterile saline, kerlix/guaze, scissors, ABD pads, tape  - remove dressing and all packing carefully, moistening with sterile saline as needed to avoid packing/internal dressing sticking to the wound. - clean edges of skin around the wound with water/gauze, making sure there is no tape debris or leakage left on skin that could cause skin irritation or breakdown. - dampen clean kerlix/gauze with sterile saline and pack wound from wound base to skin level, making sure to take note of any possible areas of wound tracking, tunneling and packing appropriately. Wound can be packed loosely. Trim kerlix/gauze to size if a whole roll/piece is not required. - cover wound with a dry ABD pad and secure with tape.  - write the date/time on the dry dressing/tape to better track when the last dressing change occurred. - apply any skin protectant/powder recommended by clinician to protect skin/skin folds. - change dressing as needed if leakage occurs, wound gets contaminated, or patient requests to shower. - patient may shower daily with wound open and following the shower the wound should be dried and a clean dressing placed.   

## 2023-10-18 LAB — GLUCOSE, CAPILLARY
Glucose-Capillary: 167 mg/dL — ABNORMAL HIGH (ref 70–99)
Glucose-Capillary: 244 mg/dL — ABNORMAL HIGH (ref 70–99)
Glucose-Capillary: 151 mg/dL — ABNORMAL HIGH (ref 70–99)

## 2023-10-18 LAB — VANCOMYCIN, TROUGH: Vancomycin Tr: 9 ug/mL — ABNORMAL LOW (ref 15–20)

## 2023-10-18 MED ORDER — LANCET DEVICE MISC: 1.0000 | Freq: Three times a day (TID) | 0 refills | Status: AC

## 2023-10-18 MED ORDER — LANCETS MISC. MISC: 1.0000 | Freq: Three times a day (TID) | 0 refills | Status: AC

## 2023-10-18 MED ORDER — BLOOD GLUCOSE MONITORING SUPPL DEVI: 1.0000 | Freq: Three times a day (TID) | 0 refills | Status: AC

## 2023-10-18 MED ORDER — INSULIN GLARGINE 100 UNIT/ML SOLOSTAR PEN: 20.0000 [IU] | PEN_INJECTOR | Freq: Every day | SUBCUTANEOUS | 0 refills | Status: DC

## 2023-10-18 MED ORDER — BLOOD GLUCOSE TEST VI STRP: 1.0000 | ORAL_STRIP | Freq: Three times a day (TID) | 0 refills | Status: AC

## 2023-10-18 MED ORDER — SENNOSIDES-DOCUSATE SODIUM 8.6-50 MG PO TABS: 1.0000 | ORAL_TABLET | Freq: Two times a day (BID) | ORAL | 0 refills | Status: AC | PRN

## 2023-10-18 MED ORDER — INSULIN ASPART 100 UNIT/ML FLEXPEN: 0.0000 [IU] | PEN_INJECTOR | Freq: Three times a day (TID) | SUBCUTANEOUS | 11 refills | Status: DC

## 2023-10-18 MED ORDER — OXYCODONE HCL 5 MG PO TABS: 5.0000 mg | ORAL_TABLET | ORAL | 0 refills | Status: AC | PRN

## 2023-10-18 MED ORDER — AMOXICILLIN-POT CLAVULANATE 875-125 MG PO TABS: 1.0000 | ORAL_TABLET | Freq: Two times a day (BID) | ORAL | 0 refills | Status: AC

## 2023-10-18 NOTE — Progress Notes (Signed)
 Patient teaching done on dressing changes with Darlene Rodriguez and her mother who is present at bedside. Mother able to teach back how to do dressing change and she observed while dressing change was done. Supplies given. AVS teaching done with follow up visits, medications and where to pick up, reasons to contact MD or call 911. Educated on signs and symptoms of infection. All personal belongings taken pt DC to home with mom. Wheelchair to lobby.

## 2023-10-18 NOTE — Plan of Care (Signed)
  Problem: Education: Goal: Knowledge of General Education information will improve Description: Including pain rating scale, medication(s)/side effects and non-pharmacologic comfort measures Outcome: Progressing   Problem: Clinical Measurements: Goal: Ability to maintain clinical measurements within normal limits will improve Outcome: Progressing   Problem: Activity: Goal: Risk for activity intolerance will decrease Outcome: Progressing   Problem: Nutrition: Goal: Adequate nutrition will be maintained Outcome: Progressing   Problem: Pain Managment: Goal: General experience of comfort will improve and/or be controlled Outcome: Progressing   Problem: Metabolic: Goal: Ability to maintain appropriate glucose levels will improve Outcome: Progressing

## 2023-10-18 NOTE — Discharge Summary (Signed)
 Physician Discharge Summary  Darlene Rodriguez FMW:[id number] DOB: Aug 28, 1980 DOA: 10/14/2023  PCP: Patient, No Pcp Per  Admit date: 10/14/2023  Discharge date: 10/18/2023  Admitted From: Home  Disposition:  Home.  Recommendations for Outpatient Follow-up:  Follow up with PCP in 1-2 weeks. Please obtain BMP/CBC in one week. Advised to follow up General surgery as scheduled. Advised daily dressing to the wound. Advised to take augmantin twice dialy for 6 days to complete 10 day course. Advised to take Lantus  20 units and sliding scale , Advised to follow up Endocrinologist as scheduled.  Home Health: None Equipment/Devices: None  Discharge Condition: Stable CODE STATUS: Full code Diet recommendation: Heart Healthy   Brief Summary / Hospital Course: This 43 yrs old female with PMH significant of HTN, Morbid obesity and DM-2 not on meds sent to ED from urgent care due to left middle back abscess /cellulitis that did not improve with outpatient antibiotics including a week of Augmentin  and 4 days of doxycycline .  She was admitted with sepsis due to left buttock cellulitis /abscess. CT abdomen and pelvis raised concern for cellulitis with possible necrotizing fasciitis.  She was taken to the OR and underwent I&D with no significant output.  Necrotizing fasciitis ruled out. Started on broad-spectrum antibiotics, Patient has made significant improvement, general surgery signed off recommended patient can be discharged on Augmentin  twice a day for 6 more days to complete total 10-day course. Patient was not taking any medications for diabetes,  Patient was started on Lantus  20 units daily and sliding scale.  Patient was given prescription for Lantus  and sliding scale,  advised to follow-up with endocrinologist.  Patient feels much better and wants to be discharged home.  Discharge Diagnoses:  Principal Problem:   Sepsis due to cellulitis [facility]) Active Problems:   Morbid obesity (HCC)   Essential  hypertension   Uncontrolled type 2 diabetes mellitus with hyperglycemia, without long-term current use of insulin  [facility])  Discharge Instructions  Discharge Instructions     Call MD for:  difficulty breathing, headache or visual disturbances   Complete by: As directed    Call MD for:  persistant dizziness or light-headedness   Complete by: As directed    Call MD for:  persistant nausea and vomiting   Complete by: As directed    Diet - low sodium heart healthy   Complete by: As directed    Diet general   Complete by: As directed    Discharge instructions   Complete by: As directed    Advised to follow up General surgery as scheduled. Advised daily dressing Advised to take augmantin twice dialy for 6 days to complete 10 day course.   Discharge wound care:   Complete by: As directed    Advised daily dressing.   Increase activity slowly   Complete by: As directed       Allergies as of 10/18/2023       Reactions   Benadryl [diphenhydramine] Shortness Of Breath        Medication List     STOP taking these medications    doxycycline  100 MG capsule Commonly known as: VIBRAMYCIN    predniSONE  10 MG (48) Tbpk tablet Commonly known as: STERAPRED UNI-PAK 48 TAB       TAKE these medications    acetaminophen  500 MG tablet Commonly known as: TYLENOL  Take 1,000 mg by mouth 2 (two) times daily as needed for moderate pain (pain score 4-6) or headache.   amoxicillin -clavulanate 875-125 MG tablet Commonly known  as: AUGMENTIN  Take 1 tablet by mouth 2 (two) times daily for 6 days.   Blood Glucose Monitoring Suppl Devi 1 each by Does not apply route in the morning, at noon, and at bedtime. May substitute to any manufacturer covered by patient's insurance.   BLOOD GLUCOSE TEST STRIPS Strp 1 each by In Vitro route in the morning, at noon, and at bedtime. May substitute to any manufacturer covered by patient's insurance.   ibuprofen  800 MG tablet Commonly known as: ADVIL  Take  1 tablet (800 mg total) by mouth 3 (three) times daily.   insulin  aspart 100 UNIT/ML FlexPen Commonly known as: NOVOLOG  Inject 0-15 Units into the skin 3 (three) times daily with meals.   insulin  glargine 100 UNIT/ML Solostar Pen Commonly known as: LANTUS  Inject 20 Units into the skin daily.   Lancet Device Misc 1 each by Does not apply route in the morning, at noon, and at bedtime. May substitute to any manufacturer covered by patient's insurance.   Lancets Misc. Misc 1 each by Does not apply route in the morning, at noon, and at bedtime. May substitute to any manufacturer covered by patient's insurance.   lisinopril  10 MG tablet Commonly known as: ZESTRIL  Take 10 mg by mouth daily.   oxyCODONE  5 MG immediate release tablet Commonly known as: Oxy IR/ROXICODONE  Take 1-2 tablets (5-10 mg total) by mouth every 4 (four) hours as needed for up to 3 days for moderate pain (pain score 4-6).   senna-docusate 8.6-50 MG tablet Commonly known as: Senokot-S Take 1 tablet by mouth 2 (two) times daily as needed for up to 10 days for moderate constipation.               Discharge Care Instructions  (From admission, onward)           Start     Ordered   10/18/23 0000  Discharge wound care:       Comments: Advised daily dressing.   10/18/23 1119            Follow-up Information     Maczis, Puja Gosai, PA-C. Go on 11/05/2023.   Specialty: General Surgery Why: 9:00 AM for wound check, please arrive 30 min prior to appointment time to check in. Contact information: [address] [place] [facility] [place] [address] [phone]         [facility] at [facility] Follow up on 01/15/2024.   Why: establish primary care with Quintin Sheerer [place] 10:30 am, place on wait list for earlier appointment time Contact information: [address], [place], [place] [address] Phone: [phone]         Dartha Ernst, MD Follow up in 1 week(s).   Specialty: Endocrinology Contact information: [address] [place] [address] [place] [address] [phone]                Allergies  Allergen Reactions   Benadryl [Diphenhydramine] Shortness Of Breath    Consultations: General Surgery   Procedures/Studies: CT ABDOMEN PELVIS W CONTRAST Result Date: 10/14/2023 CLINICAL DATA:  Back pain, cellulitis EXAM: CT ABDOMEN AND PELVIS WITH CONTRAST TECHNIQUE: Multidetector CT imaging of the abdomen and pelvis was performed using the standard protocol following bolus administration of intravenous contrast. RADIATION DOSE REDUCTION: This exam was performed according to the departmental dose-optimization program which includes automated exposure control, adjustment of the mA and/or kV according to patient size and/or use of iterative reconstruction technique. CONTRAST:  75mL OMNIPAQUE  IOHEXOL  350 MG/ML SOLN COMPARISON:  04/03/2020 FINDINGS: Lower chest: No acute pleural or parenchymal lung disease. Hepatobiliary: No focal liver abnormality is seen. Status post cholecystectomy. No biliary dilatation. Pancreas: Unremarkable. No pancreatic ductal dilatation or surrounding inflammatory changes. Spleen: Normal in size without focal abnormality. Adrenals/Urinary Tract: Adrenal glands are unremarkable. Kidneys are normal, without renal calculi, focal lesion, or hydronephrosis. Bladder is unremarkable. Stomach/Bowel: No bowel obstruction or ileus. Normal appendix right lower quadrant. No bowel wall thickening or inflammatory change. Large infraumbilical ventral hernia containing mid transverse colon and distal small bowel. No incarceration or obstruction. Vascular/Lymphatic: No significant vascular findings are present. No enlarged abdominal or pelvic lymph nodes. Reproductive: Uterus and bilateral adnexa are unremarkable. Other: No free fluid or free intraperitoneal gas. Large infraumbilical midline ventral hernia  containing portions of the mid transverse colon and distal small bowel. No incarceration, obstruction, or ischemia. Musculoskeletal: There is extensive subcutaneous fat stranding within the soft tissues of the lower back, greatest to the left of midline at the L2 level. Within the subcutaneous fat stranding in this region there are multiple punctate foci of gas, concerning for infection with gas forming organism. No fluid collection or drainable abscess. No acute or destructive bony abnormalities. Reconstructed images demonstrate no additional findings. IMPRESSION: 1. Extensive cellulitis within the lower back, with punctate foci of gas within the region of inflammation in the left lower back consistent with infection by gas-forming organism or necrotizing fasciitis. 2. Large infraumbilical midline ventral hernia containing portions of the transverse colon and small bowel. No incarceration, obstruction, or ischemia. Electronically Signed   By: Ozell Daring M.D.   On: 10/14/2023 15:25    Subjective: Patient was seen and examined at bedside.  Overnight events noted. Patient reports feeling much better and wants to be discharged.   Patient being discharged on antibiotics and insulin .  Discharge Exam: Vitals:   10/18/23 0755 10/18/23 1523  BP: (!) 169/93 (!) 159/103  Pulse: 94 (!) 101  Resp: 18 18  Temp: 98.2 F (36.8 C) 98.3 F (36.8 C)  SpO2: 98% 98%   Vitals:   10/17/23 2210 10/18/23 0545 10/18/23 0755 10/18/23 1523  BP: (!) 143/82 (!) 141/80 (!) 169/93 (!) 159/103  Pulse: 98 (!) 101 94 (!) 101  Resp: 18 18 18 18   Temp: 98 F (36.7 C) 98.5 F (36.9 C) 98.2 F (36.8 C) 98.3 F (36.8 C)  TempSrc: Oral Oral Oral Oral  SpO2: 98% 98% 98% 98%  Weight:      Height:        General: Pt is alert, awake, not in acute distress Cardiovascular: RRR, S1/S2 +, no rubs, no gallops Respiratory: CTA bilaterally, no wheezing, no rhonchi Abdominal: Soft, NT, ND, bowel sounds + Extremities: no  edema, no cyanosis    The results of significant diagnostics from this hospitalization (including imaging, microbiology, ancillary and laboratory) are listed below for reference.     Microbiology: Recent Results (from the past 240 hours)  Blood Culture (routine x 2)     Status: None (Preliminary result)   Collection Time: 10/14/23 12:15 PM   Specimen: BLOOD  Result Value Ref Range Status   Specimen Description BLOOD LEFT ANTECUBITAL  Final   Special Requests   Final    BOTTLES DRAWN AEROBIC AND ANAEROBIC Blood Culture results may not be optimal due to an inadequate volume of blood received in culture bottles   Culture   Final    NO GROWTH 4 DAYS Performed at [facility]  Hospital Lab, [address] [place], [place] [address]    Report Status PENDING  Incomplete  Blood Culture (routine x 2)     Status: None (Preliminary result)   Collection Time: 10/14/23 12:19 PM   Specimen: BLOOD RIGHT FOREARM  Result Value Ref Range Status   Specimen Description BLOOD RIGHT FOREARM  Final   Special Requests   Final    BOTTLES DRAWN AEROBIC ONLY Blood Culture results may not be optimal due to an inadequate volume of blood received in culture bottles   Culture   Final    NO GROWTH 4 DAYS Performed at [facility], [address], [place], [place] [address]    Report Status PENDING  Incomplete  Resp panel by RT-PCR (RSV, Flu A&B, Covid) Anterior Nasal Swab     Status: None   Collection Time: 10/14/23 12:21 PM   Specimen: Anterior Nasal Swab  Result Value Ref Range Status   SARS Coronavirus 2 by RT PCR NEGATIVE NEGATIVE Final   Influenza A by PCR NEGATIVE NEGATIVE Final   Influenza B by PCR NEGATIVE NEGATIVE Final    Comment: (NOTE) The Xpert Xpress SARS-CoV-2/FLU/RSV plus assay is intended as an aid in the diagnosis of influenza from Nasopharyngeal swab specimens and should not be used as a sole basis for treatment. Nasal washings and aspirates are unacceptable for Xpert Xpress  SARS-CoV-2/FLU/RSV testing.  Fact Sheet for Patients: BloggerCourse.com  Fact Sheet for Healthcare Providers: SeriousBroker.it  This test is not yet approved or cleared by the [place]  FDA and has been authorized for detection and/or diagnosis of SARS-CoV-2 by FDA under an Emergency Use Authorization (EUA). This EUA will remain in effect (meaning this test can be used) for the duration of the COVID-19 declaration under Section 564(b)(1) of the Act, 21 U.S.C. section 360bbb-3(b)(1), unless the authorization is terminated or revoked.     Resp Syncytial Virus by PCR NEGATIVE NEGATIVE Final    Comment: (NOTE) Fact Sheet for Patients: BloggerCourse.com  Fact Sheet for Healthcare Providers: SeriousBroker.it  This test is not yet approved or cleared by the [place]  FDA and has been authorized for detection and/or diagnosis of SARS-CoV-2 by FDA under an Emergency Use Authorization (EUA). This EUA will remain in effect (meaning this test can be used) for the duration of the COVID-19 declaration under Section 564(b)(1) of the Act, 21 U.S.C. section 360bbb-3(b)(1), unless the authorization is terminated or revoked.  Performed at [facility], [address] [place], [place] [address]   Aerobic/Anaerobic Culture w Gram Stain (surgical/deep wound)     Status: None (Preliminary result)   Collection Time: 10/14/23  5:30 PM   Specimen: Wound  Result Value Ref Range Status   Specimen Description ABSCESS BACK  Final   Special Requests NONE  Final   Gram Stain   Final    NO WBC SEEN RARE GRAM POSITIVE COCCI Performed at [facility], [address], [place], [place] [address]    Culture   Final    MODERATE STAPHYLOCOCCUS AUREUS NO ANAEROBES ISOLATED; CULTURE IN PROGRESS FOR 5 DAYS    Report Status PENDING  Incomplete   Organism ID, Bacteria STAPHYLOCOCCUS AUREUS   Final      Susceptibility   Staphylococcus aureus - MIC*    CIPROFLOXACIN <=0.5 SENSITIVE Sensitive     ERYTHROMYCIN  <=0.25 SENSITIVE Sensitive     GENTAMICIN <=0.5 SENSITIVE Sensitive     OXACILLIN <=0.25 SENSITIVE Sensitive     TETRACYCLINE <=1  SENSITIVE Sensitive     VANCOMYCIN  2 SENSITIVE Sensitive     TRIMETH/SULFA <=10 SENSITIVE Sensitive     CLINDAMYCIN  <=0.25 SENSITIVE Sensitive     RIFAMPIN <=0.5 SENSITIVE Sensitive     Inducible Clindamycin  NEGATIVE Sensitive     LINEZOLID 2 SENSITIVE Sensitive     * MODERATE STAPHYLOCOCCUS AUREUS     Labs: BNP (last 3 results) No results for input(s): BNP in the last 8760 hours. Basic Metabolic Panel: Recent Labs  Lab 10/14/23 1124 10/15/23 0355 10/16/23 0523  NA 136 132* 138  K 3.6 3.9 3.5  CL 101 99 99  CO2 23 23 25   GLUCOSE 285* 279* 178*  BUN 10 6 9   CREATININE 0.58 0.63 0.63  CALCIUM 9.3 8.4* 9.0  MG  --   --  1.8  PHOS  --   --  4.9*   Liver Function Tests: Recent Labs  Lab 10/14/23 1124 10/16/23 0523  AST 11*  --   ALT 12  --   ALKPHOS 98  --   BILITOT 0.9  --   PROT 7.1  --   ALBUMIN 3.0* 2.9*   No results for input(s): LIPASE, AMYLASE in the last 168 hours. No results for input(s): AMMONIA in the last 168 hours. CBC: Recent Labs  Lab 10/14/23 1124 10/15/23 0355 10/16/23 0523 10/17/23 0750  WBC 18.9* 17.5* 11.3* 7.5  NEUTROABS 14.8*  --   --   --   HGB 14.5 11.9* 12.7 11.8*  HCT 43.7 36.0 38.3 35.7*  MCV 90.3 90.9 91.4 91.1  PLT 357 324 326 232   Cardiac Enzymes: No results for input(s): CKTOTAL, CKMB, CKMBINDEX, TROPONINI in the last 168 hours. BNP: Invalid input(s): POCBNP CBG: Recent Labs  Lab 10/17/23 1624 10/17/23 2306 10/18/23 0853 10/18/23 1141 10/18/23 1525  GLUCAP 168* 172* 167* 244* 151*   D-Dimer No results for input(s): DDIMER in the last 72 hours. Hgb A1c No results for input(s): HGBA1C in the last 72 hours. Lipid Profile No results for input(s):  CHOL, HDL, LDLCALC, TRIG, CHOLHDL, LDLDIRECT in the last 72 hours. Thyroid function studies No results for input(s): TSH, T4TOTAL, T3FREE, THYROIDAB in the last 72 hours.  Invalid input(s): FREET3 Anemia work up No results for input(s): VITAMINB12, FOLATE, FERRITIN, TIBC, IRON, RETICCTPCT in the last 72 hours. Urinalysis    Component Value Date/Time   COLORURINE YELLOW 04/03/2020 1710   APPEARANCEUR CLEAR 04/03/2020 1710   LABSPEC 1.022 04/03/2020 1710   PHURINE 6.0 04/03/2020 1710   GLUCOSEU >=500 (A) 04/03/2020 1710   HGBUR NEGATIVE 04/03/2020 1710   BILIRUBINUR NEGATIVE 04/03/2020 1710   KETONESUR NEGATIVE 04/03/2020 1710   PROTEINUR NEGATIVE 04/03/2020 1710   UROBILINOGEN 1.0 11/19/2008 2308   NITRITE NEGATIVE 04/03/2020 1710   LEUKOCYTESUR NEGATIVE 04/03/2020 1710   Sepsis Labs Recent Labs  Lab 10/14/23 1124 10/15/23 0355 10/16/23 0523 10/17/23 0750  WBC 18.9* 17.5* 11.3* 7.5   Microbiology Recent Results (from the past 240 hours)  Blood Culture (routine x 2)     Status: None (Preliminary result)   Collection Time: 10/14/23 12:15 PM   Specimen: BLOOD  Result Value Ref Range Status   Specimen Description BLOOD LEFT ANTECUBITAL  Final   Special Requests   Final    BOTTLES DRAWN AEROBIC AND ANAEROBIC Blood Culture results may not be optimal due to an inadequate volume of blood received in culture bottles   Culture   Final    NO GROWTH 4 DAYS Performed at [facility]  Lab, [address], [place], [place] [address]    Report Status PENDING  Incomplete  Blood Culture (routine x 2)     Status: None (Preliminary result)   Collection Time: 10/14/23 12:19 PM   Specimen: BLOOD RIGHT FOREARM  Result Value Ref Range Status   Specimen Description BLOOD RIGHT FOREARM  Final   Special Requests   Final    BOTTLES DRAWN AEROBIC ONLY Blood Culture results may not be optimal due to an inadequate volume of blood received in culture bottles    Culture   Final    NO GROWTH 4 DAYS Performed at [facility], [address], [place], [place] [address]    Report Status PENDING  Incomplete  Resp panel by RT-PCR (RSV, Flu A&B, Covid) Anterior Nasal Swab     Status: None   Collection Time: 10/14/23 12:21 PM   Specimen: Anterior Nasal Swab  Result Value Ref Range Status   SARS Coronavirus 2 by RT PCR NEGATIVE NEGATIVE Final   Influenza A by PCR NEGATIVE NEGATIVE Final   Influenza B by PCR NEGATIVE NEGATIVE Final    Comment: (NOTE) The Xpert Xpress SARS-CoV-2/FLU/RSV plus assay is intended as an aid in the diagnosis of influenza from Nasopharyngeal swab specimens and should not be used as a sole basis for treatment. Nasal washings and aspirates are unacceptable for Xpert Xpress SARS-CoV-2/FLU/RSV testing.  Fact Sheet for Patients: BloggerCourse.com  Fact Sheet for Healthcare Providers: SeriousBroker.it  This test is not yet approved or cleared by the [place]  FDA and has been authorized for detection and/or diagnosis of SARS-CoV-2 by FDA under an Emergency Use Authorization (EUA). This EUA will remain in effect (meaning this test can be used) for the duration of the COVID-19 declaration under Section 564(b)(1) of the Act, 21 U.S.C. section 360bbb-3(b)(1), unless the authorization is terminated or revoked.     Resp Syncytial Virus by PCR NEGATIVE NEGATIVE Final    Comment: (NOTE) Fact Sheet for Patients: BloggerCourse.com  Fact Sheet for Healthcare Providers: SeriousBroker.it  This test is not yet approved or cleared by the [place]  FDA and has been authorized for detection and/or diagnosis of SARS-CoV-2 by FDA under an Emergency Use Authorization (EUA). This EUA will remain in effect (meaning this test can be used) for the duration of the COVID-19 declaration under Section 564(b)(1) of the Act, 21  U.S.C. section 360bbb-3(b)(1), unless the authorization is terminated or revoked.  Performed at [facility] Lab, [address], [place], [place] [address]   Aerobic/Anaerobic Culture w Gram Stain (surgical/deep wound)     Status: None (Preliminary result)   Collection Time: 10/14/23  5:30 PM   Specimen: Wound  Result Value Ref Range Status   Specimen Description ABSCESS BACK  Final   Special Requests NONE  Final   Gram Stain   Final    NO WBC SEEN RARE GRAM POSITIVE COCCI Performed at [facility], [address] [place], [place] [address]    Culture   Final    MODERATE STAPHYLOCOCCUS AUREUS NO ANAEROBES ISOLATED; CULTURE IN PROGRESS FOR 5 DAYS    Report Status PENDING  Incomplete   Organism ID, Bacteria STAPHYLOCOCCUS AUREUS  Final      Susceptibility   Staphylococcus aureus - MIC*    CIPROFLOXACIN <=0.5 SENSITIVE Sensitive     ERYTHROMYCIN  <=0.25 SENSITIVE Sensitive     GENTAMICIN <=0.5 SENSITIVE Sensitive     OXACILLIN <=0.25 SENSITIVE Sensitive     TETRACYCLINE <=1 SENSITIVE  Sensitive     VANCOMYCIN  2 SENSITIVE Sensitive     TRIMETH/SULFA <=10 SENSITIVE Sensitive     CLINDAMYCIN  <=0.25 SENSITIVE Sensitive     RIFAMPIN <=0.5 SENSITIVE Sensitive     Inducible Clindamycin  NEGATIVE Sensitive     LINEZOLID 2 SENSITIVE Sensitive     * MODERATE STAPHYLOCOCCUS AUREUS     Time coordinating discharge: Over 30 minutes  SIGNED:   Darcel Dawley, MD  [facility] 10/18/2023, 4:41 PM Pager   If 7PM-7AM, please contact night-coverage

## 2023-10-18 NOTE — Progress Notes (Addendum)
 Unable to Discharge at this time  Per RN.    Family need wound/dressing care instructions.  Family arrival ETA is after 1900. Med List completed for review and update as needed and AVS printed and placed in chart at RN's request.

## 2023-10-18 NOTE — Progress Notes (Deleted)
 Per CM.   RW order has been placed and will be sent to Discharge Lounge. Then Patient will call ride once RW has arrived.

## 2023-10-18 NOTE — Progress Notes (Signed)
 Progress Note  4 Days Post-Op  Subjective: Pain is manageable. Has not needed pain medication. Denies new concerns or worsening symptoms.   Patient's husband is coming today to have education of dressing changes.   ROS  All negative with the exception of above.  Objective: Vital signs in last 24 hours: Temp:  [98 F (36.7 C)-98.5 F (36.9 C)] 98.2 F (36.8 C) (09/12 0755) Pulse Rate:  [94-109] 94 (09/12 0755) Resp:  [16-18] 18 (09/12 0755) BP: (141-169)/(80-93) 169/93 (09/12 0755) SpO2:  [97 %-98 %] 98 % (09/12 0755) Last BM Date : 10/14/23  Intake/Output from previous day: 09/11 0701 - 09/12 0700 In: 1672.9 [P.O.:480; IV Piggyback:1192.9] Out: -  Intake/Output this shift: No intake/output data recorded.  PE: General: Pleasant female sitting in chair eating breakfast. Lungs: Respiratory effort nonlabored MS: all 4 extremities are symmetrical. Skin: Warm and dry. Wound incision of left back covered with dressing.   Lab Results:  Recent Labs    10/16/23 0523 10/17/23 0750  WBC 11.3* 7.5  HGB 12.7 11.8*  HCT 38.3 35.7*  PLT 326 232   BMET Recent Labs    10/16/23 0523  NA 138  K 3.5  CL 99  CO2 25  GLUCOSE 178*  BUN 9  CREATININE 0.63  CALCIUM 9.0   PT/INR No results for input(s): LABPROT, INR in the last 72 hours. CMP     Component Value Date/Time   NA 138 10/16/2023 0523   K 3.5 10/16/2023 0523   CL 99 10/16/2023 0523   CO2 25 10/16/2023 0523   GLUCOSE 178 (H) 10/16/2023 0523   BUN 9 10/16/2023 0523   CREATININE 0.63 10/16/2023 0523   CALCIUM 9.0 10/16/2023 0523   PROT 7.1 10/14/2023 1124   ALBUMIN 2.9 (L) 10/16/2023 0523   AST 11 (L) 10/14/2023 1124   ALT 12 10/14/2023 1124   ALKPHOS 98 10/14/2023 1124   BILITOT 0.9 10/14/2023 1124   GFRNONAA >60 10/16/2023 0523   GFRAA >60 02/08/2016 1225   Lipase     Component Value Date/Time   LIPASE 27 04/03/2020 1645       Studies/Results: No results  found.  Anti-infectives: Anti-infectives (From admission, onward)    Start     Dose/Rate Route Frequency Ordered Stop   10/15/23 1200  cefTRIAXone  (ROCEPHIN ) 2 g in sodium chloride  0.9 % 100 mL IVPB        2 g 200 mL/hr over 30 Minutes Intravenous Every 24 hours 10/14/23 1849     10/15/23 0900  vancomycin  (VANCOREADY) IVPB 1250 mg/250 mL        1,250 mg 166.7 mL/hr over 90 Minutes Intravenous Every 12 hours 10/14/23 1950     10/14/23 2045  vancomycin  (VANCOREADY) IVPB 1500 mg/300 mL        1,500 mg 150 mL/hr over 120 Minutes Intravenous  Once 10/14/23 1947 10/15/23 0047   10/14/23 1900  cefTRIAXone  (ROCEPHIN ) 2 g in sodium chloride  0.9 % 100 mL IVPB  Status:  Discontinued        2 g 200 mL/hr over 30 Minutes Intravenous Every 24 hours 10/14/23 1849 10/14/23 1849   10/14/23 1600  clindamycin  (CLEOCIN ) IVPB 600 mg        600 mg 100 mL/hr over 30 Minutes Intravenous  Once 10/14/23 1551 10/14/23 1720   10/14/23 1215  cefTRIAXone  (ROCEPHIN ) 2 g in sodium chloride  0.9 % 100 mL IVPB        2 g 200 mL/hr over 30 Minutes Intravenous  Once 10/14/23 1200 10/14/23 1303   10/14/23 1215  vancomycin  (VANCOCIN ) IVPB 1000 mg/200 mL premix        1,000 mg 200 mL/hr over 60 Minutes Intravenous  Once 10/14/23 1200 10/14/23 1329        Assessment/Plan POD4: S/P incision and drainage of left back abscess by Dr. Vicenta Poli on 10/14/2023 -Afebrile. Hypertensive. Hemodynamically stable. -WBC 7.5 on 9/11; HGB 11.8 -Culture w Gram stain from 9/8 pending: no WBC seen, rare gram positive cocci; Moderate staphylococcus aureus no anaerobes isolated: Preliminary report -Can transition to oral antibiotics. Recommend 10 day total course (will need 6 more days worth). -Pain is manageable. -Continue wet to dry dressing changes BID. Okay to shower. Can arrange dressing changes around showers.  -Discussed dressing care in great detail. Patient's husband coming today to have education to assist with changes at  home. Will provide outpatient follow up information. Patient stable for discharge from general surgery standpoint when medically cleared. Please call for further concerns or questions.   FEN: Carb modified; IVF per primary team VTE: SCDs ID: Ceftriaxone //Vancomycin  currently; Can transition to PO antibiotics for discharge. Recommend a 10 day total course.     LOS: 4 days   I reviewed nursing notes, hospitalist notes, last 24 h vitals and pain scores, last 48 h intake and output, last 24 h labs and trends, and last 24 h imaging results.   Marjorie Carlyon Favre, [facility] 10/18/2023, 9:50 AM Please see Amion for pager number during day hours 7:00am-4:30pm

## 2023-10-19 LAB — CULTURE, BLOOD (ROUTINE X 2)
Culture: NO GROWTH
Culture: NO GROWTH

## 2023-10-19 LAB — AEROBIC/ANAEROBIC CULTURE W GRAM STAIN (SURGICAL/DEEP WOUND): Gram Stain: NONE SEEN

## 2023-10-21 ENCOUNTER — Telehealth (HOSPITAL_BASED_OUTPATIENT_CLINIC_OR_DEPARTMENT_OTHER): Payer: Self-pay | Admitting: *Deleted

## 2023-10-21 NOTE — Telephone Encounter (Signed)
 Copied from CRM #[id number]. Topic: Appointments - Appointment Scheduling >> Oct 18, 2023 12:29 PM Kevelyn M wrote: New Patient was scheduled for December 10th but needs a hospital follow-up appointment within 14 days. Please call patient for a sooner appointment if possible # [phone]

## 2023-10-21 NOTE — Telephone Encounter (Signed)
 Unfortunately I know this is first available new patient appt but in the event something opens sooner please let patient know

## 2023-11-19 LAB — FUNGUS CULTURE RESULT

## 2023-11-19 LAB — FUNGAL ORGANISM REFLEX

## 2023-11-19 LAB — FUNGUS CULTURE WITH STAIN

## 2024-01-15 ENCOUNTER — Encounter (HOSPITAL_BASED_OUTPATIENT_CLINIC_OR_DEPARTMENT_OTHER): Payer: Self-pay | Admitting: Family Medicine

## 2024-01-15 ENCOUNTER — Ambulatory Visit (INDEPENDENT_AMBULATORY_CARE_PROVIDER_SITE_OTHER): Payer: Self-pay | Admitting: Family Medicine

## 2024-01-15 VITALS — BP 175/106 | HR 99 | Temp 98.1°F | Resp 20 | Ht 62.0 in | Wt 325.0 lb

## 2024-01-15 DIAGNOSIS — I10 Essential (primary) hypertension: Secondary | ICD-10-CM

## 2024-01-15 DIAGNOSIS — E119 Type 2 diabetes mellitus without complications: Secondary | ICD-10-CM | POA: Insufficient documentation

## 2024-01-15 DIAGNOSIS — E1165 Type 2 diabetes mellitus with hyperglycemia: Secondary | ICD-10-CM

## 2024-01-15 MED ORDER — METFORMIN HCL ER 500 MG PO TB24: 500.0000 mg | ORAL_TABLET | Freq: Two times a day (BID) | ORAL | 1 refills | Status: DC

## 2024-01-15 MED ORDER — TIRZEPATIDE 2.5 MG/0.5ML ~~LOC~~ SOAJ: 2.5000 mg | SUBCUTANEOUS | 1 refills | Status: DC

## 2024-01-15 MED ORDER — LISINOPRIL 10 MG PO TABS: 10.0000 mg | ORAL_TABLET | Freq: Every day | ORAL | 1 refills | Status: AC

## 2024-01-15 NOTE — Assessment & Plan Note (Signed)
 We can proceed with labs today as below.  Can continue with metformin  at this time. Most recent A1c was above goal.  Likely will need to adjust pharmacotherapy.  We discussed options. She initially had some hesitancy with proceeding with injectable medication, however she is open to this.  She does not currently have insurance in place which does complicate things in regards to medication management.  We will look to see about getting assistance and setting up insurance as well as possible patient assistance program.  Ultimately, if this is not able to be utilized, we will need to consider alternative oral medications to help with better controlling blood sugars. Foot exam completed today, documented in chart Urine ACR to be completed today

## 2024-01-15 NOTE — Assessment & Plan Note (Signed)
 Blood pressure elevated in office today, she reports that blood pressure at home is typically better controlled. We can continue with current medication regimen.  Recommend that she bring home blood pressure log with her at her next appointment.  Can also bring her home blood pressure cuff to next appointment for us  to compare here in the office. Recommend intermittent monitoring of blood pressure at home, DASH diet

## 2024-01-15 NOTE — Progress Notes (Signed)
 New Patient Office Visit  Subjective   Patient ID: Darlene Rodriguez, female    DOB: 1980/09/30  Age: 43 y.o. MRN: [id number]  CC:  Chief Complaint  Patient presents with   Establish Care   Hospitalization Follow-up   Cellulitis    HPI Darlene Rodriguez presents to establish care Last PCP - Cloretta, has been awhile  Discussed the use of AI scribe software for clinical note transcription with the patient, who gave verbal consent to proceed.  History of Present Illness Darlene Rodriguez is a 43 year old female who presents with concerns about weight management and emotional support animal documentation.  She experiences significant anxiety and stress, particularly at home, and is interested in obtaining a letter for her dog to be recognized as an emotional support animal. Her dog, which has been with her for eleven years, helps her relax.  She is concerned about her weight, currently at 325 pounds, and has tried various weight loss methods including diet pills, low-carb diets, and calorie deficit without success. 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  She is currently taking lisinopril  for hypertension and metformin  for diabetes management. Her A1c was previously reduced to 6.0 but increased to 7.2 after stopping metformin . She dislikes injections and prefers oral medication. She monitors her blood pressure at home, with readings around 115/82, though she experiences elevated readings in clinical settings, attributed to 'white coat syndrome'.  She does not have health insurance and is interested in exploring non-injectable options for weight management and diabetes control. She is open to considering counseling or therapy to manage her anxiety and stress, especially when she is at work and unable to have her dog with her.  She works at a daycare, [employer], and enjoys activities such as playing with her dog, shopping,  and swimming. She is originally from [place] and has not left the area.  Patient is originally from [place]. She works at [employer] - daycare. She enjoys taking dog to the park, swimming.  Outpatient Encounter Medications as of 01/15/2024  Medication Sig   acetaminophen  (TYLENOL ) 500 MG tablet Take 1,000 mg by mouth 2 (two) times daily as needed for moderate pain (pain score 4-6) or headache.   Blood Glucose Monitoring Suppl DEVI 1 each by Does not apply route in the morning, at noon, and at bedtime. May substitute to any manufacturer covered by patient's insurance.   ibuprofen  (ADVIL ) 800 MG tablet Take 1 tablet (800 mg total) by mouth 3 (three) times daily.   tirzepatide (MOUNJARO) 2.5 MG/0.5ML Pen Inject 2.5 mg into the skin once a week.   [DISCONTINUED] lisinopril  (ZESTRIL ) 10 MG tablet Take 10 mg by mouth daily.   [DISCONTINUED] metFORMIN  (GLUCOPHAGE -XR) 500 MG 24 hr tablet Take 500 mg by mouth 2 (two) times daily with a meal.   lisinopril  (ZESTRIL ) 10 MG tablet Take 1 tablet (10 mg total) by mouth daily.   metFORMIN  (GLUCOPHAGE -XR) 500 MG 24 hr tablet Take 1 tablet (500 mg total) by mouth 2 (two) times daily with a meal.   [DISCONTINUED] insulin  aspart (NOVOLOG ) 100 UNIT/ML FlexPen Inject 0-15 Units into the skin 3 (three) times daily with meals. (Patient not taking: Reported on 01/15/2024)   [DISCONTINUED] insulin  glargine (LANTUS ) 100 UNIT/ML Solostar Pen Inject 20 Units into the skin daily. (Patient not taking: Reported on 01/15/2024)   No facility-administered encounter medications on file as of 01/15/2024.    Past Medical History:  Diagnosis Date   Diabetes (HCC)    Hypertension     Past Surgical History:  Procedure Laterality Date   CESAREAN SECTION     INCISION AND DRAINAGE OF WOUND N/A 10/14/2023   Procedure: IRRIGATION AND DEBRIDEMENT WOUND;  Surgeon: Vernetta Berg, MD;  Location: [facility] OR;  Service: General;  Laterality: N/A;   TONSILLECTOMY      Family  History  Problem Relation Age of Onset   Lung cancer Father    Heart disease Maternal Grandfather     Social History   Socioeconomic History   Marital status: Single    Spouse name: Not on file   Number of children: 1   Years of education: 12   Highest education level: Not on file  Occupational History   Occupation: Child Care  Tobacco Use   Smoking status: Never    Passive exposure: Never   Smokeless tobacco: Never  Vaping Use   Vaping status: Never Used  Substance and Sexual Activity   Alcohol use: No   Drug use: No   Sexual activity: Not Currently  Other Topics Concern   Not on file  Social History Narrative   Born and raised in [place], [place]. Currently resides in a house with her daughter. No pets. Fun: Shopping, swimming, bowling, anything outdoors.   Denies religious beliefs that would effect health care.    Social Drivers of Health   Financial Resource Strain: Unknown (04/09/2021)   Received from [facility]   Overall Financial Resource Strain (CARDIA)    Difficulty of Paying Living Expenses: Patient declined  Food Insecurity: No Food Insecurity (10/14/2023)   Hunger Vital Sign    Worried About Running Out of Food in the Last Year: Never true    Ran Out of Food in the Last Year: Never true  Transportation Needs: No Transportation Needs (10/14/2023)   PRAPARE - Administrator, Civil Service (Medical): No    Lack of Transportation (Non-Medical): No  Physical Activity: Sufficiently Active (04/09/2021)   Received from [facility]   Exercise Vital Sign    On average, how many days per week do you engage in moderate to strenuous exercise (like a brisk walk)?: 6 days    On average, how many minutes do you engage in exercise at this level?: 30 min  Stress: Unknown (04/09/2021)   Received from [facility] - Occupational Stress Questionnaire    Feeling of Stress : Patient declined  Social Connections: Unknown  (04/09/2021)   Received from [facility]   Social Connection and Isolation Panel    In a typical week, how many times do you talk on the phone with family, friends, or neighbors?: Patient declined    How often do you get together with friends or relatives?: Patient declined    How often do you attend church or religious services?: Patient declined    Do you belong to any clubs or organizations such as church groups, unions, fraternal or athletic groups, or school groups?: Patient declined    How often do you attend meetings of the clubs or organizations you belong to?: Patient declined    Are you married, widowed, divorced, separated, never married, or living with a partner?: Patient declined  Intimate Partner Violence: Not At Risk (10/14/2023)   Humiliation, Afraid, Rape, and Kick questionnaire    Fear of Current or Ex-Partner: No    Emotionally Abused: No    Physically Abused: No  Sexually Abused: No    Objective   BP (!) 175/106 (BP Location: Left Arm, Patient Position: Sitting, Cuff Size: Large)   Pulse 99   Temp 98.1 F (36.7 C) (Oral)   Resp 20   Ht 5' 2 (1.575 m)   Wt (!) 325 lb (147.4 kg)   LMP 12/29/2023 (Approximate)   SpO2 98%   BMI 59.44 kg/m   Physical Exam  43 year old female in no acute distress Cardiovascular exam with regular rate and rhythm, lungs clear to auscultation bilaterally  Assessment & Plan:   Essential hypertension Assessment & Plan: Blood pressure elevated in office today, she reports that blood pressure at home is typically better controlled. We can continue with current medication regimen.  Recommend that she bring home blood pressure log with her at her next appointment.  Can also bring her home blood pressure cuff to next appointment for us  to compare here in the office. Recommend intermittent monitoring of blood pressure at home, DASH diet  Orders: -     Lisinopril ; Take 1 tablet (10 mg total) by mouth daily.  Dispense: 90 tablet;  Refill: 1 -     CBC with Differential/Platelet -     Basic metabolic panel with GFR -     Microalbumin / creatinine urine ratio  Type 2 diabetes mellitus with hyperglycemia, without long-term current use of insulin  [facility] Assessment & Plan: We can proceed with labs today as below.  Can continue with metformin  at this time. Most recent A1c was above goal.  Likely will need to adjust pharmacotherapy.  We discussed options. She initially had some hesitancy with proceeding with injectable medication, however she is open to this.  She does not currently have insurance in place which does complicate things in regards to medication management.  We will look to see about getting assistance and setting up insurance as well as possible patient assistance program.  Ultimately, if this is not able to be utilized, we will need to consider alternative oral medications to help with better controlling blood sugars. Foot exam completed today, documented in chart Urine ACR to be completed today  Orders: -     metFORMIN  HCl ER; Take 1 tablet (500 mg total) by mouth 2 (two) times daily with a meal.  Dispense: 180 tablet; Refill: 1 -     AMB Referral VBCI Care Management -     Tirzepatide; Inject 2.5 mg into the skin once a week.  Dispense: 2 mL; Refill: 1 -     Hemoglobin A1c -     Basic metabolic panel with GFR -     Microalbumin / creatinine urine ratio  Note provided for emotional support animal for patient.  Return in about 6 weeks (around 02/26/2024) for diabetes, hypertension, med check.    ___________________________________________ Ilham Roughton de [place], MD, ABFM, [facility]

## 2024-01-16 ENCOUNTER — Encounter (HOSPITAL_BASED_OUTPATIENT_CLINIC_OR_DEPARTMENT_OTHER): Payer: Self-pay | Admitting: Family Medicine

## 2024-01-16 ENCOUNTER — Ambulatory Visit (HOSPITAL_BASED_OUTPATIENT_CLINIC_OR_DEPARTMENT_OTHER): Payer: Self-pay | Admitting: Family Medicine

## 2024-01-16 LAB — BASIC METABOLIC PANEL WITH GFR
BUN/Creatinine Ratio: 21 (ref 9–23)
BUN: 12 mg/dL (ref 6–24)
CO2: 24 mmol/L (ref 20–29)
Calcium: 9.5 mg/dL (ref 8.7–10.2)
Chloride: 101 mmol/L (ref 96–106)
Creatinine, Ser: 0.57 mg/dL (ref 0.57–1.00)
Glucose: 200 mg/dL — ABNORMAL HIGH (ref 70–99)
Potassium: 4.4 mmol/L (ref 3.5–5.2)
Sodium: 138 mmol/L (ref 134–144)
eGFR: 115 mL/min/1.73 (ref >59)

## 2024-01-16 LAB — CBC WITH DIFFERENTIAL/PLATELET
Basophils Absolute: 0.1 x10E3/uL (ref 0.0–0.2)
Basos: 1 %
EOS (ABSOLUTE): 0.1 x10E3/uL (ref 0.0–0.4)
Eos: 1 %
Hematocrit: 41 % (ref 34.0–46.6)
Hemoglobin: 13.4 g/dL (ref 11.1–15.9)
Immature Grans (Abs): 0.1 x10E3/uL (ref 0.0–0.1)
Immature Granulocytes: 1 %
Lymphocytes Absolute: 1.9 x10E3/uL (ref 0.7–3.1)
Lymphs: 19 %
MCH: 29.9 pg (ref 26.6–33.0)
MCHC: 32.7 g/dL (ref 31.5–35.7)
MCV: 92 fL (ref 79–97)
Monocytes Absolute: 0.6 x10E3/uL (ref 0.1–0.9)
Monocytes: 6 %
Neutrophils Absolute: 7.2 x10E3/uL — ABNORMAL HIGH (ref 1.4–7.0)
Neutrophils: 72 %
Platelets: 316 x10E3/uL (ref 150–450)
RBC: 4.48 x10E6/uL (ref 3.77–5.28)
RDW: 12.7 % (ref 11.7–15.4)
WBC: 9.8 x10E3/uL (ref 3.4–10.8)

## 2024-01-16 LAB — MICROALBUMIN / CREATININE URINE RATIO
Creatinine, Urine: 81 mg/dL
Microalb/Creat Ratio: 15 mg/g{creat} (ref 0–29)
Microalbumin, Urine: 12.4 ug/mL

## 2024-01-16 LAB — HEMOGLOBIN A1C
Est. average glucose Bld gHb Est-mCnc: 203 mg/dL
Hgb A1c MFr Bld: 8.7 % — ABNORMAL HIGH (ref 4.8–5.6)

## 2024-01-22 ENCOUNTER — Telehealth: Payer: Self-pay

## 2024-01-22 NOTE — Progress Notes (Signed)
 Complex Care Management Note Care Guide Note  01/22/2024 Name: Darlene Rodriguez MRN: [id number] DOB: 09/16/1980   Complex Care Management Outreach Attempts: An unsuccessful telephone outreach was attempted today to offer the patient information about available complex care management services.  Follow Up Plan:  Additional outreach attempts will be made to offer the patient complex care management information and services.   Encounter Outcome:  No Answer  [name] [facility]  [facility], [facility] VBCI Assistant Direct Dial: [phone]  Fax: [phone]

## 2024-01-22 NOTE — Progress Notes (Signed)
 Complex Care Management Note  Care Guide Note 01/22/2024 Name: Nala Kachel MRN: [id number] DOB: 04-Mar-1980  Victoriya Roseanne Juenger is a 43 y.o. year old female who sees de Cuba, Quintin PARAS, MD for primary care. I reached out to [employer] by phone today to offer complex care management services.  Ms. Mangen was given information about Complex Care Management services today including:   The Complex Care Management services include support from the care team which includes your Nurse Care Manager, Clinical Social Worker, or Pharmacist.  The Complex Care Management team is here to help remove barriers to the health concerns and goals most important to you. Complex Care Management services are voluntary, and the patient may decline or stop services at any time by request to their care team member.   Complex Care Management Consent Status: Patient agreed to services and verbal consent obtained.   Follow up plan:  Telephone appointment with complex care management team member scheduled for:  01/27/24 at 1:00 p.m.   Encounter Outcome:  Patient Scheduled  [name] [facility]  [facility], [facility] VBCI Assistant Direct Dial: [phone]  Fax: [phone]

## 2024-01-27 ENCOUNTER — Other Ambulatory Visit: Payer: Self-pay

## 2024-01-27 DIAGNOSIS — E1165 Type 2 diabetes mellitus with hyperglycemia: Secondary | ICD-10-CM

## 2024-01-27 NOTE — Progress Notes (Signed)
" ° °  01/27/2024 Name: Darlene Rodriguez MRN: [id number] DOB: 1980/11/17  Chief Complaint  Patient presents with   Diabetes   Medication Assistance    Darlene Rodriguez is a 43 y.o. year old female who presented for a telephone visit.   They were referred to the pharmacist by their PCP for assistance in managing medication access.    Subjective:  Care Team: Primary Care Provider: de Cuba, Quintin PARAS, MD ; Next Scheduled Visit:   Future Appointments  Date Time Provider Department Center  03/04/2024 10:10 AM de Cuba, Raymond J, MD [facility] [phone] [place]     Medication Access/Adherence  Current Pharmacy:  [id number] [place], [place] - [phone] AT [facility] OF [facility] & [address] [place] [address] Phone: [phone] Fax: [phone]  [facility] #[id number] [place], [place] - [address] AT [facility] OF [address] & [address] [place] [address] Phone: [phone] Fax: [phone]  [facility] [address], [place] - [address] [place] [address] Phone: [phone] Fax: [phone]   Patient reports affordability concerns with their medications: Yes  Patient reports access/transportation concerns to their pharmacy: No  Patient reports adherence concerns with their medications:  Yes - related to cost of medications and not having insurance.   Has only been on metformin  outpatient, had previously been on long acting insulin  but this was just during hospital stay.   Rx for mounjaro  previously sent into to pharmacy however patient reports cost would be $1500 dollars cash price which is not financially feasible.   Objective:  Lab Results  Component Value Date   HGBA1C 8.7 (H) 01/15/2024    Lab Results  Component Value Date   CREATININE 0.57 01/15/2024   BUN 12 01/15/2024   NA 138 01/15/2024   K 4.4 01/15/2024   CL 101  01/15/2024   CO2 24 01/15/2024    Assessment/Plan:   Medication Access/Adherence - reviewed financial assistance options through PAP - including ozempic through [facility]isk, trulicity through [facility] starting 2026, or prescription hope for trulicity  -reviewed medicaid eligibility - states previously considered but was told at the time she would not be eligible  - provided direct line should there be any questions moving forward.   Follow Up Plan: patient will get sent ozempic PAP papers, will coordinate with CPhT, patient may also consider at least applying for prescription hope to see if she could be eligible to receive trulicity that route.   Lang Sieve, PharmD, BCGP Clinical Pharmacist  [phone]   "

## 2024-01-28 ENCOUNTER — Telehealth: Payer: Self-pay

## 2024-01-28 NOTE — Telephone Encounter (Signed)
 Filled and faxed provider portion pap Novo Nordisk Ozempic to provider office to sign and date, pt portion done online.pt is uninsured.

## 2024-02-05 ENCOUNTER — Other Ambulatory Visit: Payer: Self-pay

## 2024-02-05 ENCOUNTER — Ambulatory Visit (HOSPITAL_COMMUNITY): Payer: Self-pay

## 2024-02-05 ENCOUNTER — Ambulatory Visit (HOSPITAL_COMMUNITY)
Admission: EM | Admit: 2024-02-05 | Discharge: 2024-02-05 | Disposition: A | Discharge status: Home / Self Care | Payer: Self-pay | Source: Home / Self Care

## 2024-02-05 ENCOUNTER — Encounter (HOSPITAL_COMMUNITY): Payer: Self-pay | Admitting: Emergency Medicine

## 2024-02-05 DIAGNOSIS — S0001XA Abrasion of scalp, initial encounter: Secondary | ICD-10-CM

## 2024-02-05 DIAGNOSIS — M25512 Pain in left shoulder: Secondary | ICD-10-CM

## 2024-02-05 DIAGNOSIS — W108XXA Fall (on) (from) other stairs and steps, initial encounter: Secondary | ICD-10-CM

## 2024-02-05 MED ORDER — METHOCARBAMOL 750 MG PO TABS: 750.0000 mg | ORAL_TABLET | Freq: Four times a day (QID) | ORAL | 0 refills | Status: DC | PRN

## 2024-02-05 MED ORDER — IBUPROFEN 600 MG PO TABS: 600.0000 mg | ORAL_TABLET | Freq: Four times a day (QID) | ORAL | 0 refills | Status: AC | PRN

## 2024-02-05 NOTE — Discharge Instructions (Signed)
" °  Head injury You sustained a head injury but currently have no concerning neurologic symptoms, which is reassuring. Most minor head injuries improve with time. Rest and avoid strenuous physical activity for the next 24-48 hours. Avoid alcohol, sedating medications, or activities that increase risk of another head injury. Use acetaminophen  for headache if needed; avoid additional NSAIDs beyond what is prescribed for your shoulder unless directed. Go to the Emergency Department immediately if you develop: Worsening or severe headache Repeated vomiting or nausea that does not improve Confusion, trouble concentrating, or unusual behavior Slurred speech, vision changes, weakness, numbness, or imbalance Excessive sleepiness or difficulty waking  Shoulder injury Your shoulder X-ray did not show a fracture or dislocation, which is good. Pain is likely due to soft-tissue injury (strain, sprain, or contusion). Sling use Wear the sling for comfort, especially when upright or in public. Do not keep the shoulder completely immobilized. Remove the sling 3-5 times daily to gently move the shoulder. Frozen shoulder prevention Perform gentle range-of-motion exercises: pendulum swings, assisted forward lifting, and gentle stretching. Stop if you feel sharp pain, but mild discomfort is expected. Early movement helps prevent stiffness and long-term loss of motion. Medications Ibuprofen  600 mg every 6 hours with food to reduce pain and inflammation. Stop and notify the office if you develop stomach pain, black stools, or easy bruising. Methocarbamol  up to four times daily as needed for muscle spasm. This may cause drowsiness--avoid driving or operating machinery until you know how it affects you. Comfort measures Ice to the shoulder for 15-20 minutes, 2-3 times daily for the first 48 hours. After swelling improves, heat may help with muscle tightness. Follow-up Orthopedic follow-up is important, especially if  pain, weakness, numbness, or limited range of motion persists beyond a few days.  "

## 2024-02-05 NOTE — ED Triage Notes (Addendum)
 Patient reports her dog was on a leash, pulled patient down 5 steps  to her home.  Incident occurred yesterday evening around 4-4:30 pm.  Patient reports she has not taken anything for pain  Patient has a hematoma to left side of scalp.  Left shoulder pain.  Patient remembers being pulled down steps.  Says husband reports calling name fie or six times before she answered his calls.  Patient says she does not remember this.  Denies nausea or vomiting.  Patient has a headache.  Initially left ear was ringing-not now  Patient can move fingers and elbow, but definitely not her shoulder.

## 2024-02-05 NOTE — ED Notes (Signed)
 Notified Domenica , NP of patient complaint/presentation

## 2024-02-05 NOTE — ED Provider Notes (Addendum)
 " [facility]    CSN: [id number] Arrival date & time: 02/05/24  1258      History   Chief Complaint Chief Complaint  Patient presents with   Shoulder Pain    HPI Darlene Rodriguez is a 43 y.o. female.   Ms. Darlene Rodriguez presents today with complaint of injury that occurred yesterday.  She reports that her dog pulled her down 5  stairs.  Patient reports that the fall was witnessed by her husband.  She hit her head and lost consciousness for less than 5 minutes.  After she regained consciousness she had a low level headache, ringing in the ear, bleeding from her school, and left shoulder pain.  She reports that the tinnitus has resolved.  She continues to have a low-level headache.  She reports that she remembers all events leading up to the injury and after regaining consciousness.  She has not had any dizziness, weakness, confusion, difficulty walking or talking, nausea, vomiting, photophobia, phonophobia,  blurred vision, diplopia,, rhinorrhea.  She is not currently on a blood thinner.  She reports that since the accident she has had right shoulder pain.  She reports that at rest she has no pain, but with palpation or any movement she has 10 out of 10 shoulder pain that she describes as sharp and throbbing.  She denies any overlying bruising, but reports that it feels swollen.  She denies any pain or limited range of motion of her elbow wrist or hand.  She denies numbness, or tingling.  She has not taken anything over-the-counter for her pain.   The history is provided by the patient.  Shoulder Pain   Past Medical History:  Diagnosis Date   Diabetes [facility])    Hypertension     Patient Active Problem List   Diagnosis Date Noted   Diabetes mellitus (HCC) 01/15/2024   Sepsis due to cellulitis (HCC) 10/14/2023   Essential hypertension 02/09/2014   Pre-diabetes 02/05/2014   Acute parotitis 02/02/2014   Morbid obesity (HCC) 02/02/2014    Past Surgical History:  Procedure  Laterality Date   CESAREAN SECTION     INCISION AND DRAINAGE OF WOUND N/A 10/14/2023   Procedure: IRRIGATION AND DEBRIDEMENT WOUND;  Surgeon: Vernetta Berg, MD;  Location: [facility] OR;  Service: General;  Laterality: N/A;   TONSILLECTOMY      OB History   No obstetric history on file.      Home Medications    Prior to Admission medications  Medication Sig Start Date End Date Taking? Authorizing Provider  ibuprofen  (ADVIL ) 600 MG tablet Take 1 tablet (600 mg total) by mouth every 6 (six) hours as needed. 02/05/24  Yes Leatrice Vernell HERO, NP  methocarbamol  (ROBAXIN ) 750 MG tablet Take 1 tablet (750 mg total) by mouth 4 (four) times daily as needed for muscle spasms. 02/05/24  Yes Leatrice Vernell HERO, NP  acetaminophen  (TYLENOL ) 500 MG tablet Take 1,000 mg by mouth 2 (two) times daily as needed for moderate pain (pain score 4-6) or headache.    [provider]  Blood Glucose Monitoring Suppl DEVI 1 each by Does not apply route in the morning, at noon, and at bedtime. May substitute to any manufacturer covered by patient's insurance. 10/18/23   Leotis Bogus, MD  lisinopril  (ZESTRIL ) 10 MG tablet Take 1 tablet (10 mg total) by mouth daily. 01/15/24   de [place], Raymond J, MD  metFORMIN  (GLUCOPHAGE -XR) 500 MG 24 hr tablet Take 1 tablet (500 mg total) by mouth 2 (  two) times daily with a meal. 01/15/24   de [place], Quintin PARAS, MD  tirzepatide  (MOUNJARO ) 2.5 MG/0.5ML Pen Inject 2.5 mg into the skin once a week. Patient not taking: Reported on 01/27/2024 01/15/24   de [place], Quintin PARAS, MD    Family History Family History  Problem Relation Age of Onset   Lung cancer Father    Heart disease Maternal Grandfather     Social History Social History[1]   Allergies   Diphenhydramine   Review of Systems Review of Systems  Eyes:  Negative for photophobia and visual disturbance.  Gastrointestinal:  Negative for nausea and vomiting.  Musculoskeletal:  Positive for arthralgias and myalgias.   Skin:  Positive for wound.  Neurological:  Positive for dizziness and headaches. Negative for tremors, syncope, speech difficulty, weakness, light-headedness and numbness.     Physical Exam Triage Vital Signs ED Triage Vitals  Encounter Vitals Group     BP 02/05/24 1428 (!) 153/88     Girls Systolic BP Percentile --      Girls Diastolic BP Percentile --      Boys Systolic BP Percentile --      Boys Diastolic BP Percentile --      Pulse Rate 02/05/24 1428 (!) 102     Resp 02/05/24 1428 20     Temp 02/05/24 1428 98.1 F (36.7 C)     Temp Source 02/05/24 1428 Oral     SpO2 02/05/24 1428 98 %     Weight --      Height --      Head Circumference --      Peak Flow --      Pain Score 02/05/24 1424 3     Pain Loc --      Pain Education --      Exclude from Growth Chart --    No data found.  Updated Vital Signs BP (!) 153/88 (BP Location: Right Arm) Comment (BP Location): large cuff, forearm  Pulse (!) 102   Temp 98.1 F (36.7 C) (Oral)   Resp 20   LMP 01/25/2024 (Approximate)   SpO2 98%   Visual Acuity Right Eye Distance:   Left Eye Distance:   Bilateral Distance:    Right Eye Near:   Left Eye Near:    Bilateral Near:     Physical Exam Vitals and nursing note reviewed.  Constitutional:      General: She is not in acute distress.    Appearance: Normal appearance. She is normal weight. She is not toxic-appearing.  HENT:     Head: No raccoon eyes, Battle's sign, abrasion, contusion, masses or laceration.      Comments: Crusted abrasion with mild swelling on left sinus:    Left Ear: Tympanic membrane, ear canal and external ear normal.     Mouth/Throat:     Tonsils: 1+ on the right. 1+ on the left.  Eyes:     General: Lids are normal.     Extraocular Movements: Extraocular movements intact.     Right eye: Normal extraocular motion and no nystagmus.     Left eye: Normal extraocular motion and no nystagmus.     Conjunctiva/sclera: Conjunctivae normal.     Right  eye: Right conjunctiva is not injected. No hemorrhage.    Left eye: Left conjunctiva is not injected. No hemorrhage. Cardiovascular:     Rate and Rhythm: Normal rate and regular rhythm.     Pulses:  Radial pulses are 2+ on the right side and 2+ on the left side.       Dorsalis pedis pulses are 2+ on the right side and 2+ on the left side.       Posterior tibial pulses are 2+ on the right side and 2+ on the left side.     Heart sounds: Normal heart sounds.  Pulmonary:     Effort: Pulmonary effort is normal.     Breath sounds: Normal breath sounds.  Musculoskeletal:     Right shoulder: Normal.     Left shoulder: Tenderness (Diffuse severe shoulder tenderness to light palpation) and bony tenderness present. No swelling, deformity, effusion or laceration. Decreased range of motion (Unable to form range of motion due to pain).     Lumbar back: Decreased range of motion: Guarded due to pain. Negative right straight leg raise test and negative left straight leg raise test.     Comments: Unable to assess for crepitus due to pain  Lymphadenopathy:     Cervical:     Right cervical: No posterior cervical adenopathy.    Left cervical: No posterior cervical adenopathy.  Skin:    General: Skin is warm and dry.     Findings: No rash.  Neurological:     Mental Status: She is alert and oriented to person, place, and time.     GCS: GCS eye subscore is 4. GCS verbal subscore is 5. GCS motor subscore is 6.     Cranial Nerves: Cranial nerves 2-12 are intact.     Sensory: Sensation is intact.     Motor: Motor function is intact.     Coordination: Coordination is intact.     Gait: Gait is intact.  Psychiatric:        Mood and Affect: Mood normal.        Behavior: Behavior normal.      UC Treatments / Results  Labs (all labs ordered are listed, but only abnormal results are displayed) Labs Reviewed - No data to display  EKG   Radiology DG Shoulder Left Result Date: 02/05/2024 EXAM:  1 VIEW(S) XRAY OF THE LEFT SHOULDER 02/05/2024 03:54:44 PM COMPARISON: None available. CLINICAL HISTORY: Fall onto L shoulder. diffuse severe pain. Unable to move 2/2 pain FINDINGS: BONES AND JOINTS: Glenohumeral joint is normally aligned. No acute fracture. No malalignment. The [facility] joint is unremarkable. SOFT TISSUES: No abnormal calcifications. Visualized lung is unremarkable. IMPRESSION: 1. No acute fracture or dislocation. Electronically signed by: Morgane Naveau MD 02/05/2024 04:38 PM EST RP Workstation: [id number]    Procedures Procedures (including critical care time)  Medications Ordered in UC Medications - No data to display  Initial Impression / Assessment and Plan / UC Course  I have reviewed the triage vital signs and the nursing notes.  Pertinent labs & imaging results that were available during my care of the patient were reviewed by me and considered in my medical decision making (see chart for details).     Scalp abrasion Low suspicion for traumatic brain injury at this time.  [place] CT head rule 0.  Patient not having any neurological signs or symptoms.  She will continue to monitor at home.  For severe headache, confusion, difficulty walking or talking, vomiting more than twice, visual disturbance or any neurological symptoms she will go to the ER for further evaluation 2. Shoulder pain X-ray negative for fracture.  Arm placed in sling for comfort.  Advised patient to take breaks from sling throughout the  day to perform gentle shoulder range of motion in order to prevent then frozen shoulder.  Advised ibuprofen  600 mg every 6 hours for pain/inflammation as well as methocarbamol  750 mg 4 times daily as needed for muscle spasm/pain.  Ketorolac  injection offered in office, but patient declined.  If no improvement in pain and range of motion over next several days, she will follow-up with orthopedics Final Clinical Impressions(s) / UC Diagnoses   Final diagnoses:  Acute pain of  left shoulder  Fall down stairs, initial encounter  Abrasion, scalp w/o infection     Discharge Instructions       Head injury You sustained a head injury but currently have no concerning neurologic symptoms, which is reassuring. Most minor head injuries improve with time. Rest and avoid strenuous physical activity for the next 24-48 hours. Avoid alcohol, sedating medications, or activities that increase risk of another head injury. Use acetaminophen  for headache if needed; avoid additional NSAIDs beyond what is prescribed for your shoulder unless directed. Go to the Emergency Department immediately if you develop: Worsening or severe headache Repeated vomiting or nausea that does not improve Confusion, trouble concentrating, or unusual behavior Slurred speech, vision changes, weakness, numbness, or imbalance Excessive sleepiness or difficulty waking  Shoulder injury Your shoulder X-ray did not show a fracture or dislocation, which is good. Pain is likely due to soft-tissue injury (strain, sprain, or contusion). Sling use Wear the sling for comfort, especially when upright or in public. Do not keep the shoulder completely immobilized. Remove the sling 3-5 times daily to gently move the shoulder. Frozen shoulder prevention Perform gentle range-of-motion exercises: pendulum swings, assisted forward lifting, and gentle stretching. Stop if you feel sharp pain, but mild discomfort is expected. Early movement helps prevent stiffness and long-term loss of motion. Medications Ibuprofen  600 mg every 6 hours with food to reduce pain and inflammation. Stop and notify the office if you develop stomach pain, black stools, or easy bruising. Methocarbamol  up to four times daily as needed for muscle spasm. This may cause drowsiness--avoid driving or operating machinery until you know how it affects you. Comfort measures Ice to the shoulder for 15-20 minutes, 2-3 times daily for the first 48  hours. After swelling improves, heat may help with muscle tightness. Follow-up Orthopedic follow-up is important, especially if pain, weakness, numbness, or limited range of motion persists beyond a few days.      ED Prescriptions     Medication Sig Dispense Auth. Provider   ibuprofen  (ADVIL ) 600 MG tablet Take 1 tablet (600 mg total) by mouth every 6 (six) hours as needed. 30 tablet Leatrice Vernell HERO, NP   methocarbamol  (ROBAXIN ) 750 MG tablet Take 1 tablet (750 mg total) by mouth 4 (four) times daily as needed for muscle spasms. 28 tablet Leatrice Vernell HERO, NP      PDMP not reviewed this encounter.    Leatrice Vernell HERO, NP 02/05/24 1624    Leatrice Vernell HERO, NP 02/05/24 1643     [1]  Social History Tobacco Use   Smoking status: Never    Passive exposure: Never   Smokeless tobacco: Never  Vaping Use   Vaping status: Never Used  Substance Use Topics   Alcohol use: No   Drug use: No     Leatrice Vernell HERO, NP 02/05/24 1646  "

## 2024-02-19 NOTE — Telephone Encounter (Signed)
 Received provider portion Novo [place] ([place]) from provider office faxed to [employer] .

## 2024-02-21 ENCOUNTER — Telehealth (HOSPITAL_BASED_OUTPATIENT_CLINIC_OR_DEPARTMENT_OTHER): Payer: Self-pay | Admitting: Family Medicine

## 2024-02-21 NOTE — Telephone Encounter (Signed)
 Copied from CRM [id number]. Topic: General - Other >> Feb 21, 2024 11:32 AM Montie POUR wrote: Reason for CRM:  Geni from [employer] [phone]) is calling to see if Dr everitt Cuba received the form to verify that Ms. Heffner needs a service pet. Please call her to discuss. Thanks

## 2024-02-24 NOTE — Telephone Encounter (Unsigned)
 Copied from CRM [id number]. Topic: Clinical - Medical Advice >> Feb 24, 2024  1:57 PM Harlene ORN wrote: Reason for CRM: [place]ond[place] - [employer]  Sent a fax for an emotional support animal request. Sent on 01/15. Checking on the status update.  Phone: [phone] fax: [phone]

## 2024-02-27 NOTE — Telephone Encounter (Signed)
 Received approval letter from [employer](Ozempic)thru 02/14/2025,approval letter index.

## 2024-03-04 ENCOUNTER — Telehealth (HOSPITAL_BASED_OUTPATIENT_CLINIC_OR_DEPARTMENT_OTHER): Payer: Self-pay | Admitting: Family Medicine

## 2024-03-04 ENCOUNTER — Ambulatory Visit (INDEPENDENT_AMBULATORY_CARE_PROVIDER_SITE_OTHER): Payer: Self-pay | Admitting: Family Medicine

## 2024-03-04 ENCOUNTER — Encounter (HOSPITAL_BASED_OUTPATIENT_CLINIC_OR_DEPARTMENT_OTHER): Payer: Self-pay | Admitting: Family Medicine

## 2024-03-04 VITALS — BP 155/98 | HR 91 | Temp 97.7°F | Resp 20 | Ht 62.0 in | Wt 322.0 lb

## 2024-03-04 DIAGNOSIS — Z7984 Long term (current) use of oral hypoglycemic drugs: Secondary | ICD-10-CM

## 2024-03-04 DIAGNOSIS — E1165 Type 2 diabetes mellitus with hyperglycemia: Secondary | ICD-10-CM

## 2024-03-04 DIAGNOSIS — J019 Acute sinusitis, unspecified: Secondary | ICD-10-CM

## 2024-03-04 DIAGNOSIS — I10 Essential (primary) hypertension: Secondary | ICD-10-CM

## 2024-03-04 DIAGNOSIS — J329 Chronic sinusitis, unspecified: Secondary | ICD-10-CM | POA: Insufficient documentation

## 2024-03-04 MED ORDER — AMOXICILLIN 875 MG PO TABS: 875.0000 mg | ORAL_TABLET | Freq: Two times a day (BID) | ORAL | 0 refills | Status: AC

## 2024-03-04 MED ORDER — METFORMIN HCL ER 500 MG PO TB24: 500.0000 mg | ORAL_TABLET | Freq: Two times a day (BID) | ORAL | 1 refills | Status: AC

## 2024-03-04 NOTE — Assessment & Plan Note (Signed)
 Patient continues with metformin , denies any issues with medication.  She is still in process of getting approval and prescription for GLP-1.  Previously, we did complete patient assistance forms for her to obtain approval for patient assistance from manufacturer.  She plans to start with Trulicity.  We again reviewed medication and discussed potential side effects to be mindful of Recent A1c slightly above goal at 8.7%.  We will plan to check this in the future after having started with Trulicity Check urine ACR later this year and complete foot exam at future visit

## 2024-03-04 NOTE — Progress Notes (Signed)
" ° ° °  Procedures performed today:    None.  Independent interpretation of notes and tests performed by another provider:   None.  Brief History, Exam, Impression, and Recommendations:    BP (!) 155/98 (BP Location: Left Arm, Patient Position: Sitting, Cuff Size: Large)   Pulse 91   Temp 97.7 F (36.5 C) (Oral)   Resp 20   Ht 5' 2 (1.575 m)   Wt (!) 322 lb (146.1 kg)   LMP 01/25/2024 (Approximate)   SpO2 100%   BMI 58.89 kg/m   Type 2 diabetes mellitus with hyperglycemia, without long-term current use of insulin  [facility]) Assessment & Plan: Patient continues with metformin , denies any issues with medication.  She is still in process of getting approval and prescription for GLP-1.  Previously, we did complete patient assistance forms for her to obtain approval for patient assistance from manufacturer.  She plans to start with Trulicity.  We again reviewed medication and discussed potential side effects to be mindful of Recent A1c slightly above goal at 8.7%.  We will plan to check this in the future after having started with Trulicity Check urine ACR later this year and complete foot exam at future visit  Orders: -     metFORMIN  HCl ER; Take 1 tablet (500 mg total) by mouth 2 (two) times daily with a meal.  Dispense: 180 tablet; Refill: 1  Essential hypertension Assessment & Plan: Blood pressure elevated in office, similar to last visit.  Again, she reports that she does check her blood pressure regularly at home and the blood pressures at home have been better controlled.  She reports that this morning her blood pressure was 145/82.  No current issues with chest pain or headaches. Can continue with current medication regimen.  Advised on bringing home blood pressure cuff to next appointment so that we can compare to our cuff here in the office Recommend intermittent monitoring of blood pressure at home, DASH diet   Acute non-recurrent sinusitis, unspecified location Assessment &  Plan: Patient reports that she has been having some sinus congestion and pressure for about 1 week.  She has been using OTC medications without significant relief.  No issues with fevers.  No shortness of breath or trouble breathing. We discussed consideration, recommend continuing with conservative measures.  Also discussed utilizing Flonase , nasal saline spray.  She has been using Afrin, cautioned on using this longer than 3 days. Discussed that in some cases, initial viral infection can transition to bacterial infection.  Given current duration of symptoms, prescription for antibiotics sent to pharmacy for patient to start if symptoms do persist over the coming days or if any increased sinus pressure, congestion or development of fevers.  Discussed that if she does start with antibiotic, advised to finish entire prescription.   Other orders -     Amoxicillin ; Take 1 tablet (875 mg total) by mouth 2 (two) times daily for 7 days.  Dispense: 14 tablet; Refill: 0  Return in about 2 months (around 05/02/2024) for hypertension, diabetes, med check.   ___________________________________________ Darlene Cayson de [place], MD, ABFM, CAQSM Primary Care and Sports Medicine [facility] "

## 2024-03-04 NOTE — Telephone Encounter (Signed)
 Received pt's Ozempic from [employer] pt assistance. Called and spoke with pt letting her know this did arrive and that it was in the fridge at the office when she was able to come pick it up. Understanding was verbalized. Nothing further needed.

## 2024-03-04 NOTE — Assessment & Plan Note (Signed)
 Blood pressure elevated in office, similar to last visit.  Again, she reports that she does check her blood pressure regularly at home and the blood pressures at home have been better controlled.  She reports that this morning her blood pressure was 145/82.  No current issues with chest pain or headaches. Can continue with current medication regimen.  Advised on bringing home blood pressure cuff to next appointment so that we can compare to our cuff here in the office Recommend intermittent monitoring of blood pressure at home, DASH diet

## 2024-03-04 NOTE — Assessment & Plan Note (Signed)
 Patient reports that she has been having some sinus congestion and pressure for about 1 week.  She has been using OTC medications without significant relief.  No issues with fevers.  No shortness of breath or trouble breathing. We discussed consideration, recommend continuing with conservative measures.  Also discussed utilizing Flonase , nasal saline spray.  She has been using Afrin, cautioned on using this longer than 3 days. Discussed that in some cases, initial viral infection can transition to bacterial infection.  Given current duration of symptoms, prescription for antibiotics sent to pharmacy for patient to start if symptoms do persist over the coming days or if any increased sinus pressure, congestion or development of fevers.  Discussed that if she does start with antibiotic, advised to finish entire prescription.

## 2024-03-05 ENCOUNTER — Telehealth (HOSPITAL_BASED_OUTPATIENT_CLINIC_OR_DEPARTMENT_OTHER): Payer: Self-pay

## 2024-03-05 NOTE — Telephone Encounter (Signed)
 Patient came by [facility] to pick up Ozempic that was delivered to clinic.

## 2024-05-04 ENCOUNTER — Ambulatory Visit (HOSPITAL_BASED_OUTPATIENT_CLINIC_OR_DEPARTMENT_OTHER): Payer: Self-pay | Admitting: Family Medicine
